# Patient Record
Sex: Female | Born: 1975 | Race: Black or African American | Hispanic: No | Marital: Married | Smoking: Current every day smoker
Health system: Southern US, Community
[De-identification: ages and names within clinical notes are randomized; demographics above are authoritative.]

## PROBLEM LIST (undated history)

## (undated) DIAGNOSIS — J45909 Unspecified asthma, uncomplicated: Secondary | ICD-10-CM

## (undated) DIAGNOSIS — Z8669 Personal history of other diseases of the nervous system and sense organs: Secondary | ICD-10-CM

## (undated) DIAGNOSIS — I110 Hypertensive heart disease with heart failure: Secondary | ICD-10-CM

## (undated) DIAGNOSIS — Z720 Tobacco use: Secondary | ICD-10-CM

## (undated) DIAGNOSIS — J449 Chronic obstructive pulmonary disease, unspecified: Secondary | ICD-10-CM

## (undated) DIAGNOSIS — F329 Major depressive disorder, single episode, unspecified: Secondary | ICD-10-CM

## (undated) DIAGNOSIS — G4733 Obstructive sleep apnea (adult) (pediatric): Secondary | ICD-10-CM

## (undated) DIAGNOSIS — L03115 Cellulitis of right lower limb: Secondary | ICD-10-CM

## (undated) DIAGNOSIS — I5042 Chronic combined systolic (congestive) and diastolic (congestive) heart failure: Secondary | ICD-10-CM

## (undated) DIAGNOSIS — N184 Chronic kidney disease, stage 4 (severe): Secondary | ICD-10-CM

## (undated) DIAGNOSIS — F32A Depression, unspecified: Secondary | ICD-10-CM

## (undated) DIAGNOSIS — D649 Anemia, unspecified: Secondary | ICD-10-CM

## (undated) DIAGNOSIS — Z9981 Dependence on supplemental oxygen: Secondary | ICD-10-CM

## (undated) HISTORY — DX: Chronic kidney disease, stage 4 (severe): N18.4

## (undated) HISTORY — DX: Hypertensive heart disease with heart failure: I11.0

## (undated) HISTORY — DX: Tobacco use: Z72.0

## (undated) HISTORY — PX: NO PAST SURGERIES: SHX2092

## (undated) HISTORY — DX: Personal history of other diseases of the nervous system and sense organs: Z86.69

## (undated) HISTORY — DX: Chronic combined systolic (congestive) and diastolic (congestive) heart failure: I50.42

---

## 2014-05-26 ENCOUNTER — Emergency Department (HOSPITAL_COMMUNITY)
Admission: EM | Admit: 2014-05-26 | Discharge: 2014-05-26 | Disposition: A | Discharge status: Home / Self Care | Payer: No Typology Code available for payment source | Attending: Emergency Medicine | Admitting: Emergency Medicine

## 2014-05-26 ENCOUNTER — Emergency Department (HOSPITAL_COMMUNITY): Payer: No Typology Code available for payment source

## 2014-05-26 ENCOUNTER — Encounter (HOSPITAL_COMMUNITY): Payer: Self-pay | Admitting: Emergency Medicine

## 2014-05-26 DIAGNOSIS — J159 Unspecified bacterial pneumonia: Secondary | ICD-10-CM | POA: Insufficient documentation

## 2014-05-26 DIAGNOSIS — N189 Chronic kidney disease, unspecified: Secondary | ICD-10-CM | POA: Diagnosis not present

## 2014-05-26 DIAGNOSIS — R059 Cough, unspecified: Secondary | ICD-10-CM

## 2014-05-26 DIAGNOSIS — I129 Hypertensive chronic kidney disease with stage 1 through stage 4 chronic kidney disease, or unspecified chronic kidney disease: Secondary | ICD-10-CM | POA: Insufficient documentation

## 2014-05-26 DIAGNOSIS — R05 Cough: Secondary | ICD-10-CM | POA: Diagnosis present

## 2014-05-26 DIAGNOSIS — Z8739 Personal history of other diseases of the musculoskeletal system and connective tissue: Secondary | ICD-10-CM | POA: Diagnosis not present

## 2014-05-26 DIAGNOSIS — H748X1 Other specified disorders of right middle ear and mastoid: Secondary | ICD-10-CM | POA: Insufficient documentation

## 2014-05-26 DIAGNOSIS — J189 Pneumonia, unspecified organism: Secondary | ICD-10-CM

## 2014-05-26 DIAGNOSIS — Z720 Tobacco use: Secondary | ICD-10-CM | POA: Insufficient documentation

## 2014-05-26 MED ORDER — AZITHROMYCIN 250 MG PO TABS: 250.0000 mg | ORAL_TABLET | Freq: Every day | ORAL | Status: DC

## 2014-05-26 MED ORDER — ALBUTEROL SULFATE HFA 108 (90 BASE) MCG/ACT IN AERS
1.0000 | INHALATION_SPRAY | RESPIRATORY_TRACT | Status: DC | PRN
  Administered 2014-05-26: 2 via RESPIRATORY_TRACT
  Filled 2014-05-26: qty 6.7

## 2014-05-26 MED ORDER — ACETAMINOPHEN 325 MG PO TABS
650.0000 mg | ORAL_TABLET | Freq: Once | ORAL | Status: AC
  Administered 2014-05-26: 650 mg via ORAL
  Filled 2014-05-26: qty 2

## 2014-05-26 NOTE — ED Notes (Signed)
Patient coming from home with c/o of fever, cough, diaphoretic, and SOB x 2 days.  Patient states she has been taking care of husband at bedside who was diagnosed with pnuemonia.

## 2014-05-26 NOTE — ED Provider Notes (Signed)
CSN: [id number]     Arrival date & time 05/26/14  0549 History   First MD Initiated Contact with Patient 05/26/14 0559     Chief Complaint  Patient presents with  . Influenza     (Consider location/radiation/quality/duration/timing/severity/associated sxs/prior Treatment) Patient is a 39 y.o. female presenting with flu symptoms. The history is provided by the patient and medical records.  Influenza Presenting symptoms: cough and shortness of breath     This is a 39 y.o. F with PMH significant for HTN, CKD, arthritis, presenting to the ED for complaint of productive cough, subjective fever, and mild, intermittent SOB for the past 2 days.  Patient's husband was recently diagnosed with CAP 2 days ago and she feels she picked it up from him.  She denies any chest pain.  She denies abdominal pain, nausea, vomiting, or diarrhea.  Patient also admits that she has not taken her BP meds in 2 days because she was not feeling well.  States now she has a headache.  Denies dizziness, lightheadedness, numbness, paresthesias, or focal weakness.    Past Medical History  Diagnosis Date  . Hypertension   . Renal disorder   . Arthritis    History reviewed. No pertinent past surgical history. No family history on file. History  Substance Use Topics  . Smoking status: Current Every Day Smoker  . Smokeless tobacco: Not on file  . Alcohol Use: No   OB History    No data available     Review of Systems  Respiratory: Positive for cough and shortness of breath.   All other systems reviewed and are negative.     Allergies  Review of patient's allergies indicates no known allergies.  Home Medications   Prior to Admission medications   Not on File   BP 171/109 mmHg  Pulse 105  Temp(Src) 99.1 F (37.3 C) (Oral)  Resp 15  Ht  (1.626 m)  Wt 210 lb (95.255 kg)  BMI 36.03 kg/m2  SpO2 98%  LMP 05/25/2014   Physical Exam  Constitutional: She is oriented to person, place, and time. She  appears well-developed and well-nourished. No distress.  HENT:  Head: Normocephalic and atraumatic.  Right Ear: Ear canal normal. A middle ear effusion is present.  Left Ear: Tympanic membrane and ear canal normal.  Nose: Mucosal edema and rhinorrhea (clear) present.  Mouth/Throat: Uvula is midline, oropharynx is clear and moist and mucous membranes are normal. No oropharyngeal exudate, posterior oropharyngeal edema, posterior oropharyngeal erythema or tonsillar abscesses.  Eyes: Conjunctivae and EOM are normal. Pupils are equal, round, and reactive to light.  Neck: Normal range of motion and full passive range of motion without pain. Neck supple. No rigidity.  No nuchal rigidity, no meningismus  Cardiovascular: Normal rate, regular rhythm and normal heart sounds.   Pulmonary/Chest: Effort normal and breath sounds normal. No respiratory distress. She has no wheezes.  Abdominal: Soft. Bowel sounds are normal. There is no tenderness. There is no guarding.  Musculoskeletal: Normal range of motion. She exhibits no edema.  Neurological: She is alert and oriented to person, place, and time.  AAOx3, answering questions appropriately; equal strength UE and LE bilaterally; CN grossly intact; moves all extremities appropriately without ataxia; no focal neuro deficits or facial asymmetry appreciated  Skin: Skin is warm and dry. She is not diaphoretic.  Psychiatric: She has a normal mood and affect.  Nursing note and vitals reviewed.   ED Course  Procedures (including critical care time) Labs Review  Labs Reviewed - No data to display  Imaging Review Dg Chest 2 View  05/26/2014   CLINICAL DATA:  Fever, nonproductive cough, chest pain for 2 days. History of hypertension renal disorder.  EXAM: CHEST  2 VIEW  COMPARISON:  None.  FINDINGS: The cardiac silhouette is upper limits of normal in size, mediastinal silhouette is nonsuspicious. Patchy RIGHT middle lobe airspace opacity without pleural effusion.  No pneumothorax. Soft tissue planes and included osseous structures are nonsuspicious.  IMPRESSION: RIGHT middle lobe pneumonia.  Mild cardiomegaly.   Electronically Signed   By: Awilda Metroourtnay  Bloomer   On: 05/26/2014 06:53     EKG Interpretation None      MDM   Final diagnoses:  Cough  CAP (community acquired pneumonia)   39 year old female with complaint of cough and subjective fever at home.  No chest pain.  Her husband was recently diagnosed with pneumonia and she is concerned for the same. Also has headache, has not taken BP meds in 2 days due to not feeling well.  She is afebrile and nontoxic on exam. Headache without focal deficits, doubt acute intracranial pathology.  Her lung sounds are overall clear and she is in no acute respiratory distress. She denies any chest pain. Chest x-ray does reveal right middle lobe pneumonia.  She'll be started on azithromycin and given an albuterol inhaler for rescue. Will use home nebulizers every 4 hours as needed for SOB/wheezing.  Strongly recommended to take her BP meds as directed. Discussed plan with patient, he/she acknowledged understanding and agreed with plan of care.  Return precautions given for new or worsening symptoms.  Garlon HatchetLisa M Sanders, PA-C 05/26/14 [id number]  Marisa Severinlga Otter, MD 05/26/14 1910

## 2014-05-26 NOTE — ED Notes (Signed)
Patient alert and oriented at discharge.  Patient advised to follow up with primary care physician regarding blood pressure and to take her medication as prescribed.

## 2014-05-26 NOTE — Discharge Instructions (Signed)
Take the prescribed medication as directed.  Use nebulizer as needed every 4 hours for shortness of breath/wheezing.  Use inhaler for rescue. Return to the ED for new or worsening symptoms.

## 2014-08-20 ENCOUNTER — Encounter (HOSPITAL_COMMUNITY): Payer: Self-pay | Admitting: Family Medicine

## 2014-08-20 ENCOUNTER — Emergency Department (HOSPITAL_COMMUNITY)
Admission: EM | Admit: 2014-08-20 | Discharge: 2014-08-20 | Disposition: A | Discharge status: Home / Self Care | Payer: No Typology Code available for payment source | Attending: Emergency Medicine | Admitting: Emergency Medicine

## 2014-08-20 DIAGNOSIS — J45909 Unspecified asthma, uncomplicated: Secondary | ICD-10-CM | POA: Insufficient documentation

## 2014-08-20 DIAGNOSIS — J45901 Unspecified asthma with (acute) exacerbation: Secondary | ICD-10-CM | POA: Insufficient documentation

## 2014-08-20 DIAGNOSIS — I10 Essential (primary) hypertension: Secondary | ICD-10-CM

## 2014-08-20 DIAGNOSIS — Z79899 Other long term (current) drug therapy: Secondary | ICD-10-CM | POA: Insufficient documentation

## 2014-08-20 DIAGNOSIS — Z720 Tobacco use: Secondary | ICD-10-CM | POA: Insufficient documentation

## 2014-08-20 DIAGNOSIS — Z8739 Personal history of other diseases of the musculoskeletal system and connective tissue: Secondary | ICD-10-CM | POA: Diagnosis not present

## 2014-08-20 DIAGNOSIS — Z87448 Personal history of other diseases of urinary system: Secondary | ICD-10-CM | POA: Insufficient documentation

## 2014-08-20 DIAGNOSIS — R079 Chest pain, unspecified: Secondary | ICD-10-CM

## 2014-08-20 DIAGNOSIS — Z792 Long term (current) use of antibiotics: Secondary | ICD-10-CM | POA: Diagnosis not present

## 2014-08-20 DIAGNOSIS — R0602 Shortness of breath: Secondary | ICD-10-CM | POA: Diagnosis present

## 2014-08-20 HISTORY — DX: Unspecified asthma, uncomplicated: J45.909

## 2014-08-20 MED ORDER — PREDNISONE 20 MG PO TABS
60.0000 mg | ORAL_TABLET | Freq: Once | ORAL | Status: AC
  Administered 2014-08-20: 60 mg via ORAL
  Filled 2014-08-20: qty 3

## 2014-08-20 MED ORDER — IPRATROPIUM-ALBUTEROL 0.5-2.5 (3) MG/3ML IN SOLN
RESPIRATORY_TRACT | Status: AC
  Filled 2014-08-20: qty 3

## 2014-08-20 MED ORDER — ALBUTEROL SULFATE (2.5 MG/3ML) 0.083% IN NEBU
5.0000 mg | INHALATION_SOLUTION | Freq: Once | RESPIRATORY_TRACT | Status: AC
  Administered 2014-08-20: 5 mg via RESPIRATORY_TRACT

## 2014-08-20 MED ORDER — LISINOPRIL 20 MG PO TABS: 20.0000 mg | ORAL_TABLET | Freq: Every day | ORAL | Status: DC

## 2014-08-20 MED ORDER — PREDNISONE 50 MG PO TABS: ORAL_TABLET | ORAL | Status: DC

## 2014-08-20 NOTE — ED Provider Notes (Signed)
CSN: [id number]     Arrival date & time 08/20/14  1431 History  This chart was scribed for Zadie Rhine, MD by Leone Payor, ED Scribe. This patient was seen in room TR02C/TR02C and the patient's care was started 3:25 PM.     Chief Complaint  Patient presents with  . Asthma   The history is provided by the patient. No language interpreter was used.     HPI Comments: April [name] is a 39 y.o. female with past medical history of asthma and HTN who presents to the Emergency Department complaining of constant, unchanged SOB with associated cough that began yesterday. She reports similar asthma-related episodes in the past. She has used her inhaler without significant relief. Patient received a breathing treatment in the ED which provided relief. She reports having a history of HTN but has run out of her prescribed Lisinopril. She denies chest pain, fever, HA.   Past Medical History  Diagnosis Date  . Hypertension   . Renal disorder   . Arthritis   . Asthma    History reviewed. No pertinent past surgical history. History reviewed. No pertinent family history. Social History  Substance Use Topics  . Smoking status: Current Every Day Smoker  . Smokeless tobacco: None  . Alcohol Use: No   OB History    No data available     Review of Systems  Constitutional: Negative for fever.  Respiratory: Positive for cough and shortness of breath.   Cardiovascular: Negative for chest pain and leg swelling.  Neurological: Negative for headaches.  All other systems reviewed and are negative.     Allergies  Review of patient's allergies indicates no known allergies.  Home Medications   Prior to Admission medications   Medication Sig Start Date End Date Taking? Authorizing Provider  albuterol (PROVENTIL HFA;VENTOLIN HFA) 108 (90 BASE) MCG/ACT inhaler Inhale 1 puff into the lungs every 6 (six) hours as needed for wheezing or shortness of breath.    Historical Provider, MD  AMLODIPINE  BESYLATE PO Take 1 tablet by mouth daily.    Historical Provider, MD  azithromycin (ZITHROMAX) 250 MG tablet Take 1 tablet (250 mg total) by mouth daily. Take first 2 tablets together, then 1 every day until finished. 05/26/14   Garlon Hatchet, PA-C  BENAZEPRIL HCL PO Take 1 tablet by mouth daily.    Historical Provider, MD  LISINOPRIL PO Take 1 tablet by mouth daily.    Historical Provider, MD   BP 196/131 mmHg  Pulse 95  Temp(Src) 97.8 F (36.6 C)  Resp 20  SpO2 98%  LMP 08/18/2014 Physical Exam  Nursing note and vitals reviewed.  CONSTITUTIONAL: Well developed/well nourished HEAD: Normocephalic/atraumatic EYES: EOMI/PERRL ENMT: Mucous membranes moist NECK: supple no meningeal signs CV: S1/S2 noted, no murmurs/rubs/gallops noted LUNGS: Lungs are clear to auscultation bilaterally, no apparent distress ABDOMEN: soft, nontender, no rebound or guarding, bowel sounds noted throughout abdomen NEURO: Pt is awake/alert/appropriate, moves all extremitiesx4.  No facial droop.   EXTREMITIES: pulses normal/equal, full ROM, no LE edema noted.  SKIN: warm, color normal PSYCH: no abnormalities of mood noted, alert and oriented to situation   ED Course  Procedures   DIAGNOSTIC STUDIES: Oxygen Saturation is 98% on RA, normal by my interpretation.    COORDINATION OF CARE: 3:30 PM Will discharge home with prednisone and lisinopril. Discussed treatment plan with pt at bedside and pt agreed to plan.   Pt reports symptoms similar to prior episodes of asthma She is improved and  wheeze resolved No edema or wt gain present to suggest CHF  Advised need take her BP meds and she was given Rx for lisinopril (she is supposed to take this) Advised need for outpatient f/u   MDM   Final diagnoses:  Asthma attack  Essential hypertension    Nursing notes including past medical history and social history reviewed and considered in documentation   I personally performed the services described in  this documentation, which was scribed in my presence. The recorded information has been reviewed and is accurate.        Zadie Rhine, MD 08/20/14 [phone]

## 2014-08-20 NOTE — ED Notes (Signed)
Pt here for SOB related to asthma. sts cough. Pt hypertensive at triage. sts she hasn't had her medication in a few months.

## 2014-09-03 ENCOUNTER — Ambulatory Visit: Payer: No Typology Code available for payment source | Attending: Family Medicine | Admitting: Family Medicine

## 2014-09-03 ENCOUNTER — Encounter: Payer: Self-pay | Admitting: Family Medicine

## 2014-09-03 VITALS — BP 181/137 | HR 90 | Temp 98.0°F | Ht 63.0 in | Wt 196.0 lb

## 2014-09-03 DIAGNOSIS — I129 Hypertensive chronic kidney disease with stage 1 through stage 4 chronic kidney disease, or unspecified chronic kidney disease: Secondary | ICD-10-CM | POA: Diagnosis not present

## 2014-09-03 DIAGNOSIS — F17210 Nicotine dependence, cigarettes, uncomplicated: Secondary | ICD-10-CM | POA: Insufficient documentation

## 2014-09-03 DIAGNOSIS — N189 Chronic kidney disease, unspecified: Secondary | ICD-10-CM | POA: Diagnosis not present

## 2014-09-03 DIAGNOSIS — I5042 Chronic combined systolic (congestive) and diastolic (congestive) heart failure: Secondary | ICD-10-CM | POA: Diagnosis not present

## 2014-09-03 DIAGNOSIS — I10 Essential (primary) hypertension: Secondary | ICD-10-CM | POA: Diagnosis not present

## 2014-09-03 DIAGNOSIS — N183 Chronic kidney disease, stage 3 unspecified: Secondary | ICD-10-CM

## 2014-09-03 DIAGNOSIS — J45909 Unspecified asthma, uncomplicated: Secondary | ICD-10-CM | POA: Diagnosis not present

## 2014-09-03 DIAGNOSIS — I509 Heart failure, unspecified: Secondary | ICD-10-CM | POA: Insufficient documentation

## 2014-09-03 DIAGNOSIS — J4540 Moderate persistent asthma, uncomplicated: Secondary | ICD-10-CM

## 2014-09-03 DIAGNOSIS — Z79899 Other long term (current) drug therapy: Secondary | ICD-10-CM | POA: Diagnosis not present

## 2014-09-03 LAB — COMPREHENSIVE METABOLIC PANEL
ALBUMIN: 4.2 g/dL (ref 3.6–5.1)
ALK PHOS: 68 U/L (ref 33–115)
ALT: 14 U/L (ref 6–29)
AST: 22 U/L (ref 10–30)
BILIRUBIN TOTAL: 0.5 mg/dL (ref 0.2–1.2)
BUN: 12 mg/dL (ref 7–25)
CALCIUM: 9.1 mg/dL (ref 8.6–10.2)
CO2: 24 mmol/L (ref 20–31)
Chloride: 104 mmol/L (ref 98–110)
Creat: 1.72 mg/dL — ABNORMAL HIGH (ref 0.50–1.10)
Glucose, Bld: 79 mg/dL (ref 65–99)
Potassium: 5 mmol/L (ref 3.5–5.3)
Sodium: 140 mmol/L (ref 135–146)
Total Protein: 7 g/dL (ref 6.1–8.1)

## 2014-09-03 MED ORDER — CLONIDINE HCL 0.1 MG PO TABS
0.1000 mg | ORAL_TABLET | Freq: Once | ORAL | Status: AC
  Administered 2014-09-03: 0.1 mg via ORAL

## 2014-09-03 MED ORDER — CARVEDILOL 12.5 MG PO TABS: 12.5000 mg | ORAL_TABLET | Freq: Two times a day (BID) | ORAL | Status: DC

## 2014-09-03 MED ORDER — FLUTICASONE-SALMETEROL 250-50 MCG/DOSE IN AEPB: 1.0000 | INHALATION_SPRAY | Freq: Two times a day (BID) | RESPIRATORY_TRACT | Status: DC

## 2014-09-03 NOTE — Patient Instructions (Signed)

## 2014-09-03 NOTE — Progress Notes (Signed)
Patient here to establish care She recently seen in ED for asthma attach She states her nebulizer machine is no longer helping her She needs refills on medications today

## 2014-09-03 NOTE — Progress Notes (Signed)
April [name], is a 39 y.o. female  ZOX:[id number]  [id number]  DOB - [birth year]  CC:  Chief Complaint  Patient presents with  . Follow-up  . Asthma       HPI: April [name] is a 39 y.o. female here today to establish medical care. Past medical history is notable for hypertension, chronic kidney disease, asthma, CHF and she recently relocated from [place], [place]. She had presented to the ED with shortness of breath and a cough for which she received a breathing treatment with improvement in symptoms. Her blood pressure was also noted to be significantly elevated at 196/131 as she had run out of her antihypertensives and had received a refill on medications.  Today she reports taking her antihypertensives but her blood pressure is still elevated; she also complains of shortness of breath which is not improved despite her compliance with Qvar and her rescue inhaler. She endorses occasional pedal edema which is absent at this time. She informs me that back in [place] she was seeing a nephrologist and a cardiologist.  Patient has No headache, No chest pain, No abdominal pain - No Nausea, No new weakness tingling or numbness.  No Known Allergies Past Medical History  Diagnosis Date  . Hypertension   . Renal disorder   . Arthritis   . Asthma    Current Outpatient Prescriptions on File Prior to Visit  Medication Sig Dispense Refill  . albuterol (PROVENTIL HFA;VENTOLIN HFA) 108 (90 BASE) MCG/ACT inhaler Inhale 1 puff into the lungs every 6 (six) hours as needed for wheezing or shortness of breath.    . AMLODIPINE BESYLATE PO Take 1 tablet by mouth daily.    Marland Kitchen BENAZEPRIL HCL PO Take 1 tablet by mouth daily.    Marland Kitchen lisinopril (PRINIVIL,ZESTRIL) 20 MG tablet Take 1 tablet (20 mg total) by mouth daily. 30 tablet 0  . predniSONE (DELTASONE) 50 MG tablet One tablet PO daily for 4 days 4 tablet 0   No current facility-administered medications on file prior to visit.    Family History  Problem Relation Age of Onset  . Diabetes Mother   . Hypertension Mother   . Cancer Father    Social History   Social History  . Marital Status: Married    Spouse Name: N/A  . Number of Children: N/A  . Years of Education: N/A   Occupational History  . Not on file.   Social History Main Topics  . Smoking status: Current Every Day Smoker -- 0.50 packs/day  . Smokeless tobacco: Not on file  . Alcohol Use: No  . Drug Use: No  . Sexual Activity: Not on file   Other Topics Concern  . Not on file   Social History Narrative    Review of Systems: Constitutional: Negative for fever, chills, diaphoresis, activity change, appetite change and fatigue. HENT: Negative for ear pain, nosebleeds, congestion, facial swelling, rhinorrhea, neck pain, neck stiffness and ear discharge.  Eyes: Negative for pain, discharge, redness, itching and visual disturbance. Respiratory: Positive for cough and shortness of breath, negative for choking, chest tightness, wheezing and stridor.  Cardiovascular: Negative for chest pain, palpitations and leg swelling. Gastrointestinal: Negative for abdominal distention. Genitourinary: Negative for dysuria, urgency, frequency, hematuria, flank pain, decreased urine volume, difficulty urinating and dyspareunia.  Musculoskeletal: Negative for back pain, joint swelling, arthralgia and gait problem. Neurological: Negative for dizziness, tremors, seizures, syncope, facial asymmetry, speech difficulty, weakness, light-headedness, numbness and headaches.  Hematological: Negative for adenopathy. Does not bruise/bleed easily. Skin:  Negative for rash, ulcer. Psychiatric/Behavioral: Negative for hallucinations, behavioral problems, confusion, dysphoric mood, decreased concentration and agitation.    Objective:   Filed Vitals:   09/03/14 1556  BP: 175/125  Pulse: 90  Temp: 98 F (36.7 C)    Physical Exam: Constitutional: Patient appears  well-developed and well-nourished. No distress. HENT: Normocephalic, atraumatic, External right and left ear normal. Oropharynx is clear and moist.  Eyes: Conjunctivae and EOM are normal. PERRLA, no scleral icterus. Neck: Normal ROM, No JVD. No tracheal deviation. No thyromegaly. CVS: RRR, S1/S2 +, no murmurs, no gallops, no carotid bruit.  Pulmonary: Effort and breath sounds normal, no stridor, rhonchi, wheezes, rales.  Abdominal: Soft. BS +, no distension, tenderness, rebound or guarding.  Musculoskeletal: Normal range of motion. No edema and no tenderness.  Lymphadenopathy: No lymphadenopathy noted, cervical, inguinal or axillary Neuro: Alert. Normal reflexes, muscle tone coordination. No cranial nerve deficit. Skin: Skin is warm and dry. No rash noted. Not diaphoretic. No erythema. No pallor. Psychiatric: Normal mood and affect. Behavior, judgment, thought content normal.     Assessment and plan:  39 year old female with history of hypertension, asthma, CKD, CHF who recently relocated to [facility] and is here to establish care.  Uncontrolled hypertension: Clonidine 0.1 mg given, patient observed for 20 minutes and repeat blood pressure performed Coreg added to regimen. Low-sodium, DASH diet as well as last modifications.  Asthma: Uncontrolled. Will upgrade treatment from Qvar to Advair  CHF: Pro BNP today and will determine need for Lasix 2d echo to be ordered at next visit Heart healthy, low-sodium diet; restrict fluids to less than 2 L a day.  Will need to obtain previous medical records from [place]  CKD: Avoid nephrotoxic agents. We will monitor creatinine.   The patient was given clear instructions to go to ER or return to medical center if symptoms don't improve, worsen or new problems develop. The patient verbalized understanding. The patient was told to call to get lab results if they haven't heard anything in the next week.     Jaclyn Shaggy, MD. [facility] [phone] 09/03/2014, 4:11 PM

## 2014-09-04 ENCOUNTER — Telehealth: Payer: Self-pay | Admitting: *Deleted

## 2014-09-04 ENCOUNTER — Other Ambulatory Visit: Payer: Self-pay | Admitting: Family Medicine

## 2014-09-04 DIAGNOSIS — J45909 Unspecified asthma, uncomplicated: Secondary | ICD-10-CM | POA: Insufficient documentation

## 2014-09-04 LAB — PRO B NATRIURETIC PEPTIDE: Pro B Natriuretic peptide (BNP): 3301 pg/mL — ABNORMAL HIGH (ref <126)

## 2014-09-04 MED ORDER — FUROSEMIDE 40 MG PO TABS: 40.0000 mg | ORAL_TABLET | Freq: Every day | ORAL | Status: DC

## 2014-09-04 NOTE — Telephone Encounter (Signed)
-----   Message from Enobong Amao, MD sent at 09/04/2014  8:17 AM EDT ----- Labs reveal elevated BNP from CHF exacerbation which explains her shortness of breath; I have added Lasix to her regimen. 

## 2014-09-04 NOTE — Telephone Encounter (Signed)
Left HIPAA compliant message for patient to return my call at [phone] 

## 2014-09-06 ENCOUNTER — Encounter: Payer: Self-pay | Admitting: *Deleted

## 2014-09-06 ENCOUNTER — Telehealth: Payer: Self-pay | Admitting: Family Medicine

## 2014-09-06 ENCOUNTER — Telehealth: Payer: Self-pay | Admitting: *Deleted

## 2014-09-06 NOTE — Telephone Encounter (Signed)
-----   Message from Jaclyn Shaggy, MD sent at 09/04/2014  8:17 AM EDT ----- Labs reveal elevated BNP from CHF exacerbation which explains her shortness of breath; I have added Lasix to her regimen.

## 2014-09-06 NOTE — Telephone Encounter (Signed)
Second attempt to contact patient with lab results.  Letter mailed to patient.

## 2014-09-06 NOTE — Telephone Encounter (Signed)
Patient returned nurses phone call. Patient is aware that letter was mailed.

## 2014-09-11 ENCOUNTER — Other Ambulatory Visit: Payer: Self-pay | Admitting: *Deleted

## 2014-09-11 ENCOUNTER — Telehealth: Payer: Self-pay | Admitting: *Deleted

## 2014-09-11 MED ORDER — FUROSEMIDE 40 MG PO TABS: 40.0000 mg | ORAL_TABLET | Freq: Every day | ORAL | Status: DC

## 2014-09-11 NOTE — Telephone Encounter (Signed)
Left message for patient to return my call-she had called asking for her results

## 2014-09-17 ENCOUNTER — Ambulatory Visit: Payer: No Typology Code available for payment source | Admitting: Family Medicine

## 2014-10-16 ENCOUNTER — Emergency Department (HOSPITAL_COMMUNITY): Payer: No Typology Code available for payment source

## 2014-10-16 ENCOUNTER — Encounter (HOSPITAL_COMMUNITY): Payer: Self-pay | Admitting: *Deleted

## 2014-10-16 ENCOUNTER — Emergency Department (HOSPITAL_COMMUNITY)
Admission: EM | Admit: 2014-10-16 | Discharge: 2014-10-16 | Disposition: A | Discharge status: Home / Self Care | Payer: No Typology Code available for payment source | Attending: Emergency Medicine | Admitting: Emergency Medicine

## 2014-10-16 DIAGNOSIS — Z8739 Personal history of other diseases of the musculoskeletal system and connective tissue: Secondary | ICD-10-CM | POA: Insufficient documentation

## 2014-10-16 DIAGNOSIS — Z7952 Long term (current) use of systemic steroids: Secondary | ICD-10-CM | POA: Diagnosis not present

## 2014-10-16 DIAGNOSIS — R0602 Shortness of breath: Secondary | ICD-10-CM | POA: Diagnosis present

## 2014-10-16 DIAGNOSIS — Z7951 Long term (current) use of inhaled steroids: Secondary | ICD-10-CM | POA: Diagnosis not present

## 2014-10-16 DIAGNOSIS — J45901 Unspecified asthma with (acute) exacerbation: Secondary | ICD-10-CM | POA: Insufficient documentation

## 2014-10-16 DIAGNOSIS — Z720 Tobacco use: Secondary | ICD-10-CM | POA: Diagnosis not present

## 2014-10-16 DIAGNOSIS — Z79899 Other long term (current) drug therapy: Secondary | ICD-10-CM | POA: Diagnosis not present

## 2014-10-16 DIAGNOSIS — G43909 Migraine, unspecified, not intractable, without status migrainosus: Secondary | ICD-10-CM | POA: Insufficient documentation

## 2014-10-16 DIAGNOSIS — G43009 Migraine without aura, not intractable, without status migrainosus: Secondary | ICD-10-CM

## 2014-10-16 DIAGNOSIS — N189 Chronic kidney disease, unspecified: Secondary | ICD-10-CM | POA: Diagnosis not present

## 2014-10-16 DIAGNOSIS — I159 Secondary hypertension, unspecified: Secondary | ICD-10-CM | POA: Insufficient documentation

## 2014-10-16 LAB — CBC WITH DIFFERENTIAL/PLATELET
BASOS PCT: 1 %
Basophils Absolute: 0.1 10*3/uL (ref 0.0–0.1)
EOS ABS: 0.3 10*3/uL (ref 0.0–0.7)
Eosinophils Relative: 4 %
HCT: 37.9 % (ref 36.0–46.0)
HEMOGLOBIN: 12.7 g/dL (ref 12.0–15.0)
Lymphocytes Relative: 28 %
Lymphs Abs: 1.9 10*3/uL (ref 0.7–4.0)
MCH: 29.8 pg (ref 26.0–34.0)
MCHC: 33.5 g/dL (ref 30.0–36.0)
MCV: 89 fL (ref 78.0–100.0)
MONOS PCT: 6 %
Monocytes Absolute: 0.4 10*3/uL (ref 0.1–1.0)
Neutro Abs: 4.1 10*3/uL (ref 1.7–7.7)
Neutrophils Relative %: 61 %
PLATELETS: 247 10*3/uL (ref 150–400)
RBC: 4.26 MIL/uL (ref 3.87–5.11)
RDW: 13 % (ref 11.5–15.5)
WBC: 6.8 10*3/uL (ref 4.0–10.5)

## 2014-10-16 LAB — BASIC METABOLIC PANEL
Anion gap: 16 — ABNORMAL HIGH (ref 5–15)
BUN: 13 mg/dL (ref 6–20)
CHLORIDE: 91 mmol/L — AB (ref 101–111)
CO2: 27 mmol/L (ref 22–32)
Calcium: 9.2 mg/dL (ref 8.9–10.3)
Creatinine, Ser: 1.89 mg/dL — ABNORMAL HIGH (ref 0.44–1.00)
GFR, EST AFRICAN AMERICAN: 38 mL/min — AB (ref >60)
GFR, EST NON AFRICAN AMERICAN: 32 mL/min — AB (ref >60)
Glucose, Bld: 88 mg/dL (ref 65–99)
Potassium: 3.3 mmol/L — ABNORMAL LOW (ref 3.5–5.1)
SODIUM: 134 mmol/L — AB (ref 135–145)

## 2014-10-16 LAB — I-STAT TROPONIN, ED: Troponin i, poc: 0 ng/mL (ref 0.00–0.08)

## 2014-10-16 LAB — I-STAT BETA HCG BLOOD, ED (MC, WL, AP ONLY)

## 2014-10-16 MED ORDER — ALBUTEROL SULFATE HFA 108 (90 BASE) MCG/ACT IN AERS: 2.0000 | INHALATION_SPRAY | RESPIRATORY_TRACT | Status: AC | PRN

## 2014-10-16 MED ORDER — PREDNISONE 20 MG PO TABS: 60.0000 mg | ORAL_TABLET | Freq: Every day | ORAL | Status: DC

## 2014-10-16 MED ORDER — METHYLPREDNISOLONE SODIUM SUCC 125 MG IJ SOLR
125.0000 mg | Freq: Once | INTRAMUSCULAR | Status: AC
  Administered 2014-10-16: 125 mg via INTRAVENOUS
  Filled 2014-10-16: qty 2

## 2014-10-16 MED ORDER — ONDANSETRON HCL 4 MG/2ML IJ SOLN
4.0000 mg | Freq: Once | INTRAMUSCULAR | Status: AC
  Administered 2014-10-16: 4 mg via INTRAVENOUS
  Filled 2014-10-16: qty 2

## 2014-10-16 MED ORDER — SODIUM CHLORIDE 0.9 % IV BOLUS (SEPSIS)
1000.0000 mL | Freq: Once | INTRAVENOUS | Status: AC
  Administered 2014-10-16: 1000 mL via INTRAVENOUS

## 2014-10-16 MED ORDER — BUTALBITAL-APAP-CAFFEINE 50-325-40 MG PO TABS: 1.0000 | ORAL_TABLET | Freq: Four times a day (QID) | ORAL | Status: AC | PRN

## 2014-10-16 MED ORDER — KETOROLAC TROMETHAMINE 30 MG/ML IJ SOLN
30.0000 mg | Freq: Once | INTRAMUSCULAR | Status: AC
  Administered 2014-10-16: 30 mg via INTRAVENOUS
  Filled 2014-10-16: qty 1

## 2014-10-16 MED ORDER — PROMETHAZINE HCL 25 MG PO TABS: 25.0000 mg | ORAL_TABLET | Freq: Four times a day (QID) | ORAL | Status: DC | PRN

## 2014-10-16 MED ORDER — METOCLOPRAMIDE HCL 5 MG/ML IJ SOLN
10.0000 mg | Freq: Once | INTRAMUSCULAR | Status: AC
  Administered 2014-10-16: 10 mg via INTRAVENOUS
  Filled 2014-10-16: qty 2

## 2014-10-16 MED ORDER — DIPHENHYDRAMINE HCL 50 MG/ML IJ SOLN
50.0000 mg | Freq: Once | INTRAMUSCULAR | Status: AC
  Administered 2014-10-16: 50 mg via INTRAVENOUS
  Filled 2014-10-16: qty 1

## 2014-10-16 NOTE — ED Notes (Signed)
Ambulated pt in hallway. Pt started out at 92% while ambulating pt's O2 dropped to 87-88% and picked back up to 93%. Dr. Elesa Massed was notified.

## 2014-10-16 NOTE — ED Provider Notes (Signed)
TIME SEEN: 4:00 AM  CHIEF COMPLAINT: Migraine, hypertension, asthma  HPI: Pt is a 39 y.o. female with history of hypertension, migraine headaches, asthma, chronic kidney disease who presents in the emergency department with complaints of 2 weeks of shortness of breath, wheezing. No chest pain or cough. No fever. Feels like her prior asthma exacerbations. Has been using her albuterol nebulizer and albuterol inhaler without relief. Has not recently been on any steroids.   States tonight at midnight she woke up with a diffuse throbbing headache that feels similar to her prior migraines. Denies that it was sudden onset, thunderclap. Denies it is the worst headache of her life. Denies numbness, tingling or focal weakness. Denies vision changes. States that often causes her blood pressure to be elevated. She is on multiple blood pressure medications and states she has been taking them all. Last exam yesterday morning. No chest pain, numbness, tingling or focal weakness. She denies any head injury. Not on anticoagulation. States normally sleeping helps her headaches go away. She does have photophobia and nausea.   ROS: See HPI Constitutional: no fever  Eyes: no drainage  ENT: no runny nose   Cardiovascular:  no chest pain  Resp: no SOB  GI: no vomiting GU: no dysuria Integumentary: no rash  Allergy: no hives  Musculoskeletal: no leg swelling  Neurological: no slurred speech ROS otherwise negative  PAST MEDICAL HISTORY/PAST SURGICAL HISTORY:  Past Medical History  Diagnosis Date  . Hypertension   . Renal disorder   . Arthritis   . Asthma     MEDICATIONS:  Prior to Admission medications   Medication Sig Start Date End Date Taking? Authorizing Provider  albuterol (PROVENTIL HFA;VENTOLIN HFA) 108 (90 BASE) MCG/ACT inhaler Inhale 1 puff into the lungs every 6 (six) hours as needed for wheezing or shortness of breath.    Historical Provider, MD  AMLODIPINE BESYLATE PO Take 1 tablet by mouth  daily.    Historical Provider, MD  BENAZEPRIL HCL PO Take 1 tablet by mouth daily.    Historical Provider, MD  carvedilol (COREG) 12.5 MG tablet Take 1 tablet (12.5 mg total) by mouth 2 (two) times daily with a meal. 09/03/14   Jaclyn Shaggy, MD  Fluticasone-Salmeterol (ADVAIR DISKUS) 250-50 MCG/DOSE AEPB Inhale 1 puff into the lungs 2 (two) times daily. 09/03/14   Jaclyn Shaggy, MD  furosemide (LASIX) 40 MG tablet Take 1 tablet (40 mg total) by mouth daily. 09/11/14   Jaclyn Shaggy, MD  lisinopril (PRINIVIL,ZESTRIL) 20 MG tablet Take 1 tablet (20 mg total) by mouth daily. 08/20/14   Zadie Rhine, MD  predniSONE (DELTASONE) 50 MG tablet One tablet PO daily for 4 days 08/20/14   Zadie Rhine, MD    ALLERGIES:  No Known Allergies  SOCIAL HISTORY:  Social History  Substance Use Topics  . Smoking status: Current Every Day Smoker -- 0.50 packs/day  . Smokeless tobacco: Not on file  . Alcohol Use: Yes     Comment: occasionally    FAMILY HISTORY: Family History  Problem Relation Age of Onset  . Diabetes Mother   . Hypertension Mother   . Cancer Father     EXAM: BP 213/127 mmHg  Pulse 99  Temp(Src) 97.9 F (36.6 C) (Oral)  Resp 20  Ht  (1.626 m)  Wt 183 lb (83.008 kg)  BMI 31.40 kg/m2  SpO2 98%  LMP 09/10/2014 (Approximate) CONSTITUTIONAL: Alert and oriented and responds appropriately to questions. Appears uncomfortable but is afebrile and nontoxic HEAD: Normocephalic EYES:  Conjunctivae clear, PERRL, has photophobia ENT: normal nose; no rhinorrhea; moist mucous membranes; pharynx without lesions noted NECK: Supple, no meningismus, no LAD  CARD: RRR; S1 and S2 appreciated; no murmurs, no clicks, no rubs, no gallops RESP: Normal chest excursion without splinting or tachypnea; breath sounds clear and equal bilaterally; no wheezes, no rhonchi, no rales, no hypoxia or respiratory distress, speaking full sentences ABD/GI: Normal bowel sounds; non-distended; soft, non-tender, no  rebound, no guarding, no peritoneal signs BACK:  The back appears normal and is non-tender to palpation, there is no CVA tenderness EXT: Normal ROM in all joints; non-tender to palpation; no edema; normal capillary refill; no cyanosis, no calf tenderness or swelling    SKIN: Normal color for age and race; warm NEURO: Moves all extremities equally, sensation to light touch intact diffusely, cranial nerves II through XII intact, strength 5/5 in all 4 extremity, normal gait PSYCH: The patient's mood and manner are appropriate. Grooming and personal hygiene are appropriate.  MEDICAL DECISION MAKING: Patient here with complaints of an asthma exacerbation. Her lungs are now clear to auscultation with good aeration and she is not hypoxic. We'll obtain chest x-ray and give steroids. This time I do not feel she needs a breathing treatment.  Also patient is hypertensive. She is on multiple blood pressure medication which she reports she has been compliant with. Blood pressure may be related to patient's pain. We'll treat her pain first and follow-up on her blood pressure. Will obtain screening labs and EKG.  Patient reports she is also having a headache that she describes as similar to her prior migraines. No neurologic deficits. Given she has hypertensive however does appear uncomfortable will obtain a CT of her head. Symptoms started at 12 AM.  ED PROGRESS: Patient's labs unremarkable other than chronic kidney disease which is stable. Chest x-ray is clear. CT of her head shows no bleed or other acute abnormalities.  Headache started less than 6 hours ago therefore subarachnoid hemorrhage has been ruled out. Patient given Reglan, Benadryl, IV fluids and reports headache is improving but has vomited. We'll give Toradol, Zofran and reassess.   Pt reports her HA has completely resolved as has her SOB.  Pt able to ambulate without hypoxia (had one brief episode of sats at 87% but quickly back into 90s).  Several  sats in upper 80s while asleep that quickly improve with change of position and waking up.  No CP or SOB with ambulation.  Will dc with albuterol inhaler and steroids.  She has nebulizer at home.  Advised her to quit smoking.  Lungs still cleat to auscultation with good aeration.  She denies history of PE, DVT, exogenous estrogen use, recent fracture, surgery, trauma, hospitalization. No lower extremity swelling or pain. Dc'ed with outpt neuro follow up for migraines and Rx for phenergan and fioricet.  HTN also resolved completely once pain resolved. No BP meds given in ED.  Has BP meds at home that she will take this AM.   EKG Interpretation  Date/Time:  Tuesday October 16 2014 05:21:23 EDT Ventricular Rate:  85 PR Interval:  140 QRS Duration: 91 QT Interval:  492 QTC Calculation: 585 R Axis:   66 Text Interpretation:  Sinus rhythm Right atrial enlargement Consider left ventricular hypertrophy Prolonged QT interval No old tracing to compare Confirmed by Burley Kopka,  DO, Sigifredo Pignato [phone]) on 10/16/2014 5:59:32 AM        Layla Maw Renso Swett, DO 10/17/14 [id number]

## 2014-10-16 NOTE — ED Notes (Signed)
Pt resting for few more minutes; drowsy from meds.

## 2014-10-16 NOTE — ED Notes (Signed)
Patient transported to CT 

## 2014-10-16 NOTE — ED Notes (Signed)
Pt reports intermittent wheezing x1-2 weeks, used MDI and nebulizer at home w/o relief - pt also c/o headache that began approx midnight, pt reports difficulty sleeping.

## 2014-10-16 NOTE — Discharge Instructions (Signed)
Asthma, Adult Asthma is a recurring condition in which the airways tighten and narrow. Asthma can make it difficult to breathe. It can cause coughing, wheezing, and shortness of breath. Asthma episodes, also called asthma attacks, range from minor to life-threatening. Asthma cannot be cured, but medicines and lifestyle changes can help control it. CAUSES Asthma is believed to be caused by inherited (genetic) and environmental factors, but its exact cause is unknown. Asthma may be triggered by allergens, lung infections, or irritants in the air. Asthma triggers are different for each person. Common triggers include:   Animal dander.  Dust mites.  Cockroaches.  Pollen from trees or grass.  Mold.  Smoke.  Air pollutants such as dust, household cleaners, hair sprays, aerosol sprays, paint fumes, strong chemicals, or strong odors.  Cold air, weather changes, and winds (which increase molds and pollens in the air).  Strong emotional expressions such as crying or laughing hard.  Stress.  Certain medicines (such as aspirin) or types of drugs (such as beta-blockers).  Sulfites in foods and drinks. Foods and drinks that may contain sulfites include dried fruit, potato chips, and sparkling grape juice.  Infections or inflammatory conditions such as the flu, a cold, or an inflammation of the nasal membranes (rhinitis).  Gastroesophageal reflux disease (GERD).  Exercise or strenuous activity. SYMPTOMS Symptoms may occur immediately after asthma is triggered or many hours later. Symptoms include:  Wheezing.  Excessive nighttime or early morning coughing.  Frequent or severe coughing with a common cold.  Chest tightness.  Shortness of breath. DIAGNOSIS  The diagnosis of asthma is made by a review of your medical history and a physical exam. Tests may also be performed. These may include:  Lung function studies. These tests show how much air you breathe in and out.  Allergy  tests.  Imaging tests such as X-rays. TREATMENT  Asthma cannot be cured, but it can usually be controlled. Treatment involves identifying and avoiding your asthma triggers. It also involves medicines. There are 2 classes of medicine used for asthma treatment:   Controller medicines. These prevent asthma symptoms from occurring. They are usually taken every day.  Reliever or rescue medicines. These quickly relieve asthma symptoms. They are used as needed and provide short-term relief. Your health care provider will help you create an asthma action plan. An asthma action plan is a written plan for managing and treating your asthma attacks. It includes a list of your asthma triggers and how they may be avoided. It also includes information on when medicines should be taken and when their dosage should be changed. An action plan may also involve the use of a device called a peak flow meter. A peak flow meter measures how well the lungs are working. It helps you monitor your condition. HOME CARE INSTRUCTIONS   Take medicines only as directed by your health care provider. Speak with your health care provider if you have questions about how or when to take the medicines.  Use a peak flow meter as directed by your health care provider. Record and keep track of readings.  Understand and use the action plan to help minimize or stop an asthma attack without needing to seek medical care.  Control your home environment in the following ways to help prevent asthma attacks:  Do not smoke. Avoid being exposed to secondhand smoke.  Change your heating and air conditioning filter regularly.  Limit your use of fireplaces and wood stoves.  Get rid of pests (such as roaches  and mice) and their droppings.  Throw away plants if you see mold on them.  Clean your floors and dust regularly. Use unscented cleaning products.  Try to have someone else vacuum for you regularly. Stay out of rooms while they are  being vacuumed and for a short while afterward. If you vacuum, use a dust mask from a hardware store, a double-layered or microfilter vacuum cleaner bag, or a vacuum cleaner with a HEPA filter.  Replace carpet with wood, tile, or vinyl flooring. Carpet can trap dander and dust.  Use allergy-proof pillows, mattress covers, and box spring covers.  Wash bed sheets and blankets every week in hot water and dry them in a dryer.  Use blankets that are made of polyester or cotton.  Clean bathrooms and kitchens with bleach. If possible, have someone repaint the walls in these rooms with mold-resistant paint. Keep out of the rooms that are being cleaned and painted.  Wash hands frequently. SEEK MEDICAL CARE IF:   You have wheezing, shortness of breath, or a cough even if taking medicine to prevent attacks.  The colored mucus you cough up (sputum) is thicker than usual.  Your sputum changes from clear or white to yellow, green, gray, or bloody.  You have any problems that may be related to the medicines you are taking (such as a rash, itching, swelling, or trouble breathing).  You are using a reliever medicine more than 2-3 times per week.  Your peak flow is still at 50-79% of your personal best after following your action plan for 1 hour.  You have a fever. SEEK IMMEDIATE MEDICAL CARE IF:   You seem to be getting worse and are unresponsive to treatment during an asthma attack.  You are short of breath even at rest.  You get short of breath when doing very little physical activity.  You have difficulty eating, drinking, or talking due to asthma symptoms.  You develop chest pain.  You develop a fast heartbeat.  You have a bluish color to your lips or fingernails.  You are light-headed, dizzy, or faint.  Your peak flow is less than 50% of your personal best.   This information is not intended to replace advice given to you by your health care provider. Make sure you discuss any  questions you have with your health care provider.   Document Released: 12/22/2004 Document Revised: 09/12/2014 Document Reviewed: 07/21/2012 Elsevier Interactive Patient Education 2016 [employer].  Migraine Headache A migraine headache is an intense, throbbing pain on one or both sides of your head. A migraine can last for 30 minutes to several hours. CAUSES  The exact cause of a migraine headache is not always known. However, a migraine may be caused when nerves in the brain become irritated and release chemicals that cause inflammation. This causes pain. Certain things may also trigger migraines, such as:  Alcohol.  Smoking.  Stress.  Menstruation.  Aged cheeses.  Foods or drinks that contain nitrates, glutamate, aspartame, or tyramine.  Lack of sleep.  Chocolate.  Caffeine.  Hunger.  Physical exertion.  Fatigue.  Medicines used to treat chest pain (nitroglycerine), birth control pills, estrogen, and some blood pressure medicines. SIGNS AND SYMPTOMS  Pain on one or both sides of your head.  Pulsating or throbbing pain.  Severe pain that prevents daily activities.  Pain that is aggravated by any physical activity.  Nausea, vomiting, or both.  Dizziness.  Pain with exposure to bright lights, loud noises, or activity.  General sensitivity to bright lights, loud noises, or smells. Before you get a migraine, you may get warning signs that a migraine is coming (aura). An aura may include:  Seeing flashing lights.  Seeing bright spots, halos, or zigzag lines.  Having tunnel vision or blurred vision.  Having feelings of numbness or tingling.  Having trouble talking.  Having muscle weakness. DIAGNOSIS  A migraine headache is often diagnosed based on:  Symptoms.  Physical exam.  A CT scan or MRI of your head. These imaging tests cannot diagnose migraines, but they can help rule out other causes of headaches. TREATMENT Medicines may be given for  pain and nausea. Medicines can also be given to help prevent recurrent migraines.  HOME CARE INSTRUCTIONS  Only take over-the-counter or prescription medicines for pain or discomfort as directed by your health care provider. The use of long-term narcotics is not recommended.  Lie down in a dark, quiet room when you have a migraine.  Keep a journal to find out what may trigger your migraine headaches. For example, write down:  What you eat and drink.  How much sleep you get.  Any change to your diet or medicines.  Limit alcohol consumption.  Quit smoking if you smoke.  Get 7-9 hours of sleep, or as recommended by your health care provider.  Limit stress.  Keep lights dim if bright lights bother you and make your migraines worse. SEEK IMMEDIATE MEDICAL CARE IF:   Your migraine becomes severe.  You have a fever.  You have a stiff neck.  You have vision loss.  You have muscular weakness or loss of muscle control.  You start losing your balance or have trouble walking.  You feel faint or pass out.  You have severe symptoms that are different from your first symptoms. MAKE SURE YOU:   Understand these instructions.  Will watch your condition.  Will get help right away if you are not doing well or get worse.   This information is not intended to replace advice given to you by your health care provider. Make sure you discuss any questions you have with your health care provider.     Emergency Department Resource Guide 1) Find a Doctor and Pay Out of Pocket Although you won't have to find out who is covered by your insurance plan, it is a good idea to ask around and get recommendations. You will then need to call the office and see if the doctor you have chosen will accept you as a new patient and what types of options they offer for patients who are self-pay. Some doctors offer discounts or will set up payment plans for their patients who do not have insurance, but you  will need to ask so you aren't surprised when you get to your appointment.  2) Contact Your Local Health Department Not all health departments have doctors that can see patients for sick visits, but many do, so it is worth a call to see if yours does. If you don't know where your local health department is, you can check in your phone book. The CDC also has a tool to help you locate your state's health department, and many state websites also have listings of all of their local health departments.  3) Find a Walk-in Clinic If your illness is not likely to be very severe or complicated, you may want to try a walk in clinic. These are popping up all over the country in pharmacies, drugstores, and shopping  centers. They're usually staffed by nurse practitioners or physician assistants that have been trained to treat common illnesses and complaints. They're usually fairly quick and inexpensive. However, if you have serious medical issues or chronic medical problems, these are probably not your best option.  No Primary Care Doctor: - Call Health Connect at  [phone] - they can help you locate a primary care doctor that  accepts your insurance, provides certain services, etc. - Physician Referral Service- [phone]  Chronic Pain Problems: Organization         Address  Phone   Notes  [facility]  [phone] Patients need to be referred by their primary care doctor.   Medication Assistance: Organization         Address  Phone   Notes  [facility] Medication [facility] [address] [place] [place], [place] [address] [phone] --Must be a resident of [facility] -- Must have NO insurance coverage whatsoever (no Medicaid/ Medicare, etc.) -- The pt. MUST have a primary care doctor that directs their care regularly and follows them in the community   [facility]  [phone]   [employer]  [phone]    Agencies that provide inexpensive medical  care: Organization         Address  Phone   Notes  [facility]  [phone]   [facility]    [phone]   [facility] [address] [place], [place] [address] [phone]   [facility] [address] [place] [place] [phone]   Planned Parenthood    [phone]   [facility]    [phone]   [facility]  [address], [place] Phone:  [phone], Fax:  [phone] Hours of Operation:  9 am - 6 pm, M-F.  Also accepts Medicaid/Medicare and self-pay.  [facility]  [address], [place] Phone: [phone], Fax: [phone]. Hours of Operation:  8:30 am - 5:30 pm, M-F.  Also accepts Medicaid and self-pay.  [facility] [place] [address], [place] Phone: [phone]   [facility] [address] [place]asha [place], [place] [phone], Ext. [phone] Mondays & Thursdays: 7-9 AM.  First 15 patients are seen on a first come, first serve basis.    Medicaid-accepting [facility] Providers:  Organization         Address  Phone   Notes  [facility] [address],  [phone] Also accepts self-pay patients.  [facility] [address] Laurell Josephs [place], [place]  [phone]   [facility] [address], [place] [phone]   [facility] [address], [place] [phone]   Renaye Rakers [address], [place]   [phone] Only accepts [place] Access [place] patients after they have their name applied to their card.   Self-Pay (no insurance) in [facility]:  Organization         Address  Phone   Notes  Sickle Cell Patients, [facility] [address] [place], [place] [phone]   [facility] [address] [place],  [place] [phone]   [facility] [place]  [address],  [phone]  [facility]/Dr. Osei-Bonsu  [address], [place] or [address], [place] [phone] Phone number for both [place] and [place] locations is the same.  [facility] [address], [place] [phone]   [facility] [address] [place] or [address] [phone]   [facility] [address], [place] [phone], phone; [phone], fax Sees patients 1st and 3rd Saturday of every month.  Must not qualify for public or private insurance (i.e. Medicaid, Medicare, [place] Health Choice, Veterans' Benefits)  Household income should be no more than 200% of the poverty level The clinic cannot treat you if you are pregnant or think you are pregnant  Sexually transmitted diseases are not treated at the clinic.    Dental Care: Organization         Address  Phone  Notes  [facility] of [facility] [address] [place], [place] [phone] Accepts children up to age 41 who are enrolled in [place] or [place] Health Choice; pregnant women with a Medicaid card; and children who have applied for Medicaid or [place] Health Choice, but were declined, whose parents can pay a reduced fee at time of service.  [facility] Department of [facility]  [address], [place] [phone] Accepts children up to age 70 who are enrolled in [place] or [place] Health Choice; pregnant women with a Medicaid card; and children who have applied for Medicaid or [place] Health Choice, but were declined, whose parents can pay a reduced fee at time of service.  [facility]  [address] [place], [place] [phone] Patients are seen by appointment only. Walk-ins are not accepted. [facility] will see patients 22 years of age and older. Monday - Tuesday (8am-5pm) Most Wednesdays (8:30-5pm) $30 per visit, cash only  [facility] Adult Dental Access PROGRAM  [address], [facility] [phone] Patients are seen by appointment only. Walk-ins are not accepted. [facility] will see patients 80 years of age and older. One Wednesday Evening (Monthly: Volunteer Based).  $30 per visit, cash only  [employer]  [phone] for adults; Children under age 4, call Graduate Pediatric Dentistry at [phone]. Children aged 73-14, please call [phone] to request a pediatric application.  Dental services are provided in all areas of dental care including fillings, crowns and bridges, complete and partial dentures, implants, gum treatment, root canals, and extractions. Preventive care is also provided. Treatment is provided to both adults and children. Patients are selected via a lottery and there is often a waiting list.   [facility] [address] [place]  [phone] [email or web address]   [facility] [address] [place], [place] [phone], Ext. [phone] Second and Fourth Thursday of each month, opens at 6:30 AM; Clinic ends at 9 AM.  Patients are seen on a first-come first-served basis, and a limited number are seen during each clinic.   [facility]  [address] Ether [place], [place] [phone]   Eligibility Requirements You must have lived in [place], [place], or [place] counties for at least the last three months.   You cannot be eligible for state or federal sponsored [place], including [employer], [place], or [employer].   You generally cannot be eligible for healthcare insurance through  your employer.    How to apply: Eligibility screenings are held every Tuesday and Wednesday afternoon from 1:00 pm until 4:00 pm. You do not need an appointment for the interview!   [facility] [address] [place], [place] [phone]   [facility] Health Department  [phone]   [facility] Health Department  [phone]   [facility] Department  [phone]    Behavioral Health Resources in the Community: Intensive Outpatient Programs Organization         Address  Phone  Notes  [facility] Services [address], [place], [place] [phone]   [facility] Outpatient [address] [place], [place] [phone]   [facility] [address] [place], [place]  [phone]   [facility] Mental Health [address]  [place], [place] [phone] or [phone]   Substance Abuse Resources Organization         Address  Phone  Notes  [facility] Drug Services  [phone]   [facility]  5612214212   [facility]  [phone]   Floydene Flock  [phone]   Residential & Outpatient Substance Abuse Program  [phone]   Psychological Services Organization         Address  Phone  Notes  [facility] Health  [phone]   [facility]  [phone]   [facility] Mental Health [address], [place] [phone] or [phone]    Mobile Crisis Teams Organization         Address  Phone  Notes  [facility], Mobile Crisis Care Unit  [phone]   [facility]  [address] [place], [place] [phone]   Doristine Locks [address] [place] [phone]    Self-Help/Support Groups Organization         Address  Phone             Notes  [facility] [place] - variety of support groups  336- I7437963 Call for more information  Narcotics Anonymous (NA), [facility] [address], [employer] [place]  2 meetings at this location   Statistician         Address  Phone  Notes  [facility] [address],    [place]  [phone]   [facility]  [address], [place] [address], [place], [place] [phone]   [facility] [address] [place], [place] [phone] Admissions: 8am-3pm M-F  [facility] [address]    [place], [place] [phone]   [facility] [address] [place], [place], [place] [phone]   [facility] [address],  [place], [place] [phone]   [facility] - Intensive Outpatient [address] Laurell Josephs [address], [place], [place] [phone]   [facility] ([facility] [address] [place]  [place], [place] [phone] or [phone]   [facility] ([facility]) [address] [place], [place] [phone] Accepts Medicaid  [facility] [address],  [place] [phone] Substance Abuse/Addiction Treatment   [facility] Organization         Address  Phone  Notes  [facility]  [phone]   Angie Fava, PhD [address] [place] [address] [place], [place]   [phone] or [phone]   [facility] Behavioral   [address] [place], [place] [phone]  [facility] [address] [place], [place] [phone] Insurance/Medicaid/sponsorship through [employer] and Families [address] [place], [place] [phone] [place], [place] [phone]    Dr. Adele Schilder  [phone]   [facility] Dept. 1) [address], [place] 2) [place] 3)  [place] [address], [place] [phone]  [phone]   [place] [phone] or [phone] (After Hours)

## 2014-10-16 NOTE — ED Notes (Signed)
PT ambulated with baseline gait; VSS; A&Ox3; no signs of distress; respirations even and unlabored; skin warm and dry; no questions upon discharge.  

## 2014-10-17 ENCOUNTER — Encounter: Payer: Self-pay | Admitting: Family Medicine

## 2014-10-17 ENCOUNTER — Ambulatory Visit: Payer: PRIVATE HEALTH INSURANCE | Attending: Family Medicine | Admitting: Family Medicine

## 2014-10-17 VITALS — BP 176/124 | HR 83 | Temp 98.3°F | Resp 18 | Ht 64.0 in | Wt 193.0 lb

## 2014-10-17 DIAGNOSIS — N183 Chronic kidney disease, stage 3 unspecified: Secondary | ICD-10-CM

## 2014-10-17 DIAGNOSIS — J4540 Moderate persistent asthma, uncomplicated: Secondary | ICD-10-CM | POA: Diagnosis not present

## 2014-10-17 DIAGNOSIS — I5042 Chronic combined systolic (congestive) and diastolic (congestive) heart failure: Secondary | ICD-10-CM | POA: Diagnosis not present

## 2014-10-17 DIAGNOSIS — G43009 Migraine without aura, not intractable, without status migrainosus: Secondary | ICD-10-CM | POA: Diagnosis not present

## 2014-10-17 DIAGNOSIS — I10 Essential (primary) hypertension: Secondary | ICD-10-CM

## 2014-10-17 DIAGNOSIS — G43909 Migraine, unspecified, not intractable, without status migrainosus: Secondary | ICD-10-CM | POA: Insufficient documentation

## 2014-10-17 DIAGNOSIS — I130 Hypertensive heart and chronic kidney disease with heart failure and stage 1 through stage 4 chronic kidney disease, or unspecified chronic kidney disease: Secondary | ICD-10-CM | POA: Insufficient documentation

## 2014-10-17 DIAGNOSIS — N189 Chronic kidney disease, unspecified: Secondary | ICD-10-CM | POA: Insufficient documentation

## 2014-10-17 MED ORDER — CARVEDILOL 25 MG PO TABS: 25.0000 mg | ORAL_TABLET | Freq: Two times a day (BID) | ORAL | Status: DC

## 2014-10-17 MED ORDER — BECLOMETHASONE DIPROPIONATE 80 MCG/ACT IN AERS: 1.0000 | INHALATION_SPRAY | Freq: Two times a day (BID) | RESPIRATORY_TRACT | Status: DC

## 2014-10-17 MED ORDER — TOPIRAMATE 50 MG PO TABS: 50.0000 mg | ORAL_TABLET | Freq: Two times a day (BID) | ORAL | Status: DC

## 2014-10-17 MED ORDER — POTASSIUM CHLORIDE ER 10 MEQ PO TBCR: 10.0000 meq | EXTENDED_RELEASE_TABLET | Freq: Every day | ORAL | Status: DC

## 2014-10-17 MED ORDER — FUROSEMIDE 40 MG PO TABS: 40.0000 mg | ORAL_TABLET | Freq: Two times a day (BID) | ORAL | Status: DC

## 2014-10-17 NOTE — Progress Notes (Signed)
CC:  HPI: April [name] is a 39 y.o. female with a history of hypertension, CHF, asthma, migraine, CK D who relocated from [place] a few months ago and was seen at her last office visit to establish care. She had a significantly elevated blood pressure with systolics in the 190s and so Coreg was added to regimen with a slight improvement in blood pressure but it is still elevated.  She had complained of shortness of breath and QVAR was upgraded to Advair but she reports that she has had no improvement in symptoms and also admits that she did better while on Qvar. Of note her BNP came back elevated at 3301 and so I had commenced on Lasix. She still complains of dyspnea on mild exertion especially when climbing stairs, orthopnea, paroxysmal nocturnal dyspnea but has no pedal edema or chest pains.  She continues to have headaches for which I placed on Fioricet at her last office visit with no much improvement in symptoms She had an ED visit yesterday when she had presented with shortness of breath and headache, chest x-ray and CT head revealed unremarkable and she was placed on Reglan, Benadryl and IV fluids as well as Toradol resulting improvement in her headaches and she was subsequently discharged.     No Known Allergies Past Medical History  Diagnosis Date  . Hypertension   . Renal disorder   . Arthritis   . Asthma    Current Outpatient Prescriptions on File Prior to Visit  Medication Sig Dispense Refill  . albuterol (PROVENTIL HFA;VENTOLIN HFA) 108 (90 BASE) MCG/ACT inhaler Inhale 1 puff into the lungs every 6 (six) hours as needed for wheezing or shortness of breath.    Marland Kitchen albuterol (PROVENTIL HFA;VENTOLIN HFA) 108 (90 BASE) MCG/ACT inhaler Inhale 2 puffs into the lungs every 4 (four) hours as needed for wheezing or shortness of breath. 1 Inhaler 0  . butalbital-acetaminophen-caffeine (FIORICET) 50-325-40 MG tablet Take 1-2 tablets by mouth every 6 (six) hours as needed for  headache. 20 tablet 0  . carvedilol (COREG) 12.5 MG tablet Take 1 tablet (12.5 mg total) by mouth 2 (two) times daily with a meal. 60 tablet 2  . Fluticasone-Salmeterol (ADVAIR DISKUS) 250-50 MCG/DOSE AEPB Inhale 1 puff into the lungs 2 (two) times daily. 1 each 3  . furosemide (LASIX) 40 MG tablet Take 1 tablet (40 mg total) by mouth daily. 30 tablet 3  . lisinopril (PRINIVIL,ZESTRIL) 20 MG tablet Take 1 tablet (20 mg total) by mouth daily. 30 tablet 0  . predniSONE (DELTASONE) 20 MG tablet Take 3 tablets (60 mg total) by mouth daily. 15 tablet 0  . promethazine (PHENERGAN) 25 MG tablet Take 1 tablet (25 mg total) by mouth every 6 (six) hours as needed for nausea or vomiting. 15 tablet 0   No current facility-administered medications on file prior to visit.   Family History  Problem Relation Age of Onset  . Diabetes Mother   . Hypertension Mother   . Cancer Father    Social History   Social History  . Marital Status: Married    Spouse Name: N/A  . Number of Children: N/A  . Years of Education: N/A   Occupational History  . Not on file.   Social History Main Topics  . Smoking status: Current Every Day Smoker -- 0.50 packs/day  . Smokeless tobacco: Not on file  . Alcohol Use: Yes     Comment: occasionally  . Drug Use: Yes  . Sexual Activity: Yes  Other Topics Concern  . Not on file   Social History Narrative    Review of Systems: Constitutional: Negative for fever, chills, diaphoresis, activity change, appetite change and fatigue. HENT: Negative for ear pain, nosebleeds, congestion, facial swelling, rhinorrhea, neck pain, neck stiffness and ear discharge.  Eyes: Negative for pain, discharge, redness, itching and visual disturbance. Respiratory: positive for cough, shortness of breath, wheezing .  Cardiovascular: Negative for chest pain, palpitations and leg swelling. Gastrointestinal: Negative for abdominal distention. Genitourinary: Negative for dysuria, urgency,  frequency, hematuria, flank pain, decreased urine volume, difficulty urinating and dyspareunia.  Musculoskeletal: Negative for back pain, joint swelling, arthralgias and gait problem. Neurological: Negative for dizziness, tremors, seizures, syncope, facial asymmetry, speech difficulty, weakness, light-headedness, numbness and positive for headaches.  Hematological: Negative for adenopathy. Does not bruise/bleed easily. Psychiatric/Behavioral: Negative for hallucinations, behavioral problems, confusion, dysphoric mood, decreased concentration and agitation.    Objective: Filed Vitals:   10/17/14 1142 10/17/14 1153  BP: 170/126 176/124  Pulse: 83   Temp: 98.3 F (36.8 C)   TempSrc: Oral   Resp: 178   Height:  (1.626 m)   Weight: 193 lb (87.544 kg)   SpO2: 98%       Physical Exam: Constitutional: Patient appears well-developed and well-nourished. No distress. HENT: Normocephalic, atraumatic, External right and left ear normal. Oropharynx is clear and moist.  Eyes: Conjunctivae and EOM are normal. PERRLA, no scleral icterus. Neck: Normal ROM. Neck supple. No JVD. No tracheal deviation. No thyromegaly. CVS: RRR, S1/S2 +, no murmurs, no gallops, no carotid bruit.  Pulmonary: Effort and breath sounds normal, no stridor, rhonchi, wheezes, rales.  Abdominal: Soft. BS +,  no distension, tenderness, rebound or guarding.  Musculoskeletal: Normal range of motion. No edema and no tenderness.  Lymphadenopathy: No lymphadenopathy noted, cervical, inguinal or axillary Neuro: Alert. Normal reflexes, muscle tone coordination. No cranial nerve deficit. Skin: Skin is warm and dry. No rash noted. Not diaphoretic. No erythema. No pallor. Psychiatric: Normal mood and affect. Behavior, judgment, thought content normal.  Lab Results  Component Value Date   WBC 6.8 10/16/2014   HGB 12.7 10/16/2014   HCT 37.9 10/16/2014   MCV 89.0 10/16/2014   PLT 247 10/16/2014   Lab Results  Component Value  Date   CREATININE 1.89* 10/16/2014   BUN 13 10/16/2014   NA 134* 10/16/2014   K 3.3* 10/16/2014   CL 91* 10/16/2014   CO2 27 10/16/2014   BNP No results found for: BNP  ProBNP    Component Value Date/Time   PROBNP 3301.00* 09/03/2014 1622        Assessment and plan:  39 year old female with history of hypertension, asthma, CKD, CHF who recently relocated to [facility] and is here to establish care.  Uncontrolled hypertension: Increased dose of Coreg Low-sodium, DASH diet as well as last modifications.  Asthma: Uncontrolled. There could be an overlap in the dyspnea from asthma and CHF Switched back from Advair to Qvar hospitalization request as she states she did better on Qvar  CHF: Increase Lasix to 40 mg twice daily due to persisting dyspnea BMET at next visit Due to CK D we will need to be cautious with titrating her Lasix. 2d echo to be ordered at next visit Heart healthy, low-sodium diet; restrict fluids to less than 2 L a day.  Will need moderate intensity statin at next visit Will need to obtain previous medical records from [place]  Hypokalemia: Likely secondary to Lasix. Placed on potassium pills.  CKD:  Avoid nephrotoxic agents. Referred to nephrology  Migraine: Placed on Topamax for prophylaxis meanwhile continue Fioricet for breakthrough.      Jaclyn ShaggyEnobong, Amao, MD. [facility] [phone] 10/17/2014, 11:52 AM

## 2014-10-17 NOTE — Patient Instructions (Signed)

## 2014-10-17 NOTE — Progress Notes (Signed)
Pt's here for 2 wk f/up for HTN, Asthma, SOB, and Migraines. Pt reports taking meds today.   Pt states her medication rx'd for the SOB is not helping.  Pt's here to est. care with a PCP.

## 2014-10-18 ENCOUNTER — Telehealth: Payer: Self-pay

## 2014-10-18 ENCOUNTER — Other Ambulatory Visit (HOSPITAL_COMMUNITY): Payer: PRIVATE HEALTH INSURANCE

## 2014-10-18 NOTE — Telephone Encounter (Signed)
Please give Pt the following appt information:  Pt's appt is set for 10/18 @ 1p for her 2d echo, but arrive at 12:45p. Appt is at [facility]. [facility] and stop by at the Admission desk.

## 2014-10-18 NOTE — Telephone Encounter (Signed)
Nurse called patient, reached voicemail. Message states "Sorry, that mailbox is full".

## 2014-10-18 NOTE — Telephone Encounter (Signed)
CMA called Pt, pt didn't answer, so I left a message asking Pt's to return my call here at the clinic ASAP.

## 2014-10-22 NOTE — Telephone Encounter (Signed)
-----   Message from Jaclyn ShaggyEnobong Amao, MD sent at 10/17/2014  4:46 PM EDT ----- Regarding: potassium tabs Please inform her that i sent a rx for potassium pills to the pharmacy due to hypokalemia noted on labs drawn during ED visit.  Thanks.

## 2014-10-22 NOTE — Telephone Encounter (Signed)
CMA called pt concerning her potassium pills ready at the pharmacy on file, but VM said, "Mailbox is full."

## 2014-10-23 ENCOUNTER — Ambulatory Visit (HOSPITAL_COMMUNITY): Admission: RE | Admit: 2014-10-23 | Payer: PRIVATE HEALTH INSURANCE | Source: Ambulatory Visit

## 2014-10-31 ENCOUNTER — Telehealth: Payer: Self-pay

## 2014-10-31 NOTE — Telephone Encounter (Signed)
Patient picked up potassium prescription at pharmacy.

## 2014-12-25 ENCOUNTER — Ambulatory Visit: Payer: PRIVATE HEALTH INSURANCE | Attending: Family Medicine | Admitting: Family Medicine

## 2014-12-25 ENCOUNTER — Encounter: Payer: Self-pay | Admitting: Family Medicine

## 2014-12-25 VITALS — BP 200/145 | HR 82 | Temp 98.2°F | Resp 13 | Ht 64.0 in | Wt 191.2 lb

## 2014-12-25 DIAGNOSIS — I129 Hypertensive chronic kidney disease with stage 1 through stage 4 chronic kidney disease, or unspecified chronic kidney disease: Secondary | ICD-10-CM | POA: Insufficient documentation

## 2014-12-25 DIAGNOSIS — F17210 Nicotine dependence, cigarettes, uncomplicated: Secondary | ICD-10-CM | POA: Diagnosis not present

## 2014-12-25 DIAGNOSIS — I10 Essential (primary) hypertension: Secondary | ICD-10-CM | POA: Diagnosis present

## 2014-12-25 DIAGNOSIS — N183 Chronic kidney disease, stage 3 unspecified: Secondary | ICD-10-CM

## 2014-12-25 DIAGNOSIS — J45909 Unspecified asthma, uncomplicated: Secondary | ICD-10-CM | POA: Diagnosis not present

## 2014-12-25 DIAGNOSIS — Z79899 Other long term (current) drug therapy: Secondary | ICD-10-CM | POA: Diagnosis not present

## 2014-12-25 DIAGNOSIS — I5042 Chronic combined systolic (congestive) and diastolic (congestive) heart failure: Secondary | ICD-10-CM | POA: Insufficient documentation

## 2014-12-25 DIAGNOSIS — G43009 Migraine without aura, not intractable, without status migrainosus: Secondary | ICD-10-CM | POA: Insufficient documentation

## 2014-12-25 DIAGNOSIS — J4540 Moderate persistent asthma, uncomplicated: Secondary | ICD-10-CM

## 2014-12-25 LAB — BASIC METABOLIC PANEL
BUN: 28 mg/dL — ABNORMAL HIGH (ref 7–25)
CO2: 20 mmol/L (ref 20–31)
Calcium: 9.1 mg/dL (ref 8.6–10.2)
Chloride: 106 mmol/L (ref 98–110)
Creat: 2.11 mg/dL — ABNORMAL HIGH (ref 0.50–1.10)
GLUCOSE: 90 mg/dL (ref 65–99)
POTASSIUM: 4.5 mmol/L (ref 3.5–5.3)
SODIUM: 139 mmol/L (ref 135–146)

## 2014-12-25 MED ORDER — FUROSEMIDE 40 MG PO TABS: 40.0000 mg | ORAL_TABLET | Freq: Two times a day (BID) | ORAL | Status: DC

## 2014-12-25 MED ORDER — HYDRALAZINE HCL 50 MG PO TABS: 50.0000 mg | ORAL_TABLET | Freq: Two times a day (BID) | ORAL | Status: DC

## 2014-12-25 MED ORDER — CARVEDILOL 25 MG PO TABS: 25.0000 mg | ORAL_TABLET | Freq: Two times a day (BID) | ORAL | Status: DC

## 2014-12-25 MED ORDER — LISINOPRIL 20 MG PO TABS: 20.0000 mg | ORAL_TABLET | Freq: Every day | ORAL | Status: DC

## 2014-12-25 MED ORDER — CLONIDINE HCL 0.1 MG PO TABS
0.1000 mg | ORAL_TABLET | Freq: Once | ORAL | Status: AC
  Administered 2014-12-25: 0.1 mg via ORAL

## 2014-12-25 NOTE — Patient Instructions (Signed)
Hypertension Hypertension, commonly called high blood pressure, is when the force of blood pumping through your arteries is too strong. Your arteries are the blood vessels that carry blood from your heart throughout your body. A blood pressure reading consists of a higher number over a lower number, such as 110/72. The higher number (systolic) is the pressure inside your arteries when your heart pumps. The lower number (diastolic) is the pressure inside your arteries when your heart relaxes. Ideally you want your blood pressure below 120/80. Hypertension forces your heart to work harder to pump blood. Your arteries may become narrow or stiff. Having untreated or uncontrolled hypertension can cause heart attack, stroke, kidney disease, and other problems. RISK FACTORS Some risk factors for high blood pressure are controllable. Others are not.  Risk factors you cannot control include:   Race. You may be at higher risk if you are African American.  Age. Risk increases with age.  Gender. Men are at higher risk than women before age 45 years. After age 65, women are at higher risk than men. Risk factors you can control include:  Not getting enough exercise or physical activity.  Being overweight.  Getting too much fat, sugar, calories, or salt in your diet.  Drinking too much alcohol. SIGNS AND SYMPTOMS Hypertension does not usually cause signs or symptoms. Extremely high blood pressure (hypertensive crisis) may cause headache, anxiety, shortness of breath, and nosebleed. DIAGNOSIS To check if you have hypertension, your health care provider will measure your blood pressure while you are seated, with your arm held at the level of your heart. It should be measured at least twice using the same arm. Certain conditions can cause a difference in blood pressure between your right and left arms. A blood pressure reading that is higher than normal on one occasion does not mean that you need treatment. If  it is not clear whether you have high blood pressure, you may be asked to return on a different day to have your blood pressure checked again. Or, you may be asked to monitor your blood pressure at home for 1 or more weeks. TREATMENT Treating high blood pressure includes making lifestyle changes and possibly taking medicine. Living a healthy lifestyle can help lower high blood pressure. You may need to change some of your habits. Lifestyle changes may include:  Following the DASH diet. This diet is high in fruits, vegetables, and whole grains. It is low in salt, red meat, and added sugars.  Keep your sodium intake below 2,300 mg per day.  Getting at least 30-45 minutes of aerobic exercise at least 4 times per week.  Losing weight if necessary.  Not smoking.  Limiting alcoholic beverages.  Learning ways to reduce stress. Your health care provider may prescribe medicine if lifestyle changes are not enough to get your blood pressure under control, and if one of the following is true:  You are 18-59 years of age and your systolic blood pressure is above 140.  You are 60 years of age or older, and your systolic blood pressure is above 150.  Your diastolic blood pressure is above 90.  You have diabetes, and your systolic blood pressure is over 140 or your diastolic blood pressure is over 90.  You have kidney disease and your blood pressure is above 140/90.  You have heart disease and your blood pressure is above 140/90. Your personal target blood pressure may vary depending on your medical conditions, your age, and other factors. HOME CARE INSTRUCTIONS    Have your blood pressure rechecked as directed by your health care provider.   Take medicines only as directed by your health care provider. Follow the directions carefully. Blood pressure medicines must be taken as prescribed. The medicine does not work as well when you skip doses. Skipping doses also puts you at risk for  problems.  Do not smoke.   Monitor your blood pressure at home as directed by your health care provider. SEEK MEDICAL CARE IF:   You think you are having a reaction to medicines taken.  You have recurrent headaches or feel dizzy.  You have swelling in your ankles.  You have trouble with your vision. SEEK IMMEDIATE MEDICAL CARE IF:  You develop a severe headache or confusion.  You have unusual weakness, numbness, or feel faint.  You have severe chest or abdominal pain.  You vomit repeatedly.  You have trouble breathing. MAKE SURE YOU:   Understand these instructions.  Will watch your condition.  Will get help right away if you are not doing well or get worse.   This information is not intended to replace advice given to you by your health care provider. Make sure you discuss any questions you have with your health care provider.   Document Released: 12/22/2004 Document Revised: 05/08/2014 Document Reviewed: 10/14/2012 Elsevier Interactive Patient Education 2016 Elsevier Inc.  

## 2014-12-25 NOTE — Progress Notes (Signed)
Subjective:    Patient ID: April [name], female    DOB: [birth year], 39 y.o.   MRN: [id number]  HPI 39 year old female with a history of hypertension, CHF, asthma, migraine, CK D, noncompliance who relocated from [place] a few months ago and was seen at her last office visit to establish care. Her blood pressure has been in the accelerated range and she never followed up in 3 weeks after her last office visit as recommended; she was also referred for 2-D echo due to her worsening shortness of breath and also to nephrology where she no showed for 2-D echo.  Today her blood pressure is severely elevated and she endorses being out of lisinopril; she also complains of shortness of breath on mild exertion, reduced exercise tolerance, orthopnea but denies pedal edema. She was also referred to nephrology for chronic kidney disease and her chart reveals a letter was mailed to the patient and available options and cost given she had no medical coverage but the patient informs me today that she does have medical coverage under her husband. She also states she did not show up in the clinic for appointment because she has been having transportation issues and they have only one car and due to her shortness of breath she is unable to walk to the bus stop.  She states she is unable to work and is requesting completion of disability paperwork.  Past Medical History  Diagnosis Date  . Hypertension   . Renal disorder   . Arthritis   . Asthma     History reviewed. No pertinent past surgical history.   No Known Allergies  Social History   Social History  . Marital Status: Married    Spouse Name: N/A  . Number of Children: N/A  . Years of Education: N/A   Occupational History  . Not on file.   Social History Main Topics  . Smoking status: Current Every Day Smoker -- 0.25 packs/day  . Smokeless tobacco: Not on file  . Alcohol Use: Yes     Comment: occasionally  . Drug Use: No  .  Sexual Activity: Yes   Other Topics Concern  . Not on file   Social History Narrative    Current Outpatient Prescriptions on File Prior to Visit  Medication Sig Dispense Refill  . albuterol (PROVENTIL HFA;VENTOLIN HFA) 108 (90 BASE) MCG/ACT inhaler Inhale 1 puff into the lungs every 6 (six) hours as needed for wheezing or shortness of breath.    Marland Kitchen. albuterol (PROVENTIL HFA;VENTOLIN HFA) 108 (90 BASE) MCG/ACT inhaler Inhale 2 puffs into the lungs every 4 (four) hours as needed for wheezing or shortness of breath. 1 Inhaler 0  . beclomethasone (QVAR) 80 MCG/ACT inhaler Inhale 1 puff into the lungs 2 (two) times daily. 1 Inhaler 3  . butalbital-acetaminophen-caffeine (FIORICET) 50-325-40 MG tablet Take 1-2 tablets by mouth every 6 (six) hours as needed for headache. 20 tablet 0  . promethazine (PHENERGAN) 25 MG tablet Take 1 tablet (25 mg total) by mouth every 6 (six) hours as needed for nausea or vomiting. 15 tablet 0  . topiramate (TOPAMAX) 50 MG tablet Take 1 tablet (50 mg total) by mouth 2 (two) times daily. 60 tablet 3   No current facility-administered medications on file prior to visit.       Review of Systems Constitutional: Negative for fever, chills, diaphoresis, activity change, appetite change and fatigue. HENT: Negative for ear pain, nosebleeds, congestion, facial swelling, rhinorrhea, neck pain, neck stiffness and ear  discharge.  Eyes: Negative for pain, discharge, redness, itching and visual disturbance. Respiratory: positive for cough, shortness of breath, wheezing .  Cardiovascular: Negative for chest pain, palpitations and leg swelling. Gastrointestinal: Negative for abdominal distention. Genitourinary: Negative for dysuria, urgency, frequency, hematuria, flank pain, decreased urine volume, difficulty urinating and dyspareunia.  Musculoskeletal: Negative for back pain, joint swelling, arthralgias and gait problem. Neurological: Negative for dizziness, tremors, seizures,  syncope, facial asymmetry, speech difficulty, weakness, light-headedness, numbness and positive for headaches.  Hematological: Negative for adenopathy. Does not bruise/bleed easily. Psychiatric/Behavioral: Negative for hallucinations, behavioral problems, confusion, dysphoric mood, decreased concentration and agitation.      Objective: Filed Vitals:   12/25/14 1112 12/25/14 1115 12/25/14 1204  BP: 187/142 200/140 200/145  Pulse: 82 82   Temp: 98.2 F (36.8 C)    Resp: 13    Height:  (1.626 m)    Weight: 191 lb 3.2 oz (86.728 kg)    SpO2: 98%        Physical Exam  Constitutional: Patient appears well-developed and well-nourished. No distress. HENT: Normocephalic, atraumatic, External right and left ear normal. Oropharynx is clear and moist.  Eyes: Conjunctivae and EOM are normal. PERRLA, no scleral icterus. Neck: Normal ROM. Neck supple. No JVD. No tracheal deviation. No thyromegaly. CVS: RRR, S1/S2 +, no murmurs, no gallops, no carotid bruit.  Pulmonary: Dyspneic on mild exertion ,breath sounds normal, no stridor, rhonchi, wheezes, rales.  Abdominal: Soft. BS +,  no distension, tenderness, rebound or guarding.  Musculoskeletal: Normal range of motion. No edema and no tenderness.  Lymphadenopathy: No lymphadenopathy noted, cervical, inguinal or axillary Neuro: Alert. Normal reflexes, muscle tone coordination. No cranial nerve deficit. Skin: Skin is warm and dry. No rash noted. Not diaphoretic. No erythema. No pallor. Psychiatric: Normal mood and affect. Behavior, judgment, thought content normal.       Assessment & Plan:  Accelerated hypertension: Clonidine 0.1 mg given in the clinic, blood pressure repeated after 30 minutes. Hydralazine added to regimen, will reassess blood pressure next visit Low-sodium, DASH diet as well as lifestyle modifications.  Asthma: There could be an overlap in the dyspnea from asthma and CHF Continue Qvar   CHF: NYHA 3 Accelerated  hypertension likely etiology for acute exacerbation of CHF Increase Lasix to 80 mg twice daily for 1 week then resume 40 mg twice daily due to persisting dyspnea BMET today and again at next visit Due to CK D we will need to be cautious with titrating her Lasix. 2d echo ordered again today Heart healthy, low-sodium diet; restrict fluids to less than 2 L a day.  Will need moderate intensity statin at next visit   CKD: Likely secondary to hypertensive nephropathy. Avoid nephrotoxic agents. Referred to nephrology again  Migraine: Continue with Topamax for prophylaxis meanwhile continue Fioricet for breakthrough.  Consequences of noncompliance discussed with the patient including the importance of keeping follow-up appointments. I will send a message to the referral coordinator to verify her medical coverage and if she doesn't have discussed she will need to apply for the [facility] discount to facilitate her referrals.  This note has been created with Education officer, environmental. Any transcriptional errors are unintentional.

## 2014-12-25 NOTE — Progress Notes (Signed)
Patient reports feeling fatigued and weak with shortness of breath She needs refill on her Lisinopril and has been out for 1 week She was supposed to be referred to nephrology but reports not hearing anything

## 2014-12-26 LAB — BRAIN NATRIURETIC PEPTIDE: BRAIN NATRIURETIC PEPTIDE: 437.3 pg/mL — AB (ref 0.0–100.0)

## 2014-12-27 ENCOUNTER — Telehealth: Payer: Self-pay | Admitting: *Deleted

## 2014-12-27 NOTE — Telephone Encounter (Signed)
-----   Message from Jaclyn ShaggyEnobong Amao, MD sent at 12/26/2014  4:58 PM EST ----- Blood work revealed normal potassium but kidney function has slightly worsened. Someone should be in touch with her with a referral to [facility] [facility].

## 2014-12-27 NOTE — Telephone Encounter (Signed)
Left HIPAA compliant voicemail message to return RN call at [phone]

## 2014-12-28 ENCOUNTER — Ambulatory Visit: Payer: PRIVATE HEALTH INSURANCE | Admitting: Family Medicine

## 2015-01-01 NOTE — Telephone Encounter (Signed)
Left HIPAA compliant voicemail for patient to return RN call at [phone].  HIPAA compliant letter mailed to patient.

## 2015-01-01 NOTE — Telephone Encounter (Signed)
-----   Message from Enobong Amao, MD sent at 12/26/2014  4:58 PM EST ----- Blood work revealed normal potassium but kidney function has slightly worsened. Someone should be in touch with her with a referral to  kidney. 

## 2015-01-04 ENCOUNTER — Ambulatory Visit: Payer: PRIVATE HEALTH INSURANCE | Admitting: Family Medicine

## 2015-01-04 ENCOUNTER — Ambulatory Visit (HOSPITAL_COMMUNITY)
Admission: RE | Admit: 2015-01-04 | Discharge: 2015-01-04 | Disposition: A | Discharge status: Home / Self Care | Payer: No Typology Code available for payment source | Source: Ambulatory Visit | Attending: Family Medicine | Admitting: Family Medicine

## 2015-01-04 DIAGNOSIS — I340 Nonrheumatic mitral (valve) insufficiency: Secondary | ICD-10-CM | POA: Diagnosis not present

## 2015-01-04 DIAGNOSIS — I071 Rheumatic tricuspid insufficiency: Secondary | ICD-10-CM | POA: Insufficient documentation

## 2015-01-04 DIAGNOSIS — R0600 Dyspnea, unspecified: Secondary | ICD-10-CM | POA: Diagnosis present

## 2015-01-04 DIAGNOSIS — I5042 Chronic combined systolic (congestive) and diastolic (congestive) heart failure: Secondary | ICD-10-CM | POA: Insufficient documentation

## 2015-01-04 DIAGNOSIS — I517 Cardiomegaly: Secondary | ICD-10-CM | POA: Insufficient documentation

## 2015-01-04 DIAGNOSIS — E119 Type 2 diabetes mellitus without complications: Secondary | ICD-10-CM | POA: Diagnosis not present

## 2015-01-04 NOTE — Progress Notes (Signed)
Echocardiogram 2D Echocardiogram has been performed.  Dorothey BasemanReel, Vauda Salvucci M 01/04/2015, 12:01 PM

## 2015-01-08 ENCOUNTER — Telehealth: Payer: Self-pay | Admitting: *Deleted

## 2015-01-08 NOTE — Telephone Encounter (Signed)
Left HIPAA compliant message for patient to return RN call at [phone] 

## 2015-01-08 NOTE — Telephone Encounter (Signed)
-----   Message from Jaclyn ShaggyEnobong Amao, MD sent at 01/08/2015  8:36 AM EST ----- 2d echo result is in keeping with heart failure

## 2015-01-10 ENCOUNTER — Ambulatory Visit: Payer: PRIVATE HEALTH INSURANCE | Admitting: Cardiology

## 2015-01-10 ENCOUNTER — Ambulatory Visit: Payer: PRIVATE HEALTH INSURANCE | Attending: Family Medicine | Admitting: Family Medicine

## 2015-01-10 ENCOUNTER — Encounter: Payer: Self-pay | Admitting: Family Medicine

## 2015-01-10 VITALS — BP 151/90 | HR 77 | Temp 98.2°F | Resp 13 | Ht 64.0 in | Wt 192.0 lb

## 2015-01-10 DIAGNOSIS — Z79899 Other long term (current) drug therapy: Secondary | ICD-10-CM | POA: Diagnosis not present

## 2015-01-10 DIAGNOSIS — N183 Chronic kidney disease, stage 3 unspecified: Secondary | ICD-10-CM

## 2015-01-10 DIAGNOSIS — R05 Cough: Secondary | ICD-10-CM | POA: Insufficient documentation

## 2015-01-10 DIAGNOSIS — I129 Hypertensive chronic kidney disease with stage 1 through stage 4 chronic kidney disease, or unspecified chronic kidney disease: Secondary | ICD-10-CM | POA: Insufficient documentation

## 2015-01-10 DIAGNOSIS — R0600 Dyspnea, unspecified: Secondary | ICD-10-CM

## 2015-01-10 DIAGNOSIS — E119 Type 2 diabetes mellitus without complications: Secondary | ICD-10-CM | POA: Diagnosis not present

## 2015-01-10 DIAGNOSIS — R5383 Other fatigue: Secondary | ICD-10-CM | POA: Diagnosis not present

## 2015-01-10 DIAGNOSIS — J4540 Moderate persistent asthma, uncomplicated: Secondary | ICD-10-CM | POA: Diagnosis not present

## 2015-01-10 DIAGNOSIS — I5042 Chronic combined systolic (congestive) and diastolic (congestive) heart failure: Secondary | ICD-10-CM

## 2015-01-10 DIAGNOSIS — I10 Essential (primary) hypertension: Secondary | ICD-10-CM

## 2015-01-10 DIAGNOSIS — G43009 Migraine without aura, not intractable, without status migrainosus: Secondary | ICD-10-CM

## 2015-01-10 LAB — CBC WITH DIFFERENTIAL/PLATELET
BASOS PCT: 1 % (ref 0–1)
Basophils Absolute: 0.1 10*3/uL (ref 0.0–0.1)
Eosinophils Absolute: 0.2 10*3/uL (ref 0.0–0.7)
Eosinophils Relative: 2 % (ref 0–5)
HCT: 37.2 % (ref 36.0–46.0)
HEMOGLOBIN: 12.1 g/dL (ref 12.0–15.0)
Lymphocytes Relative: 21 % (ref 12–46)
Lymphs Abs: 1.7 10*3/uL (ref 0.7–4.0)
MCH: 28.5 pg (ref 26.0–34.0)
MCHC: 32.5 g/dL (ref 30.0–36.0)
MCV: 87.7 fL (ref 78.0–100.0)
MPV: 11.3 fL (ref 8.6–12.4)
Monocytes Absolute: 0.6 10*3/uL (ref 0.1–1.0)
Monocytes Relative: 7 % (ref 3–12)
NEUTROS ABS: 5.7 10*3/uL (ref 1.7–7.7)
NEUTROS PCT: 69 % (ref 43–77)
PLATELETS: 333 10*3/uL (ref 150–400)
RBC: 4.24 MIL/uL (ref 3.87–5.11)
RDW: 14 % (ref 11.5–15.5)
WBC: 8.3 10*3/uL (ref 4.0–10.5)

## 2015-01-10 LAB — TSH: TSH: 2.044 u[IU]/mL (ref 0.350–4.500)

## 2015-01-10 NOTE — Progress Notes (Signed)
Subjective:  Patient ID: April [name], female    DOB: 09-Dec-1975  Age: 40 y.o. MRN: [id number]  CC: Follow-up   HPI April [name] is a 40 year old female with a history of hypertension, CHF, asthma, migraine, CK D, noncompliance who relocated from [place] a few months ago and had her blood pressure in the accelerated range at her last office visit for which hydralazine was added to her regimen. She had a 2-D echo in 12/2014 which revealed EF of 45-50%, diffuse hypokinesis, grade 2 diastolic dysfunction. She continues to complain of shortness of breath on mild exertion and orthopnea but denies pedal edema. She also has asthma and has been using her Qvar and albuterol inhalers as prescribed (she sometimes has a coughing fit but denies any wheezing or chest pains. Complains of being fatigued a lot and weak has to take several rest periods while performing her ADLs.  She was referred to [facility] for stage III CKD and was informed she has a 5 month weight.  Outpatient Prescriptions Prior to Visit  Medication Sig Dispense Refill  . albuterol (PROVENTIL HFA;VENTOLIN HFA) 108 (90 BASE) MCG/ACT inhaler Inhale 1 puff into the lungs every 6 (six) hours as needed for wheezing or shortness of breath.    Marland Kitchen. albuterol (PROVENTIL HFA;VENTOLIN HFA) 108 (90 BASE) MCG/ACT inhaler Inhale 2 puffs into the lungs every 4 (four) hours as needed for wheezing or shortness of breath. 1 Inhaler 0  . beclomethasone (QVAR) 80 MCG/ACT inhaler Inhale 1 puff into the lungs 2 (two) times daily. 1 Inhaler 3  . butalbital-acetaminophen-caffeine (FIORICET) 50-325-40 MG tablet Take 1-2 tablets by mouth every 6 (six) hours as needed for headache. 20 tablet 0  . carvedilol (COREG) 25 MG tablet Take 1 tablet (25 mg total) by mouth 2 (two) times daily with a meal. 60 tablet 2  . furosemide (LASIX) 40 MG tablet Take 1 tablet (40 mg total) by mouth 2 (two) times daily. 60 tablet 3  . hydrALAZINE (APRESOLINE) 50  MG tablet Take 1 tablet (50 mg total) by mouth 2 (two) times daily. 60 tablet 2  . lisinopril (PRINIVIL,ZESTRIL) 20 MG tablet Take 1 tablet (20 mg total) by mouth daily. 30 tablet 2  . promethazine (PHENERGAN) 25 MG tablet Take 1 tablet (25 mg total) by mouth every 6 (six) hours as needed for nausea or vomiting. 15 tablet 0  . topiramate (TOPAMAX) 50 MG tablet Take 1 tablet (50 mg total) by mouth 2 (two) times daily. 60 tablet 3   No facility-administered medications prior to visit.    ROS Review of Systems General: negative for fever, weight loss, appetite change, positive for fatigue Eyes: no visual symptoms. ENT: no ear symptoms, no sinus tenderness, no nasal congestion or sore throat. Neck: no pain  Respiratory: see hpi Cardiovascular: see hpi Gastrointestinal: no abdominal pain, no diarrhea, no constipation Genito-Urinary: no urinary frequency, no dysuria, no polyuria. Hematologic: no bruising Endocrine: no cold or heat intolerance Neurological: no headaches, no seizures, no tremors Musculoskeletal: no joint pains, no joint swelling Skin: no pruritus, no rash. Psychological: no depression, no anxiety,   Objective:  BP 151/90 mmHg  Pulse 77  Temp(Src) 98.2 F (36.8 C)  Resp 13  Ht 5\' 4"  (1.626 m)  Wt 192 lb (87.091 kg)  BMI 32.94 kg/m2  SpO2 97%  LMP 12/19/2014  BP/Weight 01/10/2015 12/25/2014 10/17/2014  Systolic BP 151 200 176  Diastolic BP 90 145 124  Wt. (Lbs) 192 191.2 193  BMI 32.94 32.8 33.11  Physical Exam Constitutional: normal appearing,  Eyes: PERRLA HEENT: Head is atraumatic, normal sinuses, normal oropharynx, normal appearing tonsils and palate, tympanic membrane is normal bilaterally. Neck: normal range of motion, no thyromegaly, no JVD Cardiovascular: normal rate and rhythm, normal heart sounds, no murmurs, rub or gallop, no pedal edema Respiratory: clear to auscultation bilaterally, no wheezes, no rales, no rhonchi Abdomen: soft, not tender  to palpation, normal bowel sounds, no enlarged organs Extremities: Full ROM, no tenderness in joints Skin: warm and dry, no lesions. Neurological: alert, oriented x3, cranial nerves I-XII grossly intact , normal motor strength, normal sensation. Psychological: normal mood.     ------------------------------------------------------------------- Transthoracic Echocardiography  Patient:  April [name], April [name] MR #:    [id number] Study Date: 01/04/2015 Gender:   F Age:    39 Height:   162.6 cm Weight:   86.6 kg BSA:    2.01 m^2 Pt. Status: Room:  ATTENDING  Jaclyn Shaggy [address] [place] [phone] [place], [place] [id number] SONOGRAPHER Jimmy Reel, RDCS PERFORMING  [facility], Outpatient  cc:  ------------------------------------------------------------------- LV EF: 45% -  50%  ------------------------------------------------------------------- Indications:   CHF - 428.0.  ------------------------------------------------------------------- History:  PMH:  Dyspnea. Risk factors: Diabetes mellitus.  ------------------------------------------------------------------- Study Conclusions  - Left ventricle: The cavity size was normal. There was moderate concentric hypertrophy. Systolic function was mildly reduced. The estimated ejection fraction was in the range of 45% to 50%. Diffuse hypokinesis. Features are consistent with a pseudonormal left ventricular filling pattern, with concomitant abnormal relaxation and increased filling pressure (grade 2 diastolic dysfunction). Doppler parameters are consistent with elevated ventricular end-diastolic filling pressure. - Aortic valve: Trileaflet; normal thickness leaflets. There was no regurgitation. - Aortic root: The aortic root was normal in size. - Mitral valve: Mildly thickened leaflets . There was mild regurgitation. - Left atrium: The atrium was  moderately dilated. - Right ventricle: The cavity size was normal. Wall thickness was normal. Systolic function was normal. - Tricuspid valve: There was moderate regurgitation. - Pulmonary arteries: Systolic pressure was moderately increased. PA peak pressure: 47 mm Hg (S). - Inferior vena cava: The vessel was normal in size. - Pericardium, extracardiac: There was no pericardial effusion.  Transthoracic echocardiography. M-mode, complete 2D, spectral Doppler, and color Doppler. Birthdate: Patient birthdate: May 03, 1975. Age: Patient is 40 yr old. Sex: Gender: female. BMI: 32.8 kg/m^2. Blood pressure:   200/145 Patient status: Outpatient. Study date: Study date: 01/04/2015. Study time: 11:23 AM. Location: Echo laboratory.  -------------------------------------------------------------------  ------------------------------------------------------------------- Left ventricle: The cavity size was normal. There was moderate concentric hypertrophy. Systolic function was mildly reduced. The estimated ejection fraction was in the range of 45% to 50%. Diffuse hypokinesis. Features are consistent with a pseudonormal left ventricular filling pattern, with concomitant abnormal relaxation and increased filling pressure (grade 2 diastolic dysfunction). Doppler parameters are consistent with elevated ventricular end-diastolic filling pressure.  ------------------------------------------------------------------- Aortic valve:  Trileaflet; normal thickness leaflets. Mobility was not restricted. Doppler: Transvalvular velocity was within the normal range. There was no stenosis. There was no regurgitation.  ------------------------------------------------------------------- Aorta: Aortic root: The aortic root was normal in size.  ------------------------------------------------------------------- Mitral valve:  Mildly thickened leaflets . Mobility was not restricted.  Doppler: Transvalvular velocity was within the normal range. There was no evidence for stenosis. There was mild regurgitation.  Peak gradient (D): 8 mm Hg.  ------------------------------------------------------------------- Left atrium: The atrium was moderately dilated.  ------------------------------------------------------------------- Right ventricle: The cavity size was normal. Wall thickness was normal. Systolic function was normal.  ------------------------------------------------------------------- Pulmonic valve:  Structurally normal valve.  Cusp separation was normal. Doppler: Transvalvular velocity was within the normal range. There was no evidence for stenosis. There was no regurgitation.  ------------------------------------------------------------------- Tricuspid valve:  Structurally normal valve.  Doppler: Transvalvular velocity was within the normal range. There was moderate regurgitation.  ------------------------------------------------------------------- Pulmonary artery:  The main pulmonary artery was normal-sized. Systolic pressure was moderately increased.  ------------------------------------------------------------------- Right atrium: The atrium was normal in size.  ------------------------------------------------------------------- Pericardium: There was no pericardial effusion.  ------------------------------------------------------------------- Systemic veins: Inferior vena cava: The vessel was normal in size.  Assessment & Plan:   1. Essential hypertension Uncontrolled but much improved compared to last visit when blood pressure was in the accelerated range. We will hold off on making any regimen changes at this time in the event that the medication changes that will be made at her cardiology visit. Advised on low sodium, DASH diet.  2. Asthma, moderate persistent, uncomplicated Symptoms of dyspnea currently overlap with CHF  symptoms. Previously placed on Advair which she did not do well on and so was switched back to Qvar If symptoms are not improved after cardiology assessment; she will need PFTs  3. Chronic kidney disease, stage 3 (moderate) Striking a balance between worsening kidney function and diuresis secondary to CHF will be challenging. We'll evaluate kidney function again today and if worsening we might need to call nephrology to expedite her referral. - COMPLETE METABOLIC PANEL WITH GFR  4. Chronic combined systolic and diastolic congestive heart failure (HCC) NYHA 3 EF of 45-50% from 2-D echo of 12/2014. She continues to be dyspneic even though she is still on Lasix. She missed her appointment with cardiology for today because she was not aware; we will call cardiology office to reschedule this for her. Limit fluid intake to less than 2 L per day, daily weight checks, low-sodium diet.  5. Migraine without aura and without status migrainosus, not intractable Controlled  6. Other fatigue Unknown etiology; will evaluate for metabolic causes - CBC with Differential - Vitamin D, 25-hydroxy - TSH  7. Dyspnea Be secondary to CHF exacerbation versus uncontrolled asthma - Brain natriuretic peptide     Follow-up: Return in about 1 month (around 02/10/2015) for Follow-up on CHF.   Jaclyn Shaggy MD

## 2015-01-10 NOTE — Progress Notes (Signed)
Patient here to follow up She states the nephrology office called and told her it was a five month wait She is taking all medications

## 2015-01-10 NOTE — Patient Instructions (Signed)

## 2015-01-11 ENCOUNTER — Telehealth: Payer: Self-pay | Admitting: *Deleted

## 2015-01-11 ENCOUNTER — Other Ambulatory Visit: Payer: Self-pay | Admitting: Family Medicine

## 2015-01-11 DIAGNOSIS — E559 Vitamin D deficiency, unspecified: Secondary | ICD-10-CM | POA: Insufficient documentation

## 2015-01-11 LAB — COMPLETE METABOLIC PANEL WITH GFR
ALBUMIN: 4.1 g/dL (ref 3.6–5.1)
ALK PHOS: 69 U/L (ref 33–115)
ALT: 8 U/L (ref 6–29)
AST: 15 U/L (ref 10–30)
BUN: 26 mg/dL — ABNORMAL HIGH (ref 7–25)
CALCIUM: 9.3 mg/dL (ref 8.6–10.2)
CO2: 23 mmol/L (ref 20–31)
Chloride: 109 mmol/L (ref 98–110)
Creat: 2.16 mg/dL — ABNORMAL HIGH (ref 0.50–1.10)
GFR, EST AFRICAN AMERICAN: 32 mL/min — AB (ref >=60)
GFR, EST NON AFRICAN AMERICAN: 28 mL/min — AB (ref >=60)
Glucose, Bld: 72 mg/dL (ref 65–99)
POTASSIUM: 4.7 mmol/L (ref 3.5–5.3)
SODIUM: 140 mmol/L (ref 135–146)
Total Bilirubin: 0.2 mg/dL (ref 0.2–1.2)
Total Protein: 7.1 g/dL (ref 6.1–8.1)

## 2015-01-11 LAB — BRAIN NATRIURETIC PEPTIDE: Brain Natriuretic Peptide: 126.7 pg/mL — ABNORMAL HIGH (ref 0.0–100.0)

## 2015-01-11 LAB — VITAMIN D 25 HYDROXY (VIT D DEFICIENCY, FRACTURES): VIT D 25 HYDROXY: 9 ng/mL — AB (ref 30–100)

## 2015-01-11 MED ORDER — ERGOCALCIFEROL 1.25 MG (50000 UT) PO CAPS: 50000.0000 [IU] | ORAL_CAPSULE | ORAL | Status: DC

## 2015-01-11 NOTE — Telephone Encounter (Signed)
-----   Message from Jaclyn ShaggyEnobong Amao, MD sent at 01/11/2015  1:40 PM EST ----- Labs revealed she is not anemic, thyroid is normal, kidney function is decreased but stable however she has a low vitamin D level which could explain her fatigue for which I have sent a prescription to the pharmacy for replacement.

## 2015-01-11 NOTE — Telephone Encounter (Signed)
Left HIPAA compliant message for patient to return call at [phone].

## 2015-01-14 ENCOUNTER — Ambulatory Visit: Payer: PRIVATE HEALTH INSURANCE | Admitting: Internal Medicine

## 2015-01-15 NOTE — Telephone Encounter (Signed)
Spoke to patient and gave results.  Patient states she has a cardiology appointment on 02/11/15.

## 2015-01-22 ENCOUNTER — Ambulatory Visit: Payer: PRIVATE HEALTH INSURANCE | Admitting: Cardiology

## 2015-01-24 ENCOUNTER — Emergency Department (HOSPITAL_COMMUNITY)
Admission: EM | Admit: 2015-01-24 | Discharge: 2015-01-24 | Disposition: A | Discharge status: Home / Self Care | Payer: PRIVATE HEALTH INSURANCE | Attending: Emergency Medicine | Admitting: Emergency Medicine

## 2015-01-24 ENCOUNTER — Encounter (HOSPITAL_COMMUNITY): Payer: Self-pay | Admitting: Emergency Medicine

## 2015-01-24 DIAGNOSIS — Z8739 Personal history of other diseases of the musculoskeletal system and connective tissue: Secondary | ICD-10-CM | POA: Insufficient documentation

## 2015-01-24 DIAGNOSIS — I10 Essential (primary) hypertension: Secondary | ICD-10-CM | POA: Insufficient documentation

## 2015-01-24 DIAGNOSIS — J45909 Unspecified asthma, uncomplicated: Secondary | ICD-10-CM | POA: Insufficient documentation

## 2015-01-24 DIAGNOSIS — F17200 Nicotine dependence, unspecified, uncomplicated: Secondary | ICD-10-CM | POA: Insufficient documentation

## 2015-01-24 DIAGNOSIS — Z7951 Long term (current) use of inhaled steroids: Secondary | ICD-10-CM | POA: Insufficient documentation

## 2015-01-24 DIAGNOSIS — Z79899 Other long term (current) drug therapy: Secondary | ICD-10-CM | POA: Insufficient documentation

## 2015-01-24 DIAGNOSIS — Z87448 Personal history of other diseases of urinary system: Secondary | ICD-10-CM | POA: Insufficient documentation

## 2015-01-24 DIAGNOSIS — G43809 Other migraine, not intractable, without status migrainosus: Secondary | ICD-10-CM | POA: Insufficient documentation

## 2015-01-24 MED ORDER — HYDRALAZINE HCL 20 MG/ML IJ SOLN
20.0000 mg | Freq: Once | INTRAMUSCULAR | Status: AC
  Administered 2015-01-24: 20 mg via INTRAVENOUS
  Filled 2015-01-24: qty 1

## 2015-01-24 MED ORDER — KETOROLAC TROMETHAMINE 30 MG/ML IJ SOLN
30.0000 mg | Freq: Once | INTRAMUSCULAR | Status: AC
  Administered 2015-01-24: 30 mg via INTRAVENOUS
  Filled 2015-01-24: qty 1

## 2015-01-24 MED ORDER — SODIUM CHLORIDE 0.9 % IV SOLN
Freq: Once | INTRAVENOUS | Status: AC
  Administered 2015-01-24: 04:00:00 via INTRAVENOUS

## 2015-01-24 MED ORDER — DIPHENHYDRAMINE HCL 50 MG/ML IJ SOLN
25.0000 mg | Freq: Once | INTRAMUSCULAR | Status: AC
  Administered 2015-01-24: 25 mg via INTRAVENOUS
  Filled 2015-01-24: qty 1

## 2015-01-24 MED ORDER — DEXAMETHASONE SODIUM PHOSPHATE 10 MG/ML IJ SOLN
10.0000 mg | Freq: Once | INTRAMUSCULAR | Status: AC
  Administered 2015-01-24: 10 mg via INTRAVENOUS
  Filled 2015-01-24: qty 1

## 2015-01-24 MED ORDER — PROCHLORPERAZINE EDISYLATE 5 MG/ML IJ SOLN
10.0000 mg | Freq: Once | INTRAMUSCULAR | Status: AC
  Administered 2015-01-24: 10 mg via INTRAVENOUS
  Filled 2015-01-24: qty 2

## 2015-01-24 NOTE — Discharge Instructions (Signed)
Hypertension °Hypertension, commonly called high blood pressure, is when the force of blood pumping through your arteries is too strong. Your arteries are the blood vessels that carry blood from your heart throughout your body. A blood pressure reading consists of a higher number over a lower number, such as 110/72. The higher number (systolic) is the pressure inside your arteries when your heart pumps. The lower number (diastolic) is the pressure inside your arteries when your heart relaxes. Ideally you want your blood pressure below 120/80. °Hypertension forces your heart to work harder to pump blood. Your arteries may become narrow or stiff. Having untreated or uncontrolled hypertension can cause heart attack, stroke, kidney disease, and other problems. °RISK FACTORS °Some risk factors for high blood pressure are controllable. Others are not.  °Risk factors you cannot control include:  °· Race. You may be at higher risk if you are African American. °· Age. Risk increases with age. °· Gender. Men are at higher risk than women before age 45 years. After age 65, women are at higher risk than men. °Risk factors you can control include: °· Not getting enough exercise or physical activity. °· Being overweight. °· Getting too much fat, sugar, calories, or salt in your diet. °· Drinking too much alcohol. °SIGNS AND SYMPTOMS °Hypertension does not usually cause signs or symptoms. Extremely high blood pressure (hypertensive crisis) may cause headache, anxiety, shortness of breath, and nosebleed. °DIAGNOSIS °To check if you have hypertension, your health care provider will measure your blood pressure while you are seated, with your arm held at the level of your heart. It should be measured at least twice using the same arm. Certain conditions can cause a difference in blood pressure between your right and left arms. A blood pressure reading that is higher than normal on one occasion does not mean that you need treatment. If  it is not clear whether you have high blood pressure, you may be asked to return on a different day to have your blood pressure checked again. Or, you may be asked to monitor your blood pressure at home for 1 or more weeks. °TREATMENT °Treating high blood pressure includes making lifestyle changes and possibly taking medicine. Living a healthy lifestyle can help lower high blood pressure. You may need to change some of your habits. °Lifestyle changes may include: °· Following the DASH diet. This diet is high in fruits, vegetables, and whole grains. It is low in salt, red meat, and added sugars. °· Keep your sodium intake below 2,300 mg per day. °· Getting at least 30-45 minutes of aerobic exercise at least 4 times per week. °· Losing weight if necessary. °· Not smoking. °· Limiting alcoholic beverages. °· Learning ways to reduce stress. °Your health care provider may prescribe medicine if lifestyle changes are not enough to get your blood pressure under control, and if one of the following is true: °· You are 18-59 years of age and your systolic blood pressure is above 140. °· You are 60 years of age or older, and your systolic blood pressure is above 150. °· Your diastolic blood pressure is above 90. °· You have diabetes, and your systolic blood pressure is over 140 or your diastolic blood pressure is over 90. °· You have kidney disease and your blood pressure is above 140/90. °· You have heart disease and your blood pressure is above 140/90. °Your personal target blood pressure may vary depending on your medical conditions, your age, and other factors. °HOME CARE INSTRUCTIONS °·   Have your blood pressure rechecked as directed by your health care provider.   °· Take medicines only as directed by your health care provider. Follow the directions carefully. Blood pressure medicines must be taken as prescribed. The medicine does not work as well when you skip doses. Skipping doses also puts you at risk for  problems. °· Do not smoke.   °· Monitor your blood pressure at home as directed by your health care provider.  °SEEK MEDICAL CARE IF:  °· You think you are having a reaction to medicines taken. °· You have recurrent headaches or feel dizzy. °· You have swelling in your ankles. °· You have trouble with your vision. °SEEK IMMEDIATE MEDICAL CARE IF: °· You develop a severe headache or confusion. °· You have unusual weakness, numbness, or feel faint. °· You have severe chest or abdominal pain. °· You vomit repeatedly. °· You have trouble breathing. °MAKE SURE YOU:  °· Understand these instructions. °· Will watch your condition. °· Will get help right away if you are not doing well or get worse. °  °This information is not intended to replace advice given to you by your health care provider. Make sure you discuss any questions you have with your health care provider. °  °Document Released: 12/22/2004 Document Revised: 05/08/2014 Document Reviewed: 10/14/2012 °Elsevier Interactive Patient Education ©2016 [employer] ° °Migraine Headache °A migraine headache is an intense, throbbing pain on one or both sides of your head. A migraine can last for 30 minutes to several hours. °CAUSES  °The exact cause of a migraine headache is not always known. However, a migraine may be caused when nerves in the brain become irritated and release chemicals that cause inflammation. This causes pain. °Certain things may also trigger migraines, such as: °· Alcohol. °· Smoking. °· Stress. °· Menstruation. °· Aged cheeses. °· Foods or drinks that contain nitrates, glutamate, aspartame, or tyramine. °· Lack of sleep. °· Chocolate. °· Caffeine. °· Hunger. °· Physical exertion. °· Fatigue. °· Medicines used to treat chest pain (nitroglycerine), birth control pills, estrogen, and some blood pressure medicines. °SIGNS AND SYMPTOMS °· Pain on one or both sides of your head. °· Pulsating or throbbing pain. °· Severe pain that prevents daily  activities. °· Pain that is aggravated by any physical activity. °· Nausea, vomiting, or both. °· Dizziness. °· Pain with exposure to bright lights, loud noises, or activity. °· General sensitivity to bright lights, loud noises, or smells. °Before you get a migraine, you may get warning signs that a migraine is coming (aura). An aura may include: °· Seeing flashing lights. °· Seeing bright spots, halos, or zigzag lines. °· Having tunnel vision or blurred vision. °· Having feelings of numbness or tingling. °· Having trouble talking. °· Having muscle weakness. °DIAGNOSIS  °A migraine headache is often diagnosed based on: °· Symptoms. °· Physical exam. °· A CT scan or MRI of your head. These imaging tests cannot diagnose migraines, but they can help rule out other causes of headaches. °TREATMENT °Medicines may be given for pain and nausea. Medicines can also be given to help prevent recurrent migraines.  °HOME CARE INSTRUCTIONS °· Only take over-the-counter or prescription medicines for pain or discomfort as directed by your health care provider. The use of long-term narcotics is not recommended. °· Lie down in a dark, quiet room when you have a migraine. °· Keep a journal to find out what may trigger your migraine headaches. For example, write down: °¨ What you eat and drink. °¨ How   much sleep you get. °¨ Any change to your diet or medicines. °· Limit alcohol consumption. °· Quit smoking if you smoke. °· Get 7-9 hours of sleep, or as recommended by your health care provider. °· Limit stress. °· Keep lights dim if bright lights bother you and make your migraines worse. °SEEK IMMEDIATE MEDICAL CARE IF:  °· Your migraine becomes severe. °· You have a fever. °· You have a stiff neck. °· You have vision loss. °· You have muscular weakness or loss of muscle control. °· You start losing your balance or have trouble walking. °· You feel faint or pass out. °· You have severe symptoms that are different from your first  symptoms. °MAKE SURE YOU:  °· Understand these instructions. °· Will watch your condition. °· Will get help right away if you are not doing well or get worse. °  °This information is not intended to replace advice given to you by your health care provider. Make sure you discuss any questions you have with your health care provider. °  °Document Released: 12/22/2004 Document Revised: 01/12/2014 Document Reviewed: 08/29/2012 °Elsevier Interactive Patient Education ©2016 [employer] ° °

## 2015-01-24 NOTE — ED Notes (Signed)
Pt left with all her belongings and ambulated out of the treatment area.  

## 2015-01-24 NOTE — ED Provider Notes (Signed)
CSN: [id number]     Arrival date & time 01/24/15  0316 History  By signing my name below, I, Marisue Humble, attest that this documentation has been prepared under the direction and in the presence of Gilda Crease, MD . Electronically Signed: Marisue Humble, Scribe. 01/24/2015. 3:39 AM.     Chief Complaint  Patient presents with  . Migraine  . Hypertension   The history is provided by the patient. No language interpreter was used.   HPI Comments:  April [name] is a 40 y.o. female with a Hx of HTN and migraine HA, who presents to the Emergency Department complaining of gradual onset, moderate HA onset 4-5 hours ago. She notes pain to temples, behind  her eyes and to posterior neck. Her current pain feels similar to past migraines. Pt also reports associated nausea, vomiting, and photophobia. Pt notes she has not had a migraine in a while and no longer has a prescription for migraine medicine. She denies numbness, tingling, and weakness in her extremities. No alleviating factors noted. Pt is compliant with HTN meds.  Past Medical History  Diagnosis Date  . Hypertension   . Renal disorder   . Arthritis   . Asthma    History reviewed. No pertinent past surgical history. Family History  Problem Relation Age of Onset  . Diabetes Mother   . Hypertension Mother   . Cancer Father    Social History  Substance Use Topics  . Smoking status: Current Every Day Smoker -- 0.25 packs/day  . Smokeless tobacco: None  . Alcohol Use: Yes     Comment: occasionally   OB History    No data available     Review of Systems  Eyes: Positive for photophobia.  Gastrointestinal: Positive for nausea and vomiting.  Neurological: Positive for headaches. Negative for weakness and numbness.       - Tingling  All other systems reviewed and are negative.  Allergies  Review of patient's allergies indicates no known allergies.  Home Medications   Prior to Admission medications    Medication Sig Start Date End Date Taking? Authorizing Provider  albuterol (PROVENTIL HFA;VENTOLIN HFA) 108 (90 BASE) MCG/ACT inhaler Inhale 1 puff into the lungs every 6 (six) hours as needed for wheezing or shortness of breath.    Historical Provider, MD  albuterol (PROVENTIL HFA;VENTOLIN HFA) 108 (90 BASE) MCG/ACT inhaler Inhale 2 puffs into the lungs every 4 (four) hours as needed for wheezing or shortness of breath. 10/16/14   Kristen N Ward, DO  beclomethasone (QVAR) 80 MCG/ACT inhaler Inhale 1 puff into the lungs 2 (two) times daily. 10/17/14   Jaclyn Shaggy, MD  butalbital-acetaminophen-caffeine (FIORICET) 50-325-40 MG tablet Take 1-2 tablets by mouth every 6 (six) hours as needed for headache. 10/16/14 10/16/15  Kristen N Ward, DO  carvedilol (COREG) 25 MG tablet Take 1 tablet (25 mg total) by mouth 2 (two) times daily with a meal. 12/25/14   Jaclyn Shaggy, MD  ergocalciferol (VITAMIN D2) 50000 units capsule Take 1 capsule (50,000 Units total) by mouth once a week. 01/11/15   Jaclyn Shaggy, MD  furosemide (LASIX) 40 MG tablet Take 1 tablet (40 mg total) by mouth 2 (two) times daily. 12/25/14   Jaclyn Shaggy, MD  hydrALAZINE (APRESOLINE) 50 MG tablet Take 1 tablet (50 mg total) by mouth 2 (two) times daily. 12/25/14   Jaclyn Shaggy, MD  lisinopril (PRINIVIL,ZESTRIL) 20 MG tablet Take 1 tablet (20 mg total) by mouth daily. 12/25/14   Jaclyn Shaggy, MD  promethazine (PHENERGAN) 25 MG tablet Take 1 tablet (25 mg total) by mouth every 6 (six) hours as needed for nausea or vomiting. 10/16/14   Kristen N Ward, DO  topiramate (TOPAMAX) 50 MG tablet Take 1 tablet (50 mg total) by mouth 2 (two) times daily. 10/17/14   Jaclyn Shaggy, MD   BP 138/91 mmHg  Pulse 95  Temp(Src) 97.5 F (36.4 C) (Oral)  SpO2 100%  LMP 12/19/2014 Physical Exam  Constitutional: She is oriented to person, place, and time. She appears well-developed and well-nourished. No distress.  HENT:  Head: Normocephalic and atraumatic.   Right Ear: Hearing normal.  Left Ear: Hearing normal.  Nose: Nose normal.  Mouth/Throat: Oropharynx is clear and moist and mucous membranes are normal.  Eyes: Conjunctivae and EOM are normal. Pupils are equal, round, and reactive to light.  Neck: Normal range of motion. Neck supple.  Cardiovascular: Regular rhythm, S1 normal and S2 normal.  Exam reveals no gallop and no friction rub.   No murmur heard. Pulmonary/Chest: Effort normal and breath sounds normal. No respiratory distress. She exhibits no tenderness.  Abdominal: Soft. Normal appearance and bowel sounds are normal. There is no hepatosplenomegaly. There is no tenderness. There is no rebound, no guarding, no tenderness at McBurney's point and negative Murphy's sign. No hernia.  Musculoskeletal: Normal range of motion.  Neurological: She is alert and oriented to person, place, and time. She has normal strength. No cranial nerve deficit or sensory deficit. Coordination normal. GCS eye subscore is 4. GCS verbal subscore is 5. GCS motor subscore is 6.  Skin: Skin is warm, dry and intact. No rash noted. No cyanosis.  Psychiatric: She has a normal mood and affect. Her speech is normal. Thought content normal.  Nursing note and vitals reviewed.   ED Course  Procedures  DIAGNOSTIC STUDIES:  Oxygen Saturation is 97% on RA, normal by my interpretation.    COORDINATION OF CARE:  3:28 AM Will administer medicine for migraine. Discussed treatment plan with pt at bedside and pt agreed to plan.   EKG Interpretation None      MDM   Final diagnoses:  Other type of migraine  Essential hypertension    Patient presents to the emergency room for further evaluation of migraine headache. Patient reports headache that started last night and has been aggressively worsened. She does have a history of migraines. Symptoms today including throbbing pounding headache, photophobia, nausea, vomiting or consistent with previous migraines. There is  nothing unusual about this headache compared to previous headaches. She has normal neurologic exam.  Patient is hypertensive. She has a history of hypertension, controlled by multiple agents.  Patient administered migraine cocktail and headache is rapidly improving. Blood pressure, however, remains elevated. Patient was given hydralazine and hypertension has resolved. Patient is now back to her baseline.  I personally performed the services described in this documentation, which was scribed in my presence. The recorded information has been reviewed and is accurate.     Gilda Crease, MD 01/24/15 [phone]

## 2015-01-24 NOTE — ED Notes (Signed)
Pt states chief complaint of headache that  started last night when she was going to bed. Pt with a history of migraine headaches and hypertension.

## 2015-01-30 ENCOUNTER — Encounter: Payer: Self-pay | Admitting: *Deleted

## 2015-02-11 ENCOUNTER — Ambulatory Visit: Payer: PRIVATE HEALTH INSURANCE | Admitting: Internal Medicine

## 2015-02-14 ENCOUNTER — Encounter (HOSPITAL_COMMUNITY): Payer: Self-pay | Admitting: Cardiology

## 2015-02-14 ENCOUNTER — Other Ambulatory Visit: Payer: Self-pay

## 2015-02-14 ENCOUNTER — Emergency Department (HOSPITAL_COMMUNITY)
Admission: EM | Admit: 2015-02-14 | Discharge: 2015-02-14 | Disposition: A | Discharge status: Home / Self Care | Payer: No Typology Code available for payment source | Attending: Emergency Medicine | Admitting: Emergency Medicine

## 2015-02-14 ENCOUNTER — Emergency Department (HOSPITAL_COMMUNITY): Payer: No Typology Code available for payment source

## 2015-02-14 DIAGNOSIS — Z87448 Personal history of other diseases of urinary system: Secondary | ICD-10-CM | POA: Insufficient documentation

## 2015-02-14 DIAGNOSIS — Z8739 Personal history of other diseases of the musculoskeletal system and connective tissue: Secondary | ICD-10-CM | POA: Insufficient documentation

## 2015-02-14 DIAGNOSIS — R799 Abnormal finding of blood chemistry, unspecified: Secondary | ICD-10-CM

## 2015-02-14 DIAGNOSIS — I129 Hypertensive chronic kidney disease with stage 1 through stage 4 chronic kidney disease, or unspecified chronic kidney disease: Secondary | ICD-10-CM | POA: Diagnosis not present

## 2015-02-14 DIAGNOSIS — Z7951 Long term (current) use of inhaled steroids: Secondary | ICD-10-CM | POA: Insufficient documentation

## 2015-02-14 DIAGNOSIS — R05 Cough: Secondary | ICD-10-CM

## 2015-02-14 DIAGNOSIS — N189 Chronic kidney disease, unspecified: Secondary | ICD-10-CM | POA: Diagnosis not present

## 2015-02-14 DIAGNOSIS — F17210 Nicotine dependence, cigarettes, uncomplicated: Secondary | ICD-10-CM | POA: Diagnosis not present

## 2015-02-14 DIAGNOSIS — R079 Chest pain, unspecified: Secondary | ICD-10-CM | POA: Insufficient documentation

## 2015-02-14 DIAGNOSIS — J45901 Unspecified asthma with (acute) exacerbation: Secondary | ICD-10-CM | POA: Diagnosis not present

## 2015-02-14 DIAGNOSIS — Z79899 Other long term (current) drug therapy: Secondary | ICD-10-CM | POA: Diagnosis not present

## 2015-02-14 DIAGNOSIS — R197 Diarrhea, unspecified: Secondary | ICD-10-CM | POA: Diagnosis not present

## 2015-02-14 DIAGNOSIS — R112 Nausea with vomiting, unspecified: Secondary | ICD-10-CM | POA: Diagnosis not present

## 2015-02-14 DIAGNOSIS — R059 Cough, unspecified: Secondary | ICD-10-CM

## 2015-02-14 LAB — LIPASE, BLOOD: Lipase: 52 U/L — ABNORMAL HIGH (ref 11–51)

## 2015-02-14 LAB — URINE MICROSCOPIC-ADD ON

## 2015-02-14 LAB — CBC WITH DIFFERENTIAL/PLATELET
Basophils Absolute: 0 10*3/uL (ref 0.0–0.1)
Basophils Relative: 1 %
Eosinophils Absolute: 0 10*3/uL (ref 0.0–0.7)
Eosinophils Relative: 1 %
HCT: 35.9 % — ABNORMAL LOW (ref 36.0–46.0)
Hemoglobin: 11.9 g/dL — ABNORMAL LOW (ref 12.0–15.0)
Lymphocytes Relative: 16 %
Lymphs Abs: 0.6 10*3/uL — ABNORMAL LOW (ref 0.7–4.0)
MCH: 29 pg (ref 26.0–34.0)
MCHC: 33.1 g/dL (ref 30.0–36.0)
MCV: 87.3 fL (ref 78.0–100.0)
Monocytes Absolute: 0.5 10*3/uL (ref 0.1–1.0)
Monocytes Relative: 12 %
Neutro Abs: 2.6 10*3/uL (ref 1.7–7.7)
Neutrophils Relative %: 70 %
Platelets: 203 10*3/uL (ref 150–400)
RBC: 4.11 MIL/uL (ref 3.87–5.11)
RDW: 14.8 % (ref 11.5–15.5)
WBC: 3.7 10*3/uL — ABNORMAL LOW (ref 4.0–10.5)

## 2015-02-14 LAB — COMPREHENSIVE METABOLIC PANEL
ALBUMIN: 3.5 g/dL (ref 3.5–5.0)
ALK PHOS: 54 U/L (ref 38–126)
ALT: 13 U/L — ABNORMAL LOW (ref 14–54)
ANION GAP: 13 (ref 5–15)
AST: 28 U/L (ref 15–41)
BILIRUBIN TOTAL: 0.3 mg/dL (ref 0.3–1.2)
BUN: 19 mg/dL (ref 6–20)
CALCIUM: 8.6 mg/dL — AB (ref 8.9–10.3)
CO2: 17 mmol/L — ABNORMAL LOW (ref 22–32)
Chloride: 106 mmol/L (ref 101–111)
Creatinine, Ser: 2.59 mg/dL — ABNORMAL HIGH (ref 0.44–1.00)
GFR, EST AFRICAN AMERICAN: 25 mL/min — AB (ref >60)
GFR, EST NON AFRICAN AMERICAN: 22 mL/min — AB (ref >60)
GLUCOSE: 109 mg/dL — AB (ref 65–99)
POTASSIUM: 3.7 mmol/L (ref 3.5–5.1)
Sodium: 136 mmol/L (ref 135–145)
TOTAL PROTEIN: 7.2 g/dL (ref 6.5–8.1)

## 2015-02-14 LAB — URINALYSIS, ROUTINE W REFLEX MICROSCOPIC
Bilirubin Urine: NEGATIVE
GLUCOSE, UA: NEGATIVE mg/dL
Ketones, ur: NEGATIVE mg/dL
Leukocytes, UA: NEGATIVE
Nitrite: NEGATIVE
PROTEIN: 100 mg/dL — AB
SPECIFIC GRAVITY, URINE: 1.016 (ref 1.005–1.030)
pH: 5 (ref 5.0–8.0)

## 2015-02-14 MED ORDER — GUAIFENESIN 100 MG/5ML PO SOLN: 5.0000 mL | Freq: Four times a day (QID) | ORAL | Status: DC | PRN

## 2015-02-14 MED ORDER — ACETAMINOPHEN 325 MG PO TABS: 650.0000 mg | ORAL_TABLET | Freq: Once | ORAL | Status: DC

## 2015-02-14 MED ORDER — ALBUTEROL SULFATE HFA 108 (90 BASE) MCG/ACT IN AERS
1.0000 | INHALATION_SPRAY | Freq: Once | RESPIRATORY_TRACT | Status: AC
  Administered 2015-02-14: 2 via RESPIRATORY_TRACT
  Filled 2015-02-14: qty 6.7

## 2015-02-14 MED ORDER — BENZONATATE 100 MG PO CAPS
100.0000 mg | ORAL_CAPSULE | Freq: Once | ORAL | Status: AC
  Administered 2015-02-14: 100 mg via ORAL
  Filled 2015-02-14: qty 1

## 2015-02-14 MED ORDER — GUAIFENESIN 100 MG/5ML PO SOLN
5.0000 mL | Freq: Once | ORAL | Status: AC
  Administered 2015-02-14: 100 mg via ORAL
  Filled 2015-02-14: qty 5

## 2015-02-14 MED ORDER — ONDANSETRON HCL 4 MG/2ML IJ SOLN
4.0000 mg | Freq: Once | INTRAMUSCULAR | Status: AC
  Administered 2015-02-14: 4 mg via INTRAVENOUS
  Filled 2015-02-14: qty 2

## 2015-02-14 MED ORDER — ACETAMINOPHEN 325 MG PO TABS
325.0000 mg | ORAL_TABLET | Freq: Once | ORAL | Status: AC
  Administered 2015-02-14: 325 mg via ORAL
  Filled 2015-02-14: qty 1

## 2015-02-14 MED ORDER — SODIUM CHLORIDE 0.9 % IV BOLUS (SEPSIS)
1000.0000 mL | Freq: Once | INTRAVENOUS | Status: AC
Start: 2015-02-14 — End: 2015-02-14
  Administered 2015-02-14: 1000 mL via INTRAVENOUS

## 2015-02-14 MED ORDER — HYDROCODONE-ACETAMINOPHEN 5-325 MG PO TABS
1.0000 | ORAL_TABLET | Freq: Once | ORAL | Status: AC
  Administered 2015-02-14: 1 via ORAL
  Filled 2015-02-14: qty 1

## 2015-02-14 NOTE — ED Notes (Signed)
Pt reports a cough, fever and diarrhea over the past 2 days.Recently around sick child.

## 2015-02-14 NOTE — ED Notes (Signed)
Pt returned from X-ray.  

## 2015-02-14 NOTE — ED Notes (Signed)
MD at the bedside  

## 2015-02-14 NOTE — Discharge Instructions (Signed)
Please go to your doctors office tomorrow to obtain blood work which is already ordered.  You will need to show up at 9:15. On Tuesday, please go to an appointment with your doctor at 11:30am.   Cough, Adult A cough helps to clear your throat and lungs. A cough may last only 2-3 weeks (acute), or it may last longer than 8 weeks (chronic). Many different things can cause a cough. A cough may be a sign of an illness or another medical condition. HOME CARE  Pay attention to any changes in your cough.  Take medicines only as told by your doctor.  If you were prescribed an antibiotic medicine, take it as told by your doctor. Do not stop taking it even if you start to feel better.  Talk with your doctor before you try using a cough medicine.  Drink enough fluid to keep your pee (urine) clear or pale yellow.  If the air is dry, use a cold steam vaporizer or humidifier in your home.  Stay away from things that make you cough at work or at home.  If your cough is worse at night, try using extra pillows to raise your head up higher while you sleep.  Do not smoke, and try not to be around smoke. If you need help quitting, ask your doctor.  Do not have caffeine.  Do not drink alcohol.  Rest as needed. GET HELP IF:  You have new problems (symptoms).  You cough up yellow fluid (pus).  Your cough does not get better after 2-3 weeks, or your cough gets worse.  Medicine does not help your cough and you are not sleeping well.  You have pain that gets worse or pain that is not helped with medicine.  You have a fever.  You are losing weight and you do not know why.  You have night sweats. GET HELP RIGHT AWAY IF:  You cough up blood.  You have trouble breathing.  Your heartbeat is very fast.   This information is not intended to replace advice given to you by your health care provider. Make sure you discuss any questions you have with your health care provider.   Document  Released: 09/04/2010 Document Revised: 09/12/2014 Document Reviewed: 02/28/2014 Elsevier Interactive Patient Education 2016 [employer] Acute Kidney Injury Acute kidney injury is any condition in which there is sudden (acute) damage to the kidneys. Acute kidney injury was previously known as acute kidney failure or acute renal failure. The kidneys are two organs that lie on either side of the spine between the middle of the back and the front of the abdomen. The kidneys:  Remove wastes and extra water from the blood.   Produce important hormones. These help keep bones strong, regulate blood pressure, and help create red blood cells.   Balance the fluids and chemicals in the blood and tissues. A small amount of kidney damage may not cause problems, but a large amount of damage may make it difficult or impossible for the kidneys to work the way they should. Acute kidney injury may develop into long-lasting (chronic) kidney disease. It may also develop into a life-threatening disease called end-stage kidney disease. Acute kidney injury can get worse very quickly, so it should be treated right away. Early treatment may prevent other kidney diseases from developing. CAUSES   A problem with blood flow to the kidneys. This may be caused by:   Blood loss.   Heart disease.   Severe burns.  Liver disease.  Direct damage to the kidneys. This may be caused by:  Some medicines.   A kidney infection.   Poisoning or consuming toxic substances.   A surgical wound.   A blow to the kidney area.   A problem with urine flow. This may be caused by:   Cancer.   Kidney stones.   An enlarged prostate. SIGNS AND SYMPTOMS   Swelling (edema) of the legs, ankles, or feet.   Tiredness (lethargy).   Nausea or vomiting.   Confusion.   Problems with urination, such as:   Painful or burning feeling during urination.   Decreased urine production.   Frequent accidents  in children who are potty trained.   Bloody urine.   Muscle twitches and cramps.   Shortness of breath.   Seizures.   Chest pain or pressure. Sometimes, no symptoms are present. DIAGNOSIS Acute kidney injury may be detected and diagnosed by tests, including blood, urine, imaging, or kidney biopsy tests.  TREATMENT Treatment of acute kidney injury varies depending on the cause and severity of the kidney damage. In mild cases, no treatment may be needed. The kidneys may heal on their own. If acute kidney injury is more severe, your health care provider will treat the cause of the kidney damage, help the kidneys heal, and prevent complications from occurring. Severe cases may require a procedure to remove toxic wastes from the body (dialysis) or surgery to repair kidney damage. Surgery may involve:   Repair of a torn kidney.   Removal of an obstruction. HOME CARE INSTRUCTIONS  Follow your prescribed diet.  Take medicines only as directed by your health care provider.  Do not take any new medicines (prescription, over-the-counter, or nutritional supplements) unless approved by your health care provider. Many medicines can worsen your kidney damage or may need to have the dose adjusted.   Keep all follow-up visits as directed by your health care provider. This is important.  Observe your condition to make sure you are healing as expected. SEEK IMMEDIATE MEDICAL CARE IF:  You are feeling ill or have severe pain in the back or side.   Your symptoms return or you have new symptoms.  You have any symptoms of end-stage kidney disease. These include:   Persistent itchiness.   Loss of appetite.   Headaches.   Abnormally dark or light skin.  Numbness in the hands or feet.   Easy bruising.   Frequent hiccups.   Menstruation stops.   You have a fever.  You have increased urine production.  You have pain or bleeding when urinating. MAKE SURE YOU:    Understand these instructions.  Will watch your condition.  Will get help right away if you are not doing well or get worse.   This information is not intended to replace advice given to you by your health care provider. Make sure you discuss any questions you have with your health care provider.   Document Released: 07/07/2010 Document Revised: 01/12/2014 Document Reviewed: 08/21/2011 Elsevier Interactive Patient Education [employer]

## 2015-02-14 NOTE — ED Provider Notes (Signed)
CSN: [id number]     Arrival date & time 02/14/15  4098 History   First MD Initiated Contact with Patient 02/14/15 0801     Chief Complaint  Patient presents with  . Fever  . Cough  . Diarrhea     (Consider location/radiation/quality/duration/timing/severity/associated sxs/prior Treatment) HPI   20 y f w pmh htn, ckd, asthma who comes in with two days of cough which has been minimally productive.  The cough has been associated with multiple episodes of non-bloody diarrhea.  She has also had some NBNB emesis as well as sharp central chest pain, only while coughing.  If she isn't coughing she has no chest pain.  No leg swelling, no previous dvt/pe.  No other sx.  Past Medical History  Diagnosis Date  . Hypertension   . Renal disorder   . Arthritis   . Asthma    History reviewed. No pertinent past surgical history. Family History  Problem Relation Age of Onset  . Diabetes Mother   . Hypertension Mother   . Cancer Father   . Cancer Maternal Grandfather   . Cancer Paternal Grandfather   . Diabetes Maternal Grandmother    Social History  Substance Use Topics  . Smoking status: Current Every Day Smoker -- 0.25 packs/day    Types: Cigarettes  . Smokeless tobacco: None  . Alcohol Use: Yes     Comment: occasionally   OB History    No data available     Review of Systems  Constitutional: Negative for fever and chills.  HENT: Negative for nosebleeds.   Eyes: Negative for visual disturbance.  Respiratory: Positive for cough and shortness of breath.   Cardiovascular: Positive for chest pain.  Gastrointestinal: Positive for nausea, vomiting and diarrhea. Negative for abdominal pain and constipation.  Genitourinary: Negative for dysuria.  Skin: Negative for rash.  Neurological: Negative for weakness.  All other systems reviewed and are negative.     Allergies  Review of patient's allergies indicates no known allergies.  Home Medications   Prior to Admission medications    Medication Sig Start Date End Date Taking? Authorizing Provider  albuterol (PROVENTIL HFA;VENTOLIN HFA) 108 (90 BASE) MCG/ACT inhaler Inhale 1 puff into the lungs every 6 (six) hours as needed for wheezing or shortness of breath.   Yes Historical Provider, MD  albuterol (PROVENTIL HFA;VENTOLIN HFA) 108 (90 BASE) MCG/ACT inhaler Inhale 2 puffs into the lungs every 4 (four) hours as needed for wheezing or shortness of breath. 10/16/14  Yes Kristen N Ward, DO  beclomethasone (QVAR) 80 MCG/ACT inhaler Inhale 1 puff into the lungs 2 (two) times daily. 10/17/14  Yes Jaclyn Shaggy, MD  butalbital-acetaminophen-caffeine (FIORICET) 50-325-40 MG tablet Take 1-2 tablets by mouth every 6 (six) hours as needed for headache. 10/16/14 10/16/15 Yes Kristen N Ward, DO  carvedilol (COREG) 25 MG tablet Take 1 tablet (25 mg total) by mouth 2 (two) times daily with a meal. 12/25/14  Yes Jaclyn Shaggy, MD  ergocalciferol (VITAMIN D2) 50000 units capsule Take 1 capsule (50,000 Units total) by mouth once a week. 01/11/15  Yes Jaclyn Shaggy, MD  furosemide (LASIX) 40 MG tablet Take 1 tablet (40 mg total) by mouth 2 (two) times daily. 12/25/14  Yes Jaclyn Shaggy, MD  hydrALAZINE (APRESOLINE) 50 MG tablet Take 1 tablet (50 mg total) by mouth 2 (two) times daily. 12/25/14  Yes Jaclyn Shaggy, MD  lisinopril (PRINIVIL,ZESTRIL) 20 MG tablet Take 1 tablet (20 mg total) by mouth daily. 12/25/14  Yes Jaclyn Shaggy,  MD  topiramate (TOPAMAX) 50 MG tablet Take 1 tablet (50 mg total) by mouth 2 (two) times daily. 10/17/14  Yes Jaclyn Shaggy, MD  guaiFENesin (ROBITUSSIN) 100 MG/5ML SOLN Take 5 mLs (100 mg total) by mouth every 6 (six) hours as needed for cough or to loosen phlegm. 02/14/15   Silas Flood, MD  promethazine (PHENERGAN) 25 MG tablet Take 1 tablet (25 mg total) by mouth every 6 (six) hours as needed for nausea or vomiting. 10/16/14   Kristen N Ward, DO   BP 157/87 mmHg  Pulse 79  Temp(Src) 98.5 F (36.9 C) (Oral)  Resp 23  Wt 87.091  kg  SpO2 99%  LMP 02/04/2015 Physical Exam  Constitutional: She is oriented to person, place, and time. No distress.  HENT:  Head: Normocephalic and atraumatic.  Eyes: EOM are normal. Pupils are equal, round, and reactive to light.  Neck: Normal range of motion. Neck supple.  Cardiovascular: Normal rate and intact distal pulses.   Pulmonary/Chest: No respiratory distress. She has no wheezes. She has no rales.  Abdominal: Soft. There is no tenderness. There is no rebound and no guarding.  Musculoskeletal: Normal range of motion.  Neurological: She is alert and oriented to person, place, and time.  Skin: No rash noted. She is not diaphoretic.  Psychiatric: She has a normal mood and affect.    ED Course  Procedures (including critical care time) Labs Review Labs Reviewed  CBC WITH DIFFERENTIAL/PLATELET - Abnormal; Notable for the following:    WBC 3.7 (*)    Hemoglobin 11.9 (*)    HCT 35.9 (*)    Lymphs Abs 0.6 (*)    All other components within normal limits  COMPREHENSIVE METABOLIC PANEL - Abnormal; Notable for the following:    CO2 17 (*)    Glucose, Bld 109 (*)    Creatinine, Ser 2.59 (*)    Calcium 8.6 (*)    ALT 13 (*)    GFR calc non Af Amer 22 (*)    GFR calc Af Amer 25 (*)    All other components within normal limits  LIPASE, BLOOD - Abnormal; Notable for the following:    Lipase 52 (*)    All other components within normal limits  URINALYSIS, ROUTINE W REFLEX MICROSCOPIC (NOT AT [facility]) - Abnormal; Notable for the following:    Hgb urine dipstick SMALL (*)    Protein, ur 100 (*)    All other components within normal limits  URINE MICROSCOPIC-ADD ON - Abnormal; Notable for the following:    Squamous Epithelial / LPF 0-5 (*)    Bacteria, UA MANY (*)    Casts GRANULAR CAST (*)    All other components within normal limits  URINE CULTURE    Imaging Review Dg Chest 2 View  02/14/2015  CLINICAL DATA:  Cough, shortness of breath and chest pain for 2 days, history  hypertension, smoking EXAM: CHEST  2 VIEW COMPARISON:  10/16/2014 FINDINGS: Upper normal heart size. Mediastinal contours and pulmonary vascularity normal. Minimal chronic peribronchial thickening. No pulmonary infiltrate, pleural effusion or pneumothorax. Bones unremarkable. IMPRESSION: Minimal chronic bronchitic changes without infiltrate. Electronically Signed   By: Ulyses Southward M.D.   On: 02/14/2015 08:44   I have personally reviewed and evaluated these images and lab results as part of my medical decision-making.   EKG Interpretation   Date/Time:  Thursday February 14 2015 08:50:48 EST Ventricular Rate:  88 PR Interval:  128 QRS Duration: 78 QT Interval:  389 QTC Calculation:  471 R Axis:   76 Text Interpretation:  Sinus rhythm Biatrial enlargement Probable left  ventricular hypertrophy Nonspecific T abnormalities, lateral leads ED  PHYSICIAN INTERPRETATION AVAILABLE IN [facility] Confirmed by TEST,  Record ([id number]) on 02/15/2015 7:16:27 AM      MDM   Final diagnoses:  Cough    39 y f w pmh htn, ckd, asthma who comes in with two days of cough, n/v/d.  No other sx  Exam as above.  Febrile to 100.4.  No sig other PE findings.  Will obtain cbc/cmp/lipase.  Will obtain cxr to eval for pna and give fluid bolus.  Will obtain ekg to screen for acs but feel very unlikely given sharp  chest pain only while coughing.  Pt without any abdominal ttp.  Doubt dangerous intra-abdominal cause of pt sx.  This is likely viral illness.   Patient coughing in the ED and have tried multiple meds including tessalon pearls, robitussin, albut.  The only med that seemed to help is robitussin.  cxr w/o infiltrate.  Cbc/bmp remarkable for creatinine of 2.59 which is above patients baseline of 2.1.  Cbc unremarkable.  ua unrmearkable.  Lipase w/ very mild elevation.  ekg w/o sig ischemic changes, some non-specific t wave changes.  Doubt acs, doubt pe.  Doubt pna given no infiltrate on cxr.  Have given fluid  bolus for possible dehydration as cause of small aki.  I have offered admission for aki but patient would rather go home.  I have contacted the pt's pcp office and they will have her come to clinic tomorrow for repeat labs.  I have scheduled a f/u appointment with her pcp on Tuesday of this week. She is able to tolerate PO in the ED but will return to the ED if she has any difficulty tolerating PO or worsening sx.   Silas Flood, MD 02/15/15 1510  Tilden Fossa, MD 02/16/15 [phone]

## 2015-02-15 ENCOUNTER — Other Ambulatory Visit: Payer: PRIVATE HEALTH INSURANCE

## 2015-02-15 LAB — URINE CULTURE

## 2015-02-19 ENCOUNTER — Inpatient Hospital Stay: Payer: PRIVATE HEALTH INSURANCE | Admitting: Family Medicine

## 2015-04-05 ENCOUNTER — Emergency Department (HOSPITAL_COMMUNITY)
Admission: EM | Admit: 2015-04-05 | Discharge: 2015-04-05 | Disposition: A | Discharge status: Home / Self Care | Payer: No Typology Code available for payment source | Attending: Emergency Medicine | Admitting: Emergency Medicine

## 2015-04-05 ENCOUNTER — Encounter (HOSPITAL_COMMUNITY): Payer: Self-pay | Admitting: *Deleted

## 2015-04-05 DIAGNOSIS — I509 Heart failure, unspecified: Secondary | ICD-10-CM | POA: Insufficient documentation

## 2015-04-05 DIAGNOSIS — F17210 Nicotine dependence, cigarettes, uncomplicated: Secondary | ICD-10-CM | POA: Insufficient documentation

## 2015-04-05 DIAGNOSIS — I10 Essential (primary) hypertension: Secondary | ICD-10-CM | POA: Insufficient documentation

## 2015-04-05 DIAGNOSIS — Z3202 Encounter for pregnancy test, result negative: Secondary | ICD-10-CM | POA: Insufficient documentation

## 2015-04-05 DIAGNOSIS — R040 Epistaxis: Secondary | ICD-10-CM | POA: Insufficient documentation

## 2015-04-05 DIAGNOSIS — M1990 Unspecified osteoarthritis, unspecified site: Secondary | ICD-10-CM | POA: Insufficient documentation

## 2015-04-05 DIAGNOSIS — Z87448 Personal history of other diseases of urinary system: Secondary | ICD-10-CM | POA: Insufficient documentation

## 2015-04-05 DIAGNOSIS — Z79899 Other long term (current) drug therapy: Secondary | ICD-10-CM | POA: Insufficient documentation

## 2015-04-05 DIAGNOSIS — Z7951 Long term (current) use of inhaled steroids: Secondary | ICD-10-CM | POA: Insufficient documentation

## 2015-04-05 DIAGNOSIS — J449 Chronic obstructive pulmonary disease, unspecified: Secondary | ICD-10-CM | POA: Insufficient documentation

## 2015-04-05 HISTORY — DX: Chronic obstructive pulmonary disease, unspecified: J44.9

## 2015-04-05 LAB — CBC
HCT: 31.8 % — ABNORMAL LOW (ref 36.0–46.0)
Hemoglobin: 10.3 g/dL — ABNORMAL LOW (ref 12.0–15.0)
MCH: 29.2 pg (ref 26.0–34.0)
MCHC: 32.4 g/dL (ref 30.0–36.0)
MCV: 90.1 fL (ref 78.0–100.0)
PLATELETS: 253 10*3/uL (ref 150–400)
RBC: 3.53 MIL/uL — AB (ref 3.87–5.11)
RDW: 14.8 % (ref 11.5–15.5)
WBC: 7.8 10*3/uL (ref 4.0–10.5)

## 2015-04-05 LAB — I-STAT BETA HCG BLOOD, ED (MC, WL, AP ONLY)

## 2015-04-05 MED ORDER — LISINOPRIL 20 MG PO TABS: 20.0000 mg | ORAL_TABLET | Freq: Every day | ORAL | Status: DC

## 2015-04-05 MED ORDER — OXYMETAZOLINE HCL 0.05 % NA SOLN: 1.0000 | Freq: Two times a day (BID) | NASAL | Status: AC | PRN

## 2015-04-05 NOTE — Discharge Instructions (Signed)
Please read and follow all provided instructions.  Your diagnoses today include:  1. Epistaxis    Tests performed today include:  Vital signs. See below for your results today.   Medications prescribed:   Take any as prescribed   Home care instructions:  Read the educational materials provided and follow any instructions contained in this packet.  If your nosebleed happens again: Pinch your nose and hold for 15 minutes without letting go.  If this does not stop the bleeding, pinch and hold for another 15 minutes.  If it continues to bleed after this, please come to the Emergency Department or see your family doctor.   Follow-up instructions: Please follow-up with your primary care provider in the next 3 days for further evaluation of your symptoms.   Return instructions:   Please return to the Emergency Department if you experience worsening symptoms.   Please return if you have any other emergent concerns.  Additional Information:  Your vital signs today were: BP 172/153 mmHg   Pulse 99   Temp(Src) 98.3 F (36.8 C) (Oral)   Resp 21   SpO2 100%   LMP 03/10/2015 If your blood pressure (BP) was elevated above 135/85 this visit, please have this repeated by your doctor within one month. --------------

## 2015-04-05 NOTE — ED Notes (Signed)
Pt states spontaneous nose bleed, off and on throughout the day and night.  States now clots are quite large.  Airway patent.  O2 sats 98%.

## 2015-04-05 NOTE — ED Provider Notes (Signed)
CSN: [id number]     Arrival date & time 04/05/15  1620 History   First MD Initiated Contact with Patient 04/05/15 1818     No chief complaint on file.  (Consider location/radiation/quality/duration/timing/severity/associated sxs/prior Treatment) HPI 40 y.o. female with a hx of HTN, presents to the Emergency Department today complaining of nosebleed that started last night from her right nare. States that this is the first time it has happened. Pt not on blood thinners. Has history of HTN that is being followed closely by PCP. No headache. No changes in vision. No CP/SOB/ABD pain. No N/V/D. No pain. No fevers. No other symptoms noted.   Past Medical History  Diagnosis Date  . Hypertension   . Renal disorder   . Arthritis   . Asthma   . COPD (chronic obstructive pulmonary disease) (HCC)   . CHF (congestive heart failure) (HCC)    History reviewed. No pertinent past surgical history. Family History  Problem Relation Age of Onset  . Diabetes Mother   . Hypertension Mother   . Cancer Father   . Cancer Maternal Grandfather   . Cancer Paternal Grandfather   . Diabetes Maternal Grandmother    Social History  Substance Use Topics  . Smoking status: Current Every Day Smoker -- 0.25 packs/day    Types: Cigarettes  . Smokeless tobacco: None  . Alcohol Use: Yes     Comment: occasionally   OB History    No data available     Review of Systems ROS reviewed and all are negative for acute change except as noted in the HPI.  Allergies  Review of patient's allergies indicates no known allergies.  Home Medications   Prior to Admission medications   Medication Sig Start Date End Date Taking? Authorizing Provider  albuterol (PROVENTIL HFA;VENTOLIN HFA) 108 (90 BASE) MCG/ACT inhaler Inhale 1 puff into the lungs every 6 (six) hours as needed for wheezing or shortness of breath.    Historical Provider, MD  albuterol (PROVENTIL HFA;VENTOLIN HFA) 108 (90 BASE) MCG/ACT inhaler Inhale 2 puffs  into the lungs every 4 (four) hours as needed for wheezing or shortness of breath. 10/16/14   Kristen N Ward, DO  beclomethasone (QVAR) 80 MCG/ACT inhaler Inhale 1 puff into the lungs 2 (two) times daily. 10/17/14   Jaclyn ShaggyEnobong Amao, MD  butalbital-acetaminophen-caffeine (FIORICET) 50-325-40 MG tablet Take 1-2 tablets by mouth every 6 (six) hours as needed for headache. 10/16/14 10/16/15  Kristen N Ward, DO  carvedilol (COREG) 25 MG tablet Take 1 tablet (25 mg total) by mouth 2 (two) times daily with a meal. 12/25/14   Jaclyn ShaggyEnobong Amao, MD  ergocalciferol (VITAMIN D2) 50000 units capsule Take 1 capsule (50,000 Units total) by mouth once a week. 01/11/15   Jaclyn ShaggyEnobong Amao, MD  furosemide (LASIX) 40 MG tablet Take 1 tablet (40 mg total) by mouth 2 (two) times daily. 12/25/14   Jaclyn ShaggyEnobong Amao, MD  guaiFENesin (ROBITUSSIN) 100 MG/5ML SOLN Take 5 mLs (100 mg total) by mouth every 6 (six) hours as needed for cough or to loosen phlegm. 02/14/15   Silas FloodErik Proulx, MD  hydrALAZINE (APRESOLINE) 50 MG tablet Take 1 tablet (50 mg total) by mouth 2 (two) times daily. 12/25/14   Jaclyn ShaggyEnobong Amao, MD  lisinopril (PRINIVIL,ZESTRIL) 20 MG tablet Take 1 tablet (20 mg total) by mouth daily. 12/25/14   Jaclyn ShaggyEnobong Amao, MD  promethazine (PHENERGAN) 25 MG tablet Take 1 tablet (25 mg total) by mouth every 6 (six) hours as needed for nausea or vomiting. 10/16/14  Kristen N Ward, DO  topiramate (TOPAMAX) 50 MG tablet Take 1 tablet (50 mg total) by mouth 2 (two) times daily. 10/17/14   Jaclyn Shaggy, MD   BP 174/113 mmHg  Pulse 110  Temp(Src) 98.3 F (36.8 C) (Oral)  Resp 21  SpO2 98%  LMP 03/10/2015   Physical Exam  Constitutional: She is oriented to person, place, and time. She appears well-developed and well-nourished.  HENT:  Head: Normocephalic and atraumatic.  Nose: Epistaxis (right nare) is observed.  Eyes: EOM are normal. Pupils are equal, round, and reactive to light.  Neck: Normal range of motion. Neck supple. No tracheal deviation  present.  Cardiovascular: Normal rate, regular rhythm and normal heart sounds.   Pulmonary/Chest: Effort normal and breath sounds normal. No respiratory distress. She has no wheezes. She has no rales. She exhibits no tenderness.  Abdominal: Soft. Bowel sounds are normal. There is no tenderness.  Musculoskeletal: Normal range of motion.  Neurological: She is alert and oriented to person, place, and time.  Skin: Skin is warm and dry.  Psychiatric: She has a normal mood and affect. Her behavior is normal. Thought content normal.  Nursing note and vitals reviewed.  ED Course  .Epistaxis Management Date/Time: 04/06/2015 12:37 PM Performed by: Audry Pili Authorized by: Audry Pili Consent: Verbal consent obtained. Consent given by: patient Patient understanding: patient states understanding of the procedure being performed Patient identity confirmed: verbally with patient and arm band Local anesthetic: lidocaine 1% with epinephrine Anesthetic total: 3 ml Treatment site: right anterior Repair method: anterior pack Post-procedure assessment: bleeding stopped Treatment complexity: simple Patient tolerance: Patient tolerated the procedure well with no immediate complications   (including critical care time) Labs Review Labs Reviewed  CBC - Abnormal; Notable for the following:    RBC 3.53 (*)    Hemoglobin 10.3 (*)    HCT 31.8 (*)    All other components within normal limits  I-STAT BETA HCG BLOOD, ED ([facility], [facility], [facility] ONLY)   Imaging Review No results found. I have personally reviewed and evaluated these images and lab results as part of my medical decision-making.   EKG Interpretation None      MDM  I have reviewed and evaluated the relevant imaging studies. I have reviewed the relevant previous healthcare records. I obtained HPI from historian. Patient discussed with supervising physician  ED Course:  Assessment: Pt is a 39yF with hx HTN who presents with nose bleed right nare.  Occurred last night. Bleeding on and off. Not on any blood thinners On exam, pt in NAD. Nontoxic/nonseptic appearing. VSS. Afebrile. Lungs CTA. Heart RRR. Abdomen nontender soft. Blood noted Right nare and posterior orpharynx. Appears anterior. Labs Hgb 10.3. Attempted direct pressure for 10 min. Soaked Lido with epi in cotton balls and placed into nare. Bleeding controlled. Most likely due to HTN. Plan is to DC Home with follow up to PCP. At time of discharge, Patient is in no acute distress. Vital Signs are stable. Patient is able to ambulate. Patient able to tolerate PO.    Disposition/Plan:  DC Home Additional Verbal discharge instructions given and discussed with patient.  Pt Instructed to f/u with PCP in the next week for evaluation and treatment of symptoms. Return precautions given Pt acknowledges and agrees with plan  Supervising Physician Eber Hong, MD   Final diagnoses:  Epistaxis        Audry Pili, PA-C 04/05/15 2017  Audry Pili, PA-C 04/06/15 1239  Eber Hong, MD 04/06/15 1241

## 2015-04-05 NOTE — ED Provider Notes (Signed)
The patient is a 40 year old female who developed a right-sided nosebleed yesterday, it has bled intermittently since that time. The patient does not take blood thinners but does have hypertension and is not taking her lisinopril. On exam the patient does have presence of blood in her posterior pharynx. She also has blood a small amount in the anterior [place], it is difficult to tell whether this is actively bleeding. The patient is quite skittish around any kind of intervention but does agree to have nasal medications.  After initial packing by Mr. Marlise EvesMohr, the bleeding stopped, packing was removed, no further bleeding   Medical screening examination/treatment/procedure(s) were conducted as a shared visit with non-physician practitioner(s) and myself.  I personally evaluated the patient during the encounter.  Clinical Impression:   Final diagnoses:  Epistaxis         Eber HongBrian Maxson Oddo, MD 04/06/15 [id number]

## 2015-04-05 NOTE — ED Notes (Signed)
Pt reports her nose has started to bleed again. Pt instructed to hold bridge of nose. Pt placed gauze in right nare. Joselyn Glassmanyler, PA at bedside and the ENT cart is also at bedside.

## 2015-07-16 ENCOUNTER — Encounter (HOSPITAL_COMMUNITY): Payer: Self-pay | Admitting: Emergency Medicine

## 2015-07-16 ENCOUNTER — Emergency Department (HOSPITAL_COMMUNITY)
Admission: EM | Admit: 2015-07-16 | Discharge: 2015-07-16 | Disposition: A | Discharge status: Home / Self Care | Payer: Self-pay | Attending: Emergency Medicine | Admitting: Emergency Medicine

## 2015-07-16 DIAGNOSIS — F1910 Other psychoactive substance abuse, uncomplicated: Secondary | ICD-10-CM

## 2015-07-16 DIAGNOSIS — J449 Chronic obstructive pulmonary disease, unspecified: Secondary | ICD-10-CM | POA: Insufficient documentation

## 2015-07-16 DIAGNOSIS — F17210 Nicotine dependence, cigarettes, uncomplicated: Secondary | ICD-10-CM | POA: Insufficient documentation

## 2015-07-16 DIAGNOSIS — I130 Hypertensive heart and chronic kidney disease with heart failure and stage 1 through stage 4 chronic kidney disease, or unspecified chronic kidney disease: Secondary | ICD-10-CM | POA: Insufficient documentation

## 2015-07-16 DIAGNOSIS — R51 Headache: Secondary | ICD-10-CM

## 2015-07-16 DIAGNOSIS — I10 Essential (primary) hypertension: Secondary | ICD-10-CM

## 2015-07-16 DIAGNOSIS — I509 Heart failure, unspecified: Secondary | ICD-10-CM | POA: Insufficient documentation

## 2015-07-16 DIAGNOSIS — N189 Chronic kidney disease, unspecified: Secondary | ICD-10-CM | POA: Insufficient documentation

## 2015-07-16 DIAGNOSIS — R519 Headache, unspecified: Secondary | ICD-10-CM

## 2015-07-16 LAB — BASIC METABOLIC PANEL
ANION GAP: 13 (ref 5–15)
BUN: 28 mg/dL — ABNORMAL HIGH (ref 6–20)
CHLORIDE: 101 mmol/L (ref 101–111)
CO2: 21 mmol/L — AB (ref 22–32)
Calcium: 9.1 mg/dL (ref 8.9–10.3)
Creatinine, Ser: 2.85 mg/dL — ABNORMAL HIGH (ref 0.44–1.00)
GFR calc non Af Amer: 20 mL/min — ABNORMAL LOW (ref >60)
GFR, EST AFRICAN AMERICAN: 23 mL/min — AB (ref >60)
Glucose, Bld: 100 mg/dL — ABNORMAL HIGH (ref 65–99)
POTASSIUM: 4.2 mmol/L (ref 3.5–5.1)
Sodium: 135 mmol/L (ref 135–145)

## 2015-07-16 LAB — CBC
HEMATOCRIT: 35.2 % — AB (ref 36.0–46.0)
HEMOGLOBIN: 11.3 g/dL — AB (ref 12.0–15.0)
MCH: 26.7 pg (ref 26.0–34.0)
MCHC: 32.1 g/dL (ref 30.0–36.0)
MCV: 83.2 fL (ref 78.0–100.0)
Platelets: 362 10*3/uL (ref 150–400)
RBC: 4.23 MIL/uL (ref 3.87–5.11)
RDW: 14.8 % (ref 11.5–15.5)
WBC: 10.2 10*3/uL (ref 4.0–10.5)

## 2015-07-16 MED ORDER — SODIUM CHLORIDE 0.9 % IV BOLUS (SEPSIS)
500.0000 mL | Freq: Once | INTRAVENOUS | Status: AC
  Administered 2015-07-16: 500 mL via INTRAVENOUS

## 2015-07-16 MED ORDER — LISINOPRIL 20 MG PO TABS
20.0000 mg | ORAL_TABLET | Freq: Every day | ORAL | Status: DC
  Administered 2015-07-16: 20:00:00 via ORAL
  Filled 2015-07-16: qty 1

## 2015-07-16 MED ORDER — CLONIDINE HCL 0.1 MG PO TABS
0.1000 mg | ORAL_TABLET | Freq: Once | ORAL | Status: AC
  Administered 2015-07-16: 0.1 mg via ORAL
  Filled 2015-07-16: qty 1

## 2015-07-16 MED ORDER — METOCLOPRAMIDE HCL 5 MG/ML IJ SOLN
10.0000 mg | Freq: Once | INTRAMUSCULAR | Status: AC
  Administered 2015-07-16: 10 mg via INTRAVENOUS
  Filled 2015-07-16: qty 2

## 2015-07-16 MED ORDER — DIPHENHYDRAMINE HCL 50 MG/ML IJ SOLN
12.5000 mg | Freq: Once | INTRAMUSCULAR | Status: AC
  Administered 2015-07-16: 20:00:00 via INTRAVENOUS
  Filled 2015-07-16: qty 1

## 2015-07-16 MED ORDER — HYDRALAZINE HCL 25 MG PO TABS
50.0000 mg | ORAL_TABLET | Freq: Two times a day (BID) | ORAL | Status: DC
  Administered 2015-07-16: 50 mg via ORAL
  Filled 2015-07-16: qty 2

## 2015-07-16 MED ORDER — OXYCODONE-ACETAMINOPHEN 5-325 MG PO TABS
1.0000 | ORAL_TABLET | ORAL | Status: AC | PRN
Start: 2015-07-16 — End: 2015-07-16
  Administered 2015-07-16 (×2): 1 via ORAL
  Filled 2015-07-16: qty 1

## 2015-07-16 MED ORDER — OXYCODONE-ACETAMINOPHEN 5-325 MG PO TABS
ORAL_TABLET | ORAL | Status: AC
  Filled 2015-07-16: qty 1

## 2015-07-16 MED ORDER — CARVEDILOL 12.5 MG PO TABS
25.0000 mg | ORAL_TABLET | Freq: Two times a day (BID) | ORAL | Status: DC
  Administered 2015-07-16: 25 mg via ORAL
  Filled 2015-07-16: qty 2

## 2015-07-16 NOTE — Care Management (Signed)
Patient has had 4 ED visits in the past 6 month. [facility]. Patient verbalized understanding teach back done. CM provided patient with clinic contact information no additional questions or concerns verbalized. No further CM needs .

## 2015-07-16 NOTE — ED Provider Notes (Signed)
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   Patient's headache is resolved. She is eating and drinking without difficulty. She can be discharged home per plan of previous treatment team.   Elpidio AnisShari Cannie Muckle, PA-C 07/17/15 0545  Jerelyn ScottMartha Linker, MD 07/18/15 2326

## 2015-07-16 NOTE — Discharge Instructions (Signed)
General Headache Without Cause °A headache is pain or discomfort felt around the head or neck area. The specific cause of a headache may not be found. There are many causes and types of headaches. A few common ones are: °· Tension headaches. °· Migraine headaches. °· Cluster headaches. °· Chronic daily headaches. °HOME CARE INSTRUCTIONS  °Watch your condition for any changes. Take these steps to help with your condition: °Managing Pain °· Take over-the-counter and prescription medicines only as told by your health care provider. °· Lie down in a dark, quiet room when you have a headache. °· If directed, apply ice to the head and neck area: °· Put ice in a plastic bag. °· Place a towel between your skin and the bag. °· Leave the ice on for 20 minutes, 2-3 times per day. °· Use a heating pad or hot shower to apply heat to the head and neck area as told by your health care provider. °· Keep lights dim if bright lights bother you or make your headaches worse. °Eating and Drinking °· Eat meals on a regular schedule. °· Limit alcohol use. °· Decrease the amount of caffeine you drink, or stop drinking caffeine. °General Instructions °· Keep all follow-up visits as told by your health care provider. This is important. °· Keep a headache journal to help find out what may trigger your headaches. For example, write down: °· What you eat and drink. °· How much sleep you get. °· Any change to your diet or medicines. °· Try massage or other relaxation techniques. °· Limit stress. °· Sit up straight, and do not tense your muscles. °· Do not use tobacco products, including cigarettes, chewing tobacco, or e-cigarettes. If you need help quitting, ask your health care provider. °· Exercise regularly as told by your health care provider. °· Sleep on a regular schedule. Get 7-9 hours of sleep, or the amount recommended by your health care provider. °SEEK MEDICAL CARE IF:  °· Your symptoms are not helped by medicine. °· You have a  headache that is different from the usual headache. °· You have nausea or you vomit. °· You have a fever. °SEEK IMMEDIATE MEDICAL CARE IF:  °· Your headache becomes severe. °· You have repeated vomiting. °· You have a stiff neck. °· You have a loss of vision. °· You have problems with speech. °· You have pain in the eye or ear. °· You have muscular weakness or loss of muscle control. °· You lose your balance or have trouble walking. °· You feel faint or pass out. °· You have confusion. °  °This information is not intended to replace advice given to you by your health care provider. Make sure you discuss any questions you have with your health care provider. °  °Document Released: 12/22/2004 Document Revised: 09/12/2014 Document Reviewed: 04/16/2014 °Elsevier Interactive Patient Education ©2016 Elsevier Inc. ° °Hypertension °Hypertension is another name for high blood pressure. High blood pressure forces your heart to work harder to pump blood. A blood pressure reading has two numbers, which includes a higher number over a lower number (example: 110/72). °HOME CARE  °· Have your blood pressure rechecked by your doctor. °· Only take medicine as told by your doctor. Follow the directions carefully. The medicine does not work as well if you skip doses. Skipping doses also puts you at risk for problems. °· Do not smoke. °· Monitor your blood pressure at home as told by your doctor. °GET HELP IF: °· You think you   are having a reaction to the medicine you are taking.  You have repeat headaches or feel dizzy.  You have puffiness (swelling) in your ankles.  You have trouble with your vision. GET HELP RIGHT AWAY IF:   You get a very bad headache and are confused.  You feel weak, numb, or faint.  You get chest or belly (abdominal) pain.  You throw up (vomit).  You cannot breathe very well. MAKE SURE YOU:   Understand these instructions.  Will watch your condition.  Will get help right away if you are  not doing well or get worse.   This information is not intended to replace advice given to you by your health care provider. Make sure you discuss any questions you have with your health care provider.   Document Released: 06/10/2007 Document Revised: 12/27/2012 Document Reviewed: 10/14/2012 Elsevier Interactive Patient Education 2016 [employer].  Polysubstance Abuse When people abuse more than one drug or type of drug it is called polysubstance or polydrug abuse. For example, many smokers also drink alcohol. This is one form of polydrug abuse. Polydrug abuse also refers to the use of a drug to counteract an unpleasant effect produced by another drug. It may also be used to help with withdrawal from another drug. People who take stimulants may become agitated. Sometimes this agitation is countered with a tranquilizer. This helps protect against the unpleasant side effects. Polydrug abuse also refers to the use of different drugs at the same time.  Anytime drug use is interfering with normal living activities, it has become abuse. This includes problems with family and friends. Psychological dependence has developed when your mind tells you that the drug is needed. This is usually followed by physical dependence which has developed when continuing increases of drug are required to get the same feeling or "high". This is known as addiction or chemical dependency. A person's risk is much higher if there is a history of chemical dependency in the family. SIGNS OF CHEMICAL DEPENDENCY  You have been told by friends or family that drugs have become a problem.  You fight when using drugs.  You are having blackouts (not remembering what you do while using).  You feel sick from using drugs but continue using.  You lie about use or amounts of drugs (chemicals) used.  You need chemicals to get you going.  You are suffering in work performance or in school because of drug use.  You get sick from use  of drugs but continue to use anyway.  You need drugs to relate to people or feel comfortable in social situations.  You use drugs to forget problems. "Yes" answered to any of the above signs of chemical dependency indicates there are problems. The longer the use of drugs continues, the greater the problems will become. If there is a family history of drug or alcohol use, it is best not to experiment with these drugs. Continual use leads to tolerance. After tolerance develops more of the drug is needed to get the same feeling. This is followed by addiction. With addiction, drugs become the most important part of life. It becomes more important to take drugs than participate in the other usual activities of life. This includes relating to friends and family. Addiction is followed by dependency. Dependency is a condition where drugs are now needed not just to get high, but to feel normal. Addiction cannot be cured but it can be stopped. This often requires outside help and the care  of professionals. Treatment centers are listed in the yellow pages under: Cocaine, Narcotics, and Alcoholics Anonymous. Most hospitals and clinics can refer you to a specialized care center. Talk to your caregiver if you need help.   This information is not intended to replace advice given to you by your health care provider. Make sure you discuss any questions you have with your health care provider.  [employer] Guide Inpatient Behavioral Health/Residential  Substance Abuse Treatment Adults The United Ways 211 is a great source of information about community services available.  Access by dialing 2-1-1 from anywhere in [place], or by website -  PooledIncome.pl.   (Updated 01/2015)  Crisis Assistance 24 hours a day   Services Offered    Area [employer]  24-hour crisis assistance: [phone] [place], [place]   [facility]  24-hour crisis  assistance:[phone] [place], [place]  [place]   24-hour crisis assistance: [phone] [place], [place]   [facility] Access to Care Line  24-hour crisis assistance; 6694346588 All   [facility]  24-hour crisis response line: 5816773458 All   Other Local Resources (Updated 01/2015)  Inpatient Behavioral Health/Residential Substance Abuse Treatment Programs   Services      Address and Phone Number  [facility] ([facility])   14-day residential rehabilitation  [phone] [address] [place], [place]  [facility]CA ([facility])    Detox - private pay only  14-day residential rehabilitation -  Medicaid, insurance, private pay only [phone], or [phone] [address] [place], [place] [address]   [facility] [employer] only  Multiple facilities [phone] admissions   [facility] ([facility])   90-day program  Must be homeless to participate  ([phone], or [phone] Marcy Panning, [facility]  [facility] only [phone], or  [phone] [address] [place], [place] [address]  [facility]     Must make an appointment  Transportation is offered from [place] on [employer].  Accepts private pay, Sheryn Bison [facility] [phone]  [address] [place], [place] [address]   [employer]  Females only  Associated with the [facility] [phone] [address] [place], [place] [address]  [facility] only [phone], or [phone] [address] [place], [id number]  [facility]    Detox  Residential rehabilitation  Private insurance only  Multiple locations [phone] admissions  [facility]    Private pay  Private insurance [phone] [address] [place], [place] [address]    [facility]    Males only  Fee required at time of admission [phone] [address] [place], [place] [address]  [facility]    Private pay only  [phone] [address] [place], [place]  RTS (Residential Treatment Services)    Detox - private pay, Medicaid  Residential rehabilitation for males  - Medicare, Medicaid, insurance, private pay [phone] [address] [place], [place]   OX[facility]Z    Walk-in interviews Monday - Saturday from 8 am - 4 pm  Individuals with legal charges are not eligible [phone] [address] [place], [place] [address]  [facility]   Must be willing to work  Must attend Alcoholics Anonymous meetings [phone] [address] [place], [place]   [facility] [employer]    Faith-based program  Private pay only [phone] [address] [place], [place]  Community Resource Guide Outpatient Counseling/Substance Abuse Adult The United Ways 211 is a great source of information about community services available.  Access by dialing 2-1-1 from anywhere in [place], or by website -  PooledIncome.plwww.nc211.org.   Other Local Resources (Updated 01/2015)  Crisis Hotlines   Services     Area Served  [employer] [facility]  Crisis Hotline, available 24 hours a day, 7 days a week: [phone] [facility] [place], [place]   [facility]  Crisis Hotline, available 24 hours a day, 7 days a week: [phone] [facility], [place]  [facility]  Suicide Prevention Hotline, available 24 hours a day, 7 days a week: 951-528-1639 [facility], [place]  [employer]   Crisis Hotline, available 24 hours a day, 7 days a week: [phone] [facility] [place], [place]   [facility] Access to [employer]Care Line  Crisis Hotline, available 24 hours a day, 7 days a week: [phone] All   [facility]  Crisis Hotline, available 24 hours a day, 7 days a week: [phone] All   Other Local  Resources (Updated 01/2015)  Outpatient Counseling/ Substance Abuse Programs  Services     Address and Phone Number  ADS (Alcohol and Drug Services)   Options include Individual counseling, group counseling, intensive outpatient program (several hours a day, several days a week)  Offers depression assessments  Provides methadone maintenance program [phone] [address], [address] [place], [place] [address]   [facility]   Offers partial hospitalization/day treatment and DUI/DWI programs  [employer]s Medicare, private insurance [phone] [address], [address] [place], [place] [address]  [facility]    Services include intensive outpatient program (several hours a day, several days a week), outpatient treatment, DUI/DWI services, family education  Also has some services specifically for [employer]  Offers transitional housing  [phone] [address] [place], [place] [address]     [facility]  [employer]s Medicare, private pay, and private insurance [phone] [address], [address] [place], [place] [address]  [employer] [facility] [employer]  Services include individual counseling, substance abuse intensive outpatient program (several hours a day, several days a week), day treatment  Delene Lollccepts Medicare, Medicaid, private insurance [phone] [address], [address] [place], [place] [address]  [facility]   Offers substance abuse intensive outpatient program (several hours a day, several days a week), partial hospitalization program [phone] [address] [place], [place] [address]  [phone] [phone] [place], [place] [address]  [phone] [address] [place], [place] [address]  [phone] [place] [address] [place], [place] [address]  [facility]  Individual counseling only  Accepts private insurance only [phone] [address], [address] [place], [place] [address]  [facility]  Methadone maintenance program [phone] [address] [place], [place] [address]  [facility]  Walk-In Clinic providing substance abuse and mental health counseling  Accepts Medicaid, Medicare, private insurance  Offers sliding scale for uninsured [phone] [address] [place], [place]   Faith in [facility]  Offers individual counseling, and intensive in-home services [phone] [address] [place], [place] [address]  [facility]  Offers individual counseling, family counseling, group therapy, domestic violence counseling, consumer credit counseling  Accepts Medicare, Medicaid, private insurance  Offers sliding scale for uninsured [phone] [address] [place], [place] [address]  [phone] [facility], [address] [place], [place] [address]  [facility]  Offers individual, family  and group counseling  3 locations - [place], [place], and [place]  [phone]  [address] [place], [place] [address]  [address] [place], [place] [address]  [address] [place], [place] [address]  [facility] Margo Aye    Offers psychiatric assessment, 8-week Intensive Outpatient Program (several hours a day, several times a week, daytime or evenings), early recovery group, family Program, medication management  Private pay or private insurance only [phone], or  [phone] [address] [place], [place] [address]  [place] [employer] individual, couples and family counseling  Accepts Medicaid, private insurance, and sliding scale for uninsured [phone] [address] [place], [place] [address]  Len Blalock, MD  Individual counseling  Private insurance [phone] [address] [place], [place] [address]  [facility]   Offers assessment, substance abuse treatment, and  behavioral health treatment [phone] [address] [place], [place] [address]  [facility]  Individual counseling  Accepts private insurance [phone] [address] [place], [place] [address]  [facility]  Individual counseling  Delene Loll, private insurance [phone] [address] [place], [place] [address]  [facility]    Offers intensive outpatient program (several hours a day, several times a week)  Private pay, private insurance [phone] [facility] [place], [place]  [facility]  Individual counseling  Medicare, private insurance [phone] [address] [place], [place] [address]  [facility] Behavioral Health Services    Offers intensive outpatient program (several hours a day, several times a week) and partial hospitalization program [phone] [address] [place], [place] [address]  Emerson Monte, MD  Individual counseling [phone] [address] [place], [place] [address]  [facility]  Offers Christian counseling to individuals, couples, and families  Accepts Medicare and private insurance; offers sliding scale for uninsured [phone] [address] [place], [place] [address]  [facility]  [place] counseling [phone] [address] [place], [place] [address]  [facility] crisis counseling, individual counseling, group therapy, in-home therapy, domestic violence services, day treatment, DWI services, Administrator, arts (CST), Assertive Community Treatment Team (ACTT), substance abuse Intensive Outpatient Program (several hours a day, several times a week)  2 locations - [place] and [place] [phone] [address] [place], [place] [address]  [phone] [address] [place], [place] [address]  [facility]      Individual counseling and group therapy  Accepts private insurance, [place], [place] [phone] [address] [place], [place]  [facility]  Offers individual and family counseling  Offers LGBTQ services  Accepts private insurance and private pay [phone] [address] [place], [place] [address]  [facility]    Offers individual counseling, group therapy, and outpatient detox  Accepts private insurance [phone] [address] [place], [place]  [facility]  Individual counseling  Accepts Medicare, private insurance [phone] [address] [place], [place] [address]  [employer]  Individual counseling  Accepts Medicare, private insurance [phone] [address] [place], [place] [address]  [facility]   Offers substance abuse Intensive Outpatient Program (several hours a day, several times a week) [phone], or [phone] [place], [place]    Take home blood pressure medication as prescribed. Follow up with her primary care doctor as soon as possible for reevaluation. There are multiple outpatient as well as inpatient substance abuse centers listed above. Please  contact these providers for further evaluation. Return to the ED if you experience significant worsening of your symptoms, chest pain, shortness of breath, blurry vision, weakness in extremity.

## 2015-07-16 NOTE — ED Notes (Signed)
Pt states she started having a headache yesterday after she released and used "crack and drank 3 cans of beer". Pt reports history of HTN. Has not taken medications today. Pt hypertensive in triage 246/149.

## 2015-07-16 NOTE — ED Provider Notes (Signed)
CSN: [id number]     Arrival date & time 07/16/15  1500 History   First MD Initiated Contact with Patient 07/16/15 1850     Chief Complaint  Patient presents with  . Hypertension  . Headache     (Consider location/radiation/quality/duration/timing/severity/associated sxs/prior Treatment) HPI   Kishia Shackett is a 40 year old female with a past medical history of hypertension, CK D, CHF presents to the ED today complaining of a headache. 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 Patient states she developed a headache in her bilateral temples and in the back of her head. She states she has headaches very similar to this all the time. She thinks it is related to her blood pressure. She states her blood pressure is always elevated over 200. She has on 4 different blood pressure medications and her kidney doctor just added another one she cannot read the name of this. She is requesting substance abuse treatment referral. She denies any blurry vision, chest pain, difficulty breathing, weakness, paresthesias, blurry vision, neck pain or stiffness. No history of stroke.  Past Medical History  Diagnosis Date  . Hypertension   . Renal disorder   . Arthritis   . Asthma   . COPD (chronic obstructive pulmonary disease) (HCC)   . CHF (congestive heart failure) (HCC)    History reviewed. No pertinent past surgical history. Family History  Problem Relation Age of Onset  . Diabetes Mother   . Hypertension Mother   . Cancer Father   . Cancer Maternal Grandfather   . Cancer Paternal Grandfather   . Diabetes Maternal Grandmother    Social History  Substance Use Topics  . Smoking status: Current Every Day Smoker -- 0.25 packs/day    Types: Cigarettes  . Smokeless tobacco: None  . Alcohol Use: Yes     Comment: occasionally   OB History    No data available     Review of Systems  All other systems  reviewed and are negative.     Allergies  Review of patient's allergies indicates no known allergies.  Home Medications   Prior to Admission medications   Medication Sig Start Date End Date Taking? Authorizing Provider  albuterol (PROVENTIL HFA;VENTOLIN HFA) 108 (90 BASE) MCG/ACT inhaler Inhale 2 puffs into the lungs every 4 (four) hours as needed for wheezing or shortness of breath. 10/16/14  Yes Kristen N Ward, DO  albuterol (PROVENTIL) (2.5 MG/3ML) 0.083% nebulizer solution Take 2.5 mg by nebulization every 6 (six) hours as needed for wheezing or shortness of breath.   Yes Historical Provider, MD  beclomethasone (QVAR) 80 MCG/ACT inhaler Inhale 1 puff into the lungs 2 (two) times daily. 10/17/14  Yes Jaclyn Shaggy, MD  butalbital-acetaminophen-caffeine (FIORICET) 50-325-40 MG tablet Take 1-2 tablets by mouth every 6 (six) hours as needed for headache. 10/16/14 10/16/15 Yes Kristen N Ward, DO  carvedilol (COREG) 25 MG tablet Take 1 tablet (25 mg total) by mouth 2 (two) times daily with a meal. 12/25/14  Yes Jaclyn Shaggy, MD  furosemide (LASIX) 40 MG tablet Take 1 tablet (40 mg total) by mouth 2 (two) times daily. 12/25/14  Yes Jaclyn Shaggy, MD  hydrALAZINE (APRESOLINE) 50 MG tablet Take 1 tablet (50 mg total) by mouth 2 (two) times daily. 12/25/14  Yes Jaclyn Shaggy, MD  labetalol (NORMODYNE) 200 MG tablet Take 200 mg by mouth 2 (two) times daily.   Yes Historical Provider, MD  lisinopril (PRINIVIL,ZESTRIL) 20 MG tablet Take 1 tablet (20 mg total) by mouth daily. 04/05/15  Yes Audry Pili, PA-C  oxymetazoline (AFRIN NASAL SPRAY) 0.05 % nasal spray Place 1 spray into right nostril 2 (two) times daily as needed for congestion. 04/05/15  Yes Audry Pili, PA-C  topiramate (TOPAMAX) 50 MG tablet Take 1 tablet (50 mg total) by mouth 2 (two) times daily. 10/17/14  Yes Jaclyn Shaggy, MD  ergocalciferol (VITAMIN D2) 50000 units capsule Take 1 capsule (50,000 Units total) by mouth once a week. Patient not  taking: Reported on 07/16/2015 01/11/15   Jaclyn Shaggy, MD  guaiFENesin (ROBITUSSIN) 100 MG/5ML SOLN Take 5 mLs (100 mg total) by mouth every 6 (six) hours as needed for cough or to loosen phlegm. Patient not taking: Reported on 07/16/2015 02/14/15   Silas Flood, MD  promethazine (PHENERGAN) 25 MG tablet Take 1 tablet (25 mg total) by mouth every 6 (six) hours as needed for nausea or vomiting. Patient not taking: Reported on 07/16/2015 10/16/14   Kristen N Ward, DO   BP 207/111 mmHg  Pulse 91  Temp(Src) 99 F (37.2 C) (Oral)  Resp 12  Ht  (1.676 m)  Wt 87.091 kg  BMI 31.00 kg/m2  SpO2 99% Physical Exam  Constitutional: She is oriented to person, place, and time. She appears well-developed and well-nourished. No distress.  HENT:  Head: Normocephalic and atraumatic.  Mouth/Throat: No oropharyngeal exudate.  Eyes: Conjunctivae and EOM are normal. Pupils are equal, round, and reactive to light. Right eye exhibits no discharge. Left eye exhibits no discharge. No scleral icterus.  Cardiovascular: Normal rate, regular rhythm, normal heart sounds and intact distal pulses.  Exam reveals no gallop and no friction rub.   No murmur heard. Pulmonary/Chest: Effort normal and breath sounds normal. No respiratory distress. She has no wheezes. She has no rales. She exhibits no tenderness.  Abdominal: Soft. She exhibits no distension. There is no tenderness. There is no guarding.  Musculoskeletal: Normal range of motion. She exhibits no edema.  Neurological: She is alert and oriented to person, place, and time. No cranial nerve deficit. She exhibits normal muscle tone. Coordination normal.  Strength 5/5 throughout. No sensory deficits.  No facial droop. No pronator drift. No gait abnormality. No slurred speech. No dysmetria.  Skin: Skin is warm and dry. No rash noted. She is not diaphoretic. No erythema. No pallor.  Psychiatric: She has a normal mood and affect. Her behavior is normal.  Nursing note and  vitals reviewed.   ED Course  Procedures (including critical care time) Labs Review Labs Reviewed  BASIC METABOLIC PANEL - Abnormal; Notable for the following:    CO2 21 (*)    Glucose, Bld 100 (*)    BUN 28 (*)    Creatinine, Ser 2.85 (*)    GFR calc non Af Amer 20 (*)    GFR calc Af Amer 23 (*)    All other components within normal limits  CBC - Abnormal; Notable for the following:    Hemoglobin 11.3 (*)    HCT 35.2 (*)    All other components within normal limits    Imaging Review No results found. I have personally reviewed and evaluated these images and lab results as part of my medical decision-making.   EKG Interpretation   Date/Time:  Tuesday July 16 2015 19:39:54 EDT Ventricular Rate:  93 PR Interval:    QRS Duration: 106 QT Interval:  405 QTC Calculation: 504 R Axis:   71  Text Interpretation:  Sinus rhythm Biatrial enlargement Left ventricular  hypertrophy Borderline prolonged QT interval Since previous tracing qt has  lengthened Confirmed by Karma GanjaLINKER  MD, MARTHA [phone]([id number]) on 07/16/2015 8:24:34 PM      MDM   Final diagnoses:  Essential hypertension  Nonintractable headache, unspecified chronicity pattern, unspecified headache type  Substance abuse   40 year old female with history of hypertension, CK D, CHF presents to the ED today planning of a headache. Patient relapsed on crack yesterday. She did not take her blood pressure medications today or yesterday. She is hypertensive on arrival to the ED with systolic of 215. She states her blood pressure is chronically this elevated despite her 4 different blood pressure medications. No strokelike symptoms. No neurological deficits noted on exam. Patient states her current headache is similar to typical headaches. Will give patient's home blood pressure medications in the ED as well as an additional dose of clonidine. We'll administer migraine cocktail and reevaluate. We'll refer patient to inpatient substance abuse  treatment programs.  Patient signed out to Elpidio AnisShari Upstill PA-C at shift change pending medication administration and re-evaluation. Pts BP may not improve as this is chronic for pt. As long as pt symptomatically improves feel that she is safe for discharge with PCP follow up. Substance abuse referral given.     Lester KinsmanSamantha Tripp JenkinsburgDowless, PA-C 07/16/15 2038  Jerelyn ScottMartha Linker, MD 07/16/15 2049

## 2015-07-27 ENCOUNTER — Other Ambulatory Visit: Payer: Self-pay | Admitting: Nephrology

## 2015-07-27 DIAGNOSIS — N189 Chronic kidney disease, unspecified: Secondary | ICD-10-CM

## 2015-08-07 ENCOUNTER — Ambulatory Visit
Admission: RE | Admit: 2015-08-07 | Discharge: 2015-08-07 | Disposition: A | Discharge status: Home / Self Care | Payer: Self-pay | Source: Ambulatory Visit | Attending: Nephrology | Admitting: Nephrology

## 2015-08-07 DIAGNOSIS — N189 Chronic kidney disease, unspecified: Secondary | ICD-10-CM

## 2015-08-29 ENCOUNTER — Encounter (HOSPITAL_COMMUNITY): Payer: Self-pay | Admitting: Emergency Medicine

## 2015-08-29 ENCOUNTER — Observation Stay (HOSPITAL_COMMUNITY): Payer: Self-pay

## 2015-08-29 ENCOUNTER — Inpatient Hospital Stay (HOSPITAL_COMMUNITY)
Admission: EM | Admit: 2015-08-29 | Discharge: 2015-09-04 | DRG: 580 | Discharge status: Against Medical Advice | Payer: Self-pay | Attending: Family Medicine | Admitting: Family Medicine

## 2015-08-29 ENCOUNTER — Ambulatory Visit (HOSPITAL_BASED_OUTPATIENT_CLINIC_OR_DEPARTMENT_OTHER)
Admit: 2015-08-29 | Discharge: 2015-08-29 | Disposition: A | Discharge status: Home / Self Care | Payer: PRIVATE HEALTH INSURANCE | Attending: Emergency Medicine | Admitting: Emergency Medicine

## 2015-08-29 DIAGNOSIS — J449 Chronic obstructive pulmonary disease, unspecified: Secondary | ICD-10-CM | POA: Diagnosis present

## 2015-08-29 DIAGNOSIS — L03115 Cellulitis of right lower limb: Principal | ICD-10-CM

## 2015-08-29 DIAGNOSIS — Z79899 Other long term (current) drug therapy: Secondary | ICD-10-CM

## 2015-08-29 DIAGNOSIS — N184 Chronic kidney disease, stage 4 (severe): Secondary | ICD-10-CM | POA: Diagnosis present

## 2015-08-29 DIAGNOSIS — M7989 Other specified soft tissue disorders: Secondary | ICD-10-CM | POA: Diagnosis not present

## 2015-08-29 DIAGNOSIS — F17210 Nicotine dependence, cigarettes, uncomplicated: Secondary | ICD-10-CM | POA: Diagnosis present

## 2015-08-29 DIAGNOSIS — T63301A Toxic effect of unspecified spider venom, accidental (unintentional), initial encounter: Secondary | ICD-10-CM | POA: Diagnosis present

## 2015-08-29 DIAGNOSIS — R791 Abnormal coagulation profile: Secondary | ICD-10-CM | POA: Diagnosis present

## 2015-08-29 DIAGNOSIS — I130 Hypertensive heart and chronic kidney disease with heart failure and stage 1 through stage 4 chronic kidney disease, or unspecified chronic kidney disease: Secondary | ICD-10-CM | POA: Diagnosis present

## 2015-08-29 DIAGNOSIS — F1490 Cocaine use, unspecified, uncomplicated: Secondary | ICD-10-CM | POA: Diagnosis present

## 2015-08-29 DIAGNOSIS — G4733 Obstructive sleep apnea (adult) (pediatric): Secondary | ICD-10-CM | POA: Diagnosis present

## 2015-08-29 DIAGNOSIS — M79609 Pain in unspecified limb: Secondary | ICD-10-CM

## 2015-08-29 DIAGNOSIS — Z7951 Long term (current) use of inhaled steroids: Secondary | ICD-10-CM

## 2015-08-29 DIAGNOSIS — D638 Anemia in other chronic diseases classified elsewhere: Secondary | ICD-10-CM | POA: Diagnosis present

## 2015-08-29 DIAGNOSIS — I5032 Chronic diastolic (congestive) heart failure: Secondary | ICD-10-CM | POA: Diagnosis present

## 2015-08-29 DIAGNOSIS — R209 Unspecified disturbances of skin sensation: Secondary | ICD-10-CM | POA: Diagnosis present

## 2015-08-29 DIAGNOSIS — F329 Major depressive disorder, single episode, unspecified: Secondary | ICD-10-CM | POA: Diagnosis present

## 2015-08-29 DIAGNOSIS — D509 Iron deficiency anemia, unspecified: Secondary | ICD-10-CM | POA: Diagnosis present

## 2015-08-29 HISTORY — DX: Cellulitis of right lower limb: L03.115

## 2015-08-29 HISTORY — DX: Depression, unspecified: F32.A

## 2015-08-29 HISTORY — DX: Obstructive sleep apnea (adult) (pediatric): G47.33

## 2015-08-29 HISTORY — DX: Dependence on supplemental oxygen: Z99.81

## 2015-08-29 HISTORY — DX: Major depressive disorder, single episode, unspecified: F32.9

## 2015-08-29 HISTORY — DX: Anemia, unspecified: D64.9

## 2015-08-29 LAB — D-DIMER, QUANTITATIVE (NOT AT ARMC): D DIMER QUANT: 1.24 ug{FEU}/mL — AB (ref 0.00–0.50)

## 2015-08-29 LAB — RETICULOCYTES
RBC.: 2.89 MIL/uL — ABNORMAL LOW (ref 3.87–5.11)
Retic Count, Absolute: 83.8 10*3/uL (ref 19.0–186.0)
Retic Ct Pct: 2.9 % (ref 0.4–3.1)

## 2015-08-29 LAB — CBC WITH DIFFERENTIAL/PLATELET
Basophils Absolute: 0.1 10*3/uL (ref 0.0–0.1)
Basophils Relative: 1 %
EOS ABS: 0.2 10*3/uL (ref 0.0–0.7)
Eosinophils Relative: 2 %
HCT: 29.2 % — ABNORMAL LOW (ref 36.0–46.0)
HEMOGLOBIN: 8.7 g/dL — AB (ref 12.0–15.0)
LYMPHS ABS: 1.8 10*3/uL (ref 0.7–4.0)
Lymphocytes Relative: 14 %
MCH: 25.9 pg — AB (ref 26.0–34.0)
MCHC: 29.8 g/dL — AB (ref 30.0–36.0)
MCV: 86.9 fL (ref 78.0–100.0)
MONOS PCT: 7 %
Monocytes Absolute: 0.9 10*3/uL (ref 0.1–1.0)
NEUTROS PCT: 76 %
Neutro Abs: 10.2 10*3/uL — ABNORMAL HIGH (ref 1.7–7.7)
Platelets: 380 10*3/uL (ref 150–400)
RBC: 3.36 MIL/uL — ABNORMAL LOW (ref 3.87–5.11)
RDW: 16 % — ABNORMAL HIGH (ref 11.5–15.5)
WBC: 13.2 10*3/uL — ABNORMAL HIGH (ref 4.0–10.5)

## 2015-08-29 LAB — BASIC METABOLIC PANEL
Anion gap: 9 (ref 5–15)
BUN: 21 mg/dL — AB (ref 6–20)
CHLORIDE: 108 mmol/L (ref 101–111)
CO2: 22 mmol/L (ref 22–32)
CREATININE: 2.16 mg/dL — AB (ref 0.44–1.00)
Calcium: 8.7 mg/dL — ABNORMAL LOW (ref 8.9–10.3)
GFR calc non Af Amer: 27 mL/min — ABNORMAL LOW (ref >60)
GFR, EST AFRICAN AMERICAN: 32 mL/min — AB (ref >60)
Glucose, Bld: 93 mg/dL (ref 65–99)
Potassium: 3.7 mmol/L (ref 3.5–5.1)
Sodium: 139 mmol/L (ref 135–145)

## 2015-08-29 LAB — IRON AND TIBC
IRON: 18 ug/dL — AB (ref 28–170)
SATURATION RATIOS: 6 % — AB (ref 10.4–31.8)
TIBC: 284 ug/dL (ref 250–450)
UIBC: 266 ug/dL

## 2015-08-29 LAB — VITAMIN B12: Vitamin B-12: 613 pg/mL (ref 180–914)

## 2015-08-29 LAB — FERRITIN: FERRITIN: 27 ng/mL (ref 11–307)

## 2015-08-29 LAB — FOLATE: FOLATE: 8.6 ng/mL (ref >5.9)

## 2015-08-29 MED ORDER — CLINDAMYCIN PHOSPHATE 600 MG/50ML IV SOLN
600.0000 mg | Freq: Once | INTRAVENOUS | Status: AC
  Administered 2015-08-29: 600 mg via INTRAVENOUS
  Filled 2015-08-29: qty 50

## 2015-08-29 MED ORDER — HYDROCODONE-ACETAMINOPHEN 5-325 MG PO TABS
1.0000 | ORAL_TABLET | ORAL | Status: DC | PRN
  Administered 2015-08-29 – 2015-09-03 (×18): 2 via ORAL
  Filled 2015-08-29 (×18): qty 2

## 2015-08-29 MED ORDER — LISINOPRIL 20 MG PO TABS: 20.0000 mg | ORAL_TABLET | Freq: Every day | ORAL | Status: DC

## 2015-08-29 MED ORDER — HYDROMORPHONE HCL 1 MG/ML IJ SOLN
1.0000 mg | Freq: Once | INTRAMUSCULAR | Status: AC
  Administered 2015-08-29: 1 mg via INTRAVENOUS
  Filled 2015-08-29: qty 1

## 2015-08-29 MED ORDER — CARVEDILOL 25 MG PO TABS
25.0000 mg | ORAL_TABLET | Freq: Two times a day (BID) | ORAL | Status: DC
Start: 2015-08-29 — End: 2015-08-29
  Administered 2015-08-29: 25 mg via ORAL
  Filled 2015-08-29: qty 2

## 2015-08-29 MED ORDER — CARVEDILOL 25 MG PO TABS
25.0000 mg | ORAL_TABLET | Freq: Two times a day (BID) | ORAL | Status: DC
  Administered 2015-08-30 – 2015-09-04 (×11): 25 mg via ORAL
  Filled 2015-08-29 (×11): qty 1

## 2015-08-29 MED ORDER — TOPIRAMATE 25 MG PO TABS
50.0000 mg | ORAL_TABLET | Freq: Two times a day (BID) | ORAL | Status: DC
  Administered 2015-08-29 – 2015-09-04 (×12): 50 mg via ORAL
  Filled 2015-08-29 (×12): qty 2

## 2015-08-29 MED ORDER — HYDRALAZINE HCL 25 MG PO TABS
50.0000 mg | ORAL_TABLET | Freq: Once | ORAL | Status: AC
  Administered 2015-08-29: 50 mg via ORAL
  Filled 2015-08-29: qty 2

## 2015-08-29 MED ORDER — ALBUTEROL SULFATE (2.5 MG/3ML) 0.083% IN NEBU: 2.5000 mg | INHALATION_SOLUTION | Freq: Four times a day (QID) | RESPIRATORY_TRACT | Status: DC | PRN

## 2015-08-29 MED ORDER — FERROUS SULFATE 325 (65 FE) MG PO TABS: 325.0000 mg | ORAL_TABLET | Freq: Every day | ORAL | 0 refills | Status: AC

## 2015-08-29 MED ORDER — LISINOPRIL 20 MG PO TABS
20.0000 mg | ORAL_TABLET | Freq: Once | ORAL | Status: AC
  Administered 2015-08-29: 20 mg via ORAL
  Filled 2015-08-29: qty 1

## 2015-08-29 MED ORDER — LABETALOL HCL 200 MG PO TABS: 200.0000 mg | ORAL_TABLET | Freq: Two times a day (BID) | ORAL | Status: DC

## 2015-08-29 MED ORDER — CLINDAMYCIN HCL 150 MG PO CAPS: 300.0000 mg | ORAL_CAPSULE | Freq: Four times a day (QID) | ORAL | 0 refills | Status: DC

## 2015-08-29 MED ORDER — DOCUSATE SODIUM 100 MG PO CAPS
100.0000 mg | ORAL_CAPSULE | Freq: Two times a day (BID) | ORAL | Status: DC
Start: 2015-08-29 — End: 2015-08-30
  Administered 2015-08-29: 100 mg via ORAL
  Filled 2015-08-29 (×2): qty 1

## 2015-08-29 MED ORDER — FUROSEMIDE 40 MG PO TABS
40.0000 mg | ORAL_TABLET | Freq: Two times a day (BID) | ORAL | Status: DC
  Administered 2015-08-30 – 2015-09-04 (×11): 40 mg via ORAL
  Filled 2015-08-29 (×11): qty 1

## 2015-08-29 MED ORDER — ENOXAPARIN SODIUM 40 MG/0.4ML ~~LOC~~ SOLN
40.0000 mg | SUBCUTANEOUS | Status: DC
  Administered 2015-09-01: 40 mg via SUBCUTANEOUS
  Filled 2015-08-29: qty 0.4

## 2015-08-29 MED ORDER — ONDANSETRON HCL 4 MG/2ML IJ SOLN
4.0000 mg | Freq: Once | INTRAMUSCULAR | Status: AC
  Administered 2015-08-29: 4 mg via INTRAVENOUS
  Filled 2015-08-29: qty 2

## 2015-08-29 MED ORDER — HYDRALAZINE HCL 20 MG/ML IJ SOLN
20.0000 mg | Freq: Once | INTRAMUSCULAR | Status: AC
  Administered 2015-08-29: 20 mg via INTRAVENOUS
  Filled 2015-08-29: qty 1

## 2015-08-29 MED ORDER — HYDROXYZINE HCL 25 MG PO TABS
25.0000 mg | ORAL_TABLET | Freq: Once | ORAL | Status: AC
  Administered 2015-08-30: 25 mg via ORAL
  Filled 2015-08-29: qty 1

## 2015-08-29 MED ORDER — HYDROCODONE-ACETAMINOPHEN 5-325 MG PO TABS: 1.0000 | ORAL_TABLET | ORAL | 0 refills | Status: DC | PRN

## 2015-08-29 MED ORDER — LABETALOL HCL 200 MG PO TABS
200.0000 mg | ORAL_TABLET | Freq: Once | ORAL | Status: AC
  Administered 2015-08-29: 200 mg via ORAL
  Filled 2015-08-29: qty 1

## 2015-08-29 MED ORDER — VANCOMYCIN HCL IN DEXTROSE 1-5 GM/200ML-% IV SOLN
1000.0000 mg | Freq: Once | INTRAVENOUS | Status: AC
  Administered 2015-08-29: 1000 mg via INTRAVENOUS
  Filled 2015-08-29: qty 200

## 2015-08-29 MED ORDER — SODIUM CHLORIDE 0.9 % IV BOLUS (SEPSIS)
1000.0000 mL | Freq: Once | INTRAVENOUS | Status: AC
  Administered 2015-08-29: 1000 mL via INTRAVENOUS

## 2015-08-29 MED ORDER — VANCOMYCIN HCL IN DEXTROSE 1-5 GM/200ML-% IV SOLN
1000.0000 mg | INTRAVENOUS | Status: DC
  Administered 2015-08-30 – 2015-09-04 (×6): 1000 mg via INTRAVENOUS
  Filled 2015-08-29 (×7): qty 200

## 2015-08-29 MED ORDER — BUDESONIDE 0.25 MG/2ML IN SUSP
2.0000 mL | Freq: Two times a day (BID) | RESPIRATORY_TRACT | Status: DC
  Administered 2015-08-29 – 2015-08-31 (×4): 0.25 mg via RESPIRATORY_TRACT
  Filled 2015-08-29 (×13): qty 2

## 2015-08-29 MED ORDER — LISINOPRIL 20 MG PO TABS
20.0000 mg | ORAL_TABLET | Freq: Every day | ORAL | Status: DC
  Administered 2015-08-30 – 2015-09-04 (×6): 20 mg via ORAL
  Filled 2015-08-29 (×6): qty 1

## 2015-08-29 MED ORDER — HYDRALAZINE HCL 50 MG PO TABS
50.0000 mg | ORAL_TABLET | Freq: Two times a day (BID) | ORAL | Status: DC
  Administered 2015-08-30 – 2015-09-04 (×10): 50 mg via ORAL
  Filled 2015-08-29 (×12): qty 1

## 2015-08-29 NOTE — ED Triage Notes (Signed)
Patient home from [place], was bit by a spider on right leg.  She was seen in an ED there, no drainage done there, was given antibiotics but it is getting worse at this time.  Patient complaining of increased pain, swelling increased to area.  Foot is swollen also.

## 2015-08-29 NOTE — Progress Notes (Signed)
Pharmacy Antibiotic Note  April CarrowLakricia Edwards-Jones is a 40 y.o. female admitted on 08/29/2015 with R leg cellulitis 2/2 to suspected spider bite per patient.  Pharmacy has been consulted for vancomycin dosing.   Pt has been taking keflex PTA x 2 days. Was started on clindamycin here however redness worsened so pt was switched vancomycin. WBC 13.2, AF, SCr 2.16, CrCl = ~35  Plan: Vancomycin 1000 IV every 24 hours.  Goal trough 10-15 mcg/mL.  Monitor clinical picture, Tmax, renal function, vanc trough as needed F/u LOT  Height: 5\' 4"  (162.6 cm) Weight: 185 lb (83.9 kg) IBW/kg (Calculated) : 54.7  Temp (24hrs), Avg:98.1 F (36.7 C), Min:97.9 F (36.6 C), Max:98.3 F (36.8 C)   Recent Labs Lab 08/29/15 0435  WBC 13.2*  CREATININE 2.16*    Estimated Creatinine Clearance: 36.3 mL/min (by C-G formula based on SCr of 2.16 mg/dL).    No Known Allergies  Antimicrobials this admission: Clindamycin 8/24 x 1  Dose adjustments this admission: none  Microbiology results: No cultures sent  Thank you for allowing pharmacy to be a part of this patient's care.  Allena Katzaroline E Deveon Kisiel, Pharm.D. PGY1 Pharmacy Resident 8/24/201711:47 AM Pager [phone]

## 2015-08-29 NOTE — Progress Notes (Signed)
Pt arrived in 5w11. Belongings in room. Call light in reach

## 2015-08-29 NOTE — ED Notes (Signed)
EDP at bedside, pt refused rectal exam to obtain hemoccult.

## 2015-08-29 NOTE — ED Notes (Signed)
Pt states she was bitten by a spider two days ago, went to an ED in [place], was given Keflex and has been taking for 2 days. Pt has been traveling in car from [place] for the last 17hrs.

## 2015-08-29 NOTE — Progress Notes (Signed)
Preliminary results by tech - Right Lower Ext. Venous Duplex Completed. No evidence of deep or superficial vein thrombosis in the right leg.  Staria Birkhead, BS, RDMS, RVT  

## 2015-08-29 NOTE — ED Notes (Signed)
Pt returned to room from Vascular study.

## 2015-08-29 NOTE — ED Provider Notes (Addendum)
[facility]-EMERGENCY DEPT Provider Note   CSN: [id number] Arrival date & time: 08/29/15  [id number]     History   Chief Complaint Chief Complaint  Patient presents with  . Leg Pain  . Abscess    HPI April [name] is a 40 y.o. female.  Patient presents to the emergency department for evaluation of painful swollen area on her leg. Symptoms started 2 days ago. Patient was in [place] visiting when this began. She was seen in an ER there and was started on Keflex. Patient reports that the pain has worsened.      Past Medical History:  Diagnosis Date  . Arthritis   . Asthma   . CHF (congestive heart failure) (HCC)   . COPD (chronic obstructive pulmonary disease) (HCC)   . Hypertension   . Renal disorder     Patient Active Problem List   Diagnosis Date Noted  . Vitamin D deficiency 01/11/2015  . Fatigue 01/10/2015  . Dyspnea 01/10/2015  . Migraine 10/17/2014  . Chronic kidney disease 10/17/2014  . Asthma 09/04/2014  . Hypertension 09/03/2014  . Congestive heart failure (HCC) 09/03/2014    History reviewed. No pertinent surgical history.  OB History    No data available       Home Medications    Prior to Admission medications   Medication Sig Start Date End Date Taking? Authorizing Provider  albuterol (PROVENTIL HFA;VENTOLIN HFA) 108 (90 BASE) MCG/ACT inhaler Inhale 2 puffs into the lungs every 4 (four) hours as needed for wheezing or shortness of breath. 10/16/14  Yes Kristen N Ward, DO  albuterol (PROVENTIL) (2.5 MG/3ML) 0.083% nebulizer solution Take 2.5 mg by nebulization every 6 (six) hours as needed for wheezing or shortness of breath.   Yes Historical Provider, MD  beclomethasone (QVAR) 80 MCG/ACT inhaler Inhale 1 puff into the lungs 2 (two) times daily. 10/17/14  Yes Jaclyn ShaggyEnobong Amao, MD  butalbital-acetaminophen-caffeine (FIORICET) 50-325-40 MG tablet Take 1-2 tablets by mouth every 6 (six) hours as needed for headache. 10/16/14 10/16/15 Yes Kristen N Ward, DO    carvedilol (COREG) 25 MG tablet Take 1 tablet (25 mg total) by mouth 2 (two) times daily with a meal. 12/25/14  Yes Jaclyn ShaggyEnobong Amao, MD  furosemide (LASIX) 40 MG tablet Take 1 tablet (40 mg total) by mouth 2 (two) times daily. 12/25/14  Yes Jaclyn ShaggyEnobong Amao, MD  hydrALAZINE (APRESOLINE) 50 MG tablet Take 1 tablet (50 mg total) by mouth 2 (two) times daily. 12/25/14  Yes Jaclyn ShaggyEnobong Amao, MD  labetalol (NORMODYNE) 200 MG tablet Take 200 mg by mouth 2 (two) times daily.   Yes Historical Provider, MD  lisinopril (PRINIVIL,ZESTRIL) 20 MG tablet Take 1 tablet (20 mg total) by mouth daily. 04/05/15  Yes Audry Piliyler Mohr, PA-C  oxymetazoline (AFRIN NASAL SPRAY) 0.05 % nasal spray Place 1 spray into right nostril 2 (two) times daily as needed for congestion. 04/05/15  Yes Audry Piliyler Mohr, PA-C  topiramate (TOPAMAX) 50 MG tablet Take 1 tablet (50 mg total) by mouth 2 (two) times daily. 10/17/14  Yes Jaclyn ShaggyEnobong Amao, MD  clindamycin (CLEOCIN) 150 MG capsule Take 2 capsules (300 mg total) by mouth 4 (four) times daily. 08/29/15   Gilda Creasehristopher J Pollina, MD  ferrous sulfate 325 (65 FE) MG tablet Take 1 tablet (325 mg total) by mouth daily. 08/29/15   Gilda Creasehristopher J Pollina, MD  HYDROcodone-acetaminophen (NORCO/VICODIN) 5-325 MG tablet Take 1-2 tablets by mouth every 4 (four) hours as needed for moderate pain. 08/29/15   Gilda Creasehristopher J Pollina, MD  Family History Family History  Problem Relation Age of Onset  . Diabetes Mother   . Hypertension Mother   . Cancer Father   . Diabetes Maternal Grandmother   . Cancer Maternal Grandfather   . Cancer Paternal Grandfather     Social History Social History  Substance Use Topics  . Smoking status: Current Every Day Smoker    Packs/day: 0.25    Types: Cigarettes  . Smokeless tobacco: Never Used  . Alcohol use Yes     Comment: occasionally     Allergies   Review of patient's allergies indicates no known allergies.   Review of Systems Review of Systems  Skin: Positive for color  change.  All other systems reviewed and are negative.    Physical Exam Updated Vital Signs BP 135/72   Pulse 99   Temp 97.9 F (36.6 C) (Oral)   Resp 11   Ht 5\' 4"  (1.626 m)   Wt 185 lb (83.9 kg)   LMP 08/10/2015 (Exact Date)   SpO2 96%   BMI 31.76 kg/m   Physical Exam  Constitutional: She is oriented to person, place, and time. She appears well-developed and well-nourished. No distress.  HENT:  Head: Normocephalic and atraumatic.  Right Ear: Hearing normal.  Left Ear: Hearing normal.  Nose: Nose normal.  Mouth/Throat: Oropharynx is clear and moist and mucous membranes are normal.  Eyes: Conjunctivae and EOM are normal. Pupils are equal, round, and reactive to light.  Neck: Normal range of motion. Neck supple.  Cardiovascular: Regular rhythm, S1 normal and S2 normal.  Exam reveals no gallop and no friction rub.   No murmur heard. Pulmonary/Chest: Effort normal and breath sounds normal. No respiratory distress. She exhibits no tenderness.  Abdominal: Soft. Normal appearance and bowel sounds are normal. There is no hepatosplenomegaly. There is no tenderness. There is no rebound, no guarding, no tenderness at McBurney's point and negative Murphy's sign. No hernia.  Musculoskeletal: Normal range of motion.  Swelling, erythema, warmth right lower leg and foot  Neurological: She is alert and oriented to person, place, and time. She has normal strength. No cranial nerve deficit or sensory deficit. Coordination normal. GCS eye subscore is 4. GCS verbal subscore is 5. GCS motor subscore is 6.  Skin: Skin is warm, dry and intact. No rash noted. No cyanosis.  Psychiatric: She has a normal mood and affect. Her speech is normal and behavior is normal. Thought content normal.  Nursing note and vitals reviewed.    ED Treatments / Results  Labs (all labs ordered are listed, but only abnormal results are displayed) Labs Reviewed  BASIC METABOLIC PANEL - Abnormal; Notable for the  following:       Result Value   BUN 21 (*)    Creatinine, Ser 2.16 (*)    Calcium 8.7 (*)    GFR calc non Af Amer 27 (*)    GFR calc Af Amer 32 (*)    All other components within normal limits  CBC WITH DIFFERENTIAL/PLATELET - Abnormal; Notable for the following:    WBC 13.2 (*)    RBC 3.36 (*)    Hemoglobin 8.7 (*)    HCT 29.2 (*)    MCH 25.9 (*)    MCHC 29.8 (*)    RDW 16.0 (*)    Neutro Abs 10.2 (*)    All other components within normal limits  D-DIMER, QUANTITATIVE (NOT AT [facility]) - Abnormal; Notable for the following:    D-Dimer, Quant 1.24 (*)  All other components within normal limits    EKG  EKG Interpretation  Date/Time:  Thursday August 29 2015 04:39:05 EDT Ventricular Rate:  106 PR Interval:    QRS Duration: 82 QT Interval:  356 QTC Calculation: 473 R Axis:   82 Text Interpretation:  Sinus tachycardia Biatrial enlargement Probable left ventricular hypertrophy No significant change since last tracing Confirmed by POLLINA  MD, CHRISTOPHER [phone]) on 08/29/2015 4:48:53 AM       Radiology No results found.  Procedures Procedures (including critical care time)  Medications Ordered in ED Medications  carvedilol (COREG) tablet 25 mg (25 mg Oral Given 08/29/15 0558)  HYDROmorphone (DILAUDID) injection 1 mg (1 mg Intravenous Given 08/29/15 0447)  ondansetron (ZOFRAN) injection 4 mg (4 mg Intravenous Given 08/29/15 0441)  hydrALAZINE (APRESOLINE) injection 20 mg (20 mg Intravenous Given 08/29/15 0439)  clindamycin (CLEOCIN) IVPB 600 mg (0 mg Intravenous Stopped 08/29/15 0521)  lisinopril (PRINIVIL,ZESTRIL) tablet 20 mg (20 mg Oral Given 08/29/15 0558)  hydrALAZINE (APRESOLINE) tablet 50 mg (50 mg Oral Given 08/29/15 0558)  labetalol (NORMODYNE) tablet 200 mg (200 mg Oral Given 08/29/15 0558)     Initial Impression / Assessment and Plan / ED Course  I have reviewed the triage vital signs and the nursing notes.  Pertinent labs & imaging results that were available  during my care of the patient were reviewed by me and considered in my medical decision making (see chart for details).  Clinical Course   Patient presents to the emergency department for evaluation of painful lesion on her leg. She does have an area of erythema and tenderness without fluctuance on the outside aspect of the right leg. This appears consistent with a very early abscess but there is nothing to drain currently. Patient treated with clindamycin and will continue clindamycin as an outpatient.  Patient was found to be extremely hypertensive on arrival. She does have a history of essential hypertension on multiple agents. She was given analgesia and IV hydralazine, blood pressure has significantly improved.  Based on the fact that the patient has been traveling extensively, just finished a 17 hour car ride, DVT is considered. She does have swelling of the right leg compared to left, but this is likely all secondary to infection. As her d-dimer is elevated, she will require venous duplex to further evaluate. Will sign out to oncoming ER physician to follow-up on duplex. Patient will be appropriate for discharge regardless, will prescribe clindamycin and analgesia.  Patient is noted to be anemic here in the ER. This does not appear to be symptomatic. This is likely consistent with chronic disease as the patient has chronic renal disease. She also, however, has a history of heavy menstrual periods. She reports she has a history of anemia and has declined rectal exam.  Final Clinical Impressions(s) / ED Diagnoses   Final diagnoses:  Cellulitis of right lower extremity  Iron deficiency anemia    New Prescriptions New Prescriptions   CLINDAMYCIN (CLEOCIN) 150 MG CAPSULE    Take 2 capsules (300 mg total) by mouth 4 (four) times daily.   FERROUS SULFATE 325 (65 FE) MG TABLET    Take 1 tablet (325 mg total) by mouth daily.   HYDROCODONE-ACETAMINOPHEN (NORCO/VICODIN) 5-325 MG TABLET    Take 1-2  tablets by mouth every 4 (four) hours as needed for moderate pain.     Gilda Crease, MD 08/29/15 6045    Gilda Crease, MD 11/21/15 [phone]

## 2015-08-29 NOTE — H&P (Signed)
[facility] History and Physical Service Pager: [phone]  [name] Medical record number: [id number] Date of birth: April 22, 1975 Age: 40 y.o. Gender: female  Primary Care Provider: Jaclyn ShaggyEnobong, Amao, MD Consultants: None Code Status: Full  Chief Complaint: RLE pain  Assessment and Plan: April [name] is a 40 y.o. female presenting with RLE pain, swelling, and redness. PMH is significant for COPD, CHF, HTN, and CKD.  #Cellulitis to RLE. Suspect due to spider bite. In setting of rapidly worsening pain and erythema in ED patient switched to IV antibiotics. WBC elevated at 13.2. Afebrile. Vitals are stable. No concern at this time for compartment syndrome or gas-forming infection.  -admit to observation -continue IV vancomycin for 24hrs then switch to PO -monitor cellculitis borders; outlined on admission -Norco prn for pain -vitals per floor protocol -consider CT leg if worsening  #Elevated D-dimer. Collected in setting of increased leg pain. Followed up with LE dopplers which were normal.  #Anemia. Per patient this is a chronic issue. Hbg on admission 8.7 however 1 month ago was 11.3. Denies bleeding. Refused FOBT. -continue to monitor.  -recheck CBC in AM -anemia panel ordered with iron studies  #CKD. No signs of acute injury. At baseline. Cr baseline is 2.1.  -continue to monitor -avoid nephrotoxic medications  #CHF. At baseline. Last Echo 12/2014 showing G2DD and EF of 45-50%. Not symptomatic. Euvolemic.  Follows with cardiology -continue HF medications -SLIV, PO hydration  #COPD. At baseline. No signs of respiratory distress. -continue daily Qvar -albuterol prn  #HTN. BP was elevated on admission (231/142). Patient had not taken her BP medications. Came down after medications given in ED. -continue home BP regimen to include coreg 25mg  BID, Lasxic 40BID, Hydralazine 50mg  BID, Labetalol 200mg  BID,  lisinopril 20mg  - monitor vitals  FEN/GI: Heart Healthy Diet; SLIV Prophylaxis: Lovenox  Disposition: Admit to observation  History of Present Illness:  April [name] is a 40 y.o. female presenting with RLE cellulitis. PMH significant for COPD, CHF, HTN, and CKD. Patient states that about 2 days ago she believes she was bitten by a spider. She did not see a spider actually bite her however. States that since then she has had increasing redness and pain in the leg. She presented to the ED today because of this. She has not had a recorded temperature but states she ahs been feeling feverish. Of note, patient states she had a spider bite in the past and it reacted this same way. She ended up needed an I&D of her arm.   In the ED, patient was first given clindamycin however ED provider noticed that redness was worsening. Switched patient to vancomycin.   Review Of Systems: Per HPI. Also denies chest pain, dyspnea, abdominal pain, n/v. Otherwise the remainder of the systems were negative.  Patient Active Problem List   Diagnosis Date Noted  . Vitamin D deficiency 01/11/2015  . Fatigue 01/10/2015  . Dyspnea 01/10/2015  . Migraine 10/17/2014  . Chronic kidney disease 10/17/2014  . Asthma 09/04/2014  . Hypertension 09/03/2014  . Congestive heart failure (HCC) 09/03/2014    Past Medical History: Past Medical History:  Diagnosis Date  . Arthritis   . Asthma   . CHF (congestive heart failure) (HCC)   . COPD (chronic obstructive pulmonary disease) (HCC)   . Hypertension   . Renal disorder     Past Surgical History: History reviewed. No pertinent surgical history.  Social History: Social History  Substance Use Topics  . Smoking  status: Current Every Day Smoker    Packs/day: 0.25    Types: Cigarettes  . Smokeless tobacco: Never Used  . Alcohol use Yes     Comment: occasionally   Additional social history: stays with husb and Please also refer to relevant sections of  EMR.  Family History: Family History  Problem Relation Age of Onset  . Diabetes Mother   . Hypertension Mother   . Cancer Father   . Diabetes Maternal Grandmother   . Cancer Maternal Grandfather   . Cancer Paternal Grandfather    Allergies and Medications: No Known Allergies No current facility-administered medications on file prior to encounter.    Current Outpatient Prescriptions on File Prior to Encounter  Medication Sig Dispense Refill  . albuterol (PROVENTIL HFA;VENTOLIN HFA) 108 (90 BASE) MCG/ACT inhaler Inhale 2 puffs into the lungs every 4 (four) hours as needed for wheezing or shortness of breath. 1 Inhaler 0  . albuterol (PROVENTIL) (2.5 MG/3ML) 0.083% nebulizer solution Take 2.5 mg by nebulization every 6 (six) hours as needed for wheezing or shortness of breath.    . beclomethasone (QVAR) 80 MCG/ACT inhaler Inhale 1 puff into the lungs 2 (two) times daily. 1 Inhaler 3  . butalbital-acetaminophen-caffeine (FIORICET) 50-325-40 MG tablet Take 1-2 tablets by mouth every 6 (six) hours as needed for headache. 20 tablet 0  . carvedilol (COREG) 25 MG tablet Take 1 tablet (25 mg total) by mouth 2 (two) times daily with a meal. 60 tablet 2  . furosemide (LASIX) 40 MG tablet Take 1 tablet (40 mg total) by mouth 2 (two) times daily. 60 tablet 3  . hydrALAZINE (APRESOLINE) 50 MG tablet Take 1 tablet (50 mg total) by mouth 2 (two) times daily. 60 tablet 2  . labetalol (NORMODYNE) 200 MG tablet Take 200 mg by mouth 2 (two) times daily.    Marland Kitchen. lisinopril (PRINIVIL,ZESTRIL) 20 MG tablet Take 1 tablet (20 mg total) by mouth daily. 30 tablet 0  . oxymetazoline (AFRIN NASAL SPRAY) 0.05 % nasal spray Place 1 spray into right nostril 2 (two) times daily as needed for congestion. 30 mL 0  . topiramate (TOPAMAX) 50 MG tablet Take 1 tablet (50 mg total) by mouth 2 (two) times daily. 60 tablet 3    Objective: BP 123/61 (BP Location: Right Arm)   Pulse 87   Temp 98 F (36.7 C) (Oral)   Resp 15    Ht 5\' 4"  (1.626 m)   Wt 185 lb (83.9 kg)   LMP 08/10/2015 (Exact Date)   SpO2 100%   BMI 31.76 kg/m  Exam: General: alert, well-developed, mild distress, cooperative HEENT: NCAT, vision grossly intact, PERRLA, no injection and anicteric. EOMI. MMM, oral mucosa and oropharynx reveal no lesions or exudates.  Neck: supple, full ROM.  Lungs: CTAB, normal respiratory effort, no crackles, and no wheezes. Heart: RRR, no M/R/G.  Abdomen: Bowel sounds normal; abdomen soft and nontender.  Msk: no joint swelling, no joint warmth, and no redness over joints. Range of motion normal. Pulses: DP/PT are full and equal bilaterally. Extremities: RLE tender to palpation. Has 10x12cm area of erythema and swelling with a central bump. Neurologic: No focal deficits, CN grossly intact, strength diminished in RLE due to pain otherwise 5/5 globally, sensation grossly intact, A&Ox3. Skin: Intact without suspicious lesions or rashes. Warm and dry. Psych: Mood and affect are normal   Labs and Imaging: Results for orders placed or performed during the hospital encounter of 08/29/15 (from the past 24 hour(s))  Basic  metabolic panel     Status: Abnormal   Collection Time: 08/29/15  4:35 AM  Result Value Ref Range   Sodium 139 135 - 145 mmol/L   Potassium 3.7 3.5 - 5.1 mmol/L   Chloride 108 101 - 111 mmol/L   CO2 22 22 - 32 mmol/L   Glucose, Bld 93 65 - 99 mg/dL   BUN 21 (H) 6 - 20 mg/dL   Creatinine, Ser 1.61 (H) 0.44 - 1.00 mg/dL   Calcium 8.7 (L) 8.9 - 10.3 mg/dL   GFR calc non Af Amer 27 (L) >60 mL/min   GFR calc Af Amer 32 (L) >60 mL/min   Anion gap 9 5 - 15  CBC with Differential/Platelet     Status: Abnormal   Collection Time: 08/29/15  4:35 AM  Result Value Ref Range   WBC 13.2 (H) 4.0 - 10.5 K/uL   RBC 3.36 (L) 3.87 - 5.11 MIL/uL   Hemoglobin 8.7 (L) 12.0 - 15.0 g/dL   HCT 09.6 (L) 04.5 - 40.9 %   MCV 86.9 78.0 - 100.0 fL   MCH 25.9 (L) 26.0 - 34.0 pg   MCHC 29.8 (L) 30.0 - 36.0 g/dL   RDW  81.1 (H) 91.4 - 15.5 %   Platelets 380 150 - 400 K/uL   Neutrophils Relative % 76 %   Neutro Abs 10.2 (H) 1.7 - 7.7 K/uL   Lymphocytes Relative 14 %   Lymphs Abs 1.8 0.7 - 4.0 K/uL   Monocytes Relative 7 %   Monocytes Absolute 0.9 0.1 - 1.0 K/uL   Eosinophils Relative 2 %   Eosinophils Absolute 0.2 0.0 - 0.7 K/uL   Basophils Relative 1 %   Basophils Absolute 0.1 0.0 - 0.1 K/uL  D-dimer, quantitative (not at [facility])     Status: Abnormal   Collection Time: 08/29/15  5:49 AM  Result Value Ref Range   D-Dimer, Quant 1.24 (H) 0.00 - 0.50 ug/mL-FEU    Pincus Large, DO 08/29/2015, 10:11 AM PGY-3, [facility] FPTS Intern pager: [phone], text pages welcome

## 2015-08-30 DIAGNOSIS — D509 Iron deficiency anemia, unspecified: Secondary | ICD-10-CM

## 2015-08-30 DIAGNOSIS — I5032 Chronic diastolic (congestive) heart failure: Secondary | ICD-10-CM

## 2015-08-30 DIAGNOSIS — L03115 Cellulitis of right lower limb: Principal | ICD-10-CM

## 2015-08-30 DIAGNOSIS — I10 Essential (primary) hypertension: Secondary | ICD-10-CM

## 2015-08-30 LAB — BASIC METABOLIC PANEL
ANION GAP: 9 (ref 5–15)
BUN: 21 mg/dL — ABNORMAL HIGH (ref 6–20)
CHLORIDE: 108 mmol/L (ref 101–111)
CO2: 21 mmol/L — AB (ref 22–32)
Calcium: 8.4 mg/dL — ABNORMAL LOW (ref 8.9–10.3)
Creatinine, Ser: 2.23 mg/dL — ABNORMAL HIGH (ref 0.44–1.00)
GFR calc non Af Amer: 26 mL/min — ABNORMAL LOW (ref >60)
GFR, EST AFRICAN AMERICAN: 31 mL/min — AB (ref >60)
GLUCOSE: 88 mg/dL (ref 65–99)
Potassium: 3.9 mmol/L (ref 3.5–5.1)
Sodium: 138 mmol/L (ref 135–145)

## 2015-08-30 LAB — CBC
HEMATOCRIT: 25 % — AB (ref 36.0–46.0)
HEMOGLOBIN: 7.5 g/dL — AB (ref 12.0–15.0)
MCH: 26.4 pg (ref 26.0–34.0)
MCHC: 30 g/dL (ref 30.0–36.0)
MCV: 88 fL (ref 78.0–100.0)
Platelets: 325 10*3/uL (ref 150–400)
RBC: 2.84 MIL/uL — ABNORMAL LOW (ref 3.87–5.11)
RDW: 16.4 % — AB (ref 11.5–15.5)
WBC: 10.8 10*3/uL — ABNORMAL HIGH (ref 4.0–10.5)

## 2015-08-30 LAB — PREPARE RBC (CROSSMATCH)

## 2015-08-30 LAB — ABO/RH: ABO/RH(D): O POS

## 2015-08-30 MED ORDER — DOCUSATE SODIUM 100 MG PO CAPS: 100.0000 mg | ORAL_CAPSULE | Freq: Every day | ORAL | Status: DC | PRN

## 2015-08-30 MED ORDER — SODIUM CHLORIDE 0.9 % IV SOLN
Freq: Once | INTRAVENOUS | Status: AC
  Administered 2015-08-30: 18:00:00 via INTRAVENOUS

## 2015-08-30 NOTE — Care Management Note (Signed)
Case Management Note  Patient Details  Name: April [name] MRN: [id number] Date of Birth: 10/24/1975  Subjective/Objective:                 Spoke to patient at the bedside. She states that [place] Admitted with cellulitis and anticipated to DC on PO Abx. Patent states her insurance does provide coverage for medications and she has used it in the past. No CM needs identified.  PCP Dr Venetia NightAmao [facility] [address]   Action/Plan:   Expected Discharge Date:                  Expected Discharge Plan:  Home/Self Care  In-House Referral:  NA  Discharge planning Services  CM Consult  Post Acute Care Choice:  NA Choice offered to:  NA  DME Arranged:  N/A DME Agency:  NA  HH Arranged:  NA HH Agency:  NA  Status of Service:  Completed, signed off  If discussed at Long Length of Stay Meetings, dates discussed:    Additional Comments:  Lawerance SabalDebbie Jovan Colligan, RN 08/30/2015, 2:21 PM

## 2015-08-30 NOTE — Progress Notes (Signed)
Family Medicine Teaching Service Daily Progress Note Intern Pager: [phone]  [name] Medical record number: [id number] Date of birth: 1975-06-22 Age: 40 y.o. Gender: female  Primary Care Provider: Jaclyn Shaggy, MD Consultants: None Code Status: FULL  Pt Overview and Major Events to Date:  Admit 8/24 Started Vanc 8/24  Assessment and Plan: April [name] is a 40 y.o. female presenting with RLE pain, swelling, and redness. PMH is significant for COPD, CHF, HTN, and CKD.  #Cellulitis to RLE. Suspect due to spider bite. In setting of rapidly worsening pain and erythema in ED patient switched to IV antibiotics. WBC elevated at 13.2. Afebrile. Vitals are stable. Need to r/o compartment syndrome, necrotizing fasciitis, abscess.  CT Tib/Fib showing unlikely necrotizing fasciitis, unlikely abscess, no obvious imaging signs of compartment syndrome. Swelling has decreased significantly and reduced to within margin, however still tender to palpation and pain out of proportion to exam. -continue IV vancomycin for now -monitor cellulitis borders for spread; outlined on admission -Norco prn for pain -can consider d/c on Bactrim or Doxycycline to cover for MRSA once clinically looking better -if clinically looking worse or increase in pain and tightness of RLE, consider compartment syndrome and consult ortho.  #Elevated D-dimer. Collected in setting of increased leg pain. Followed up with LE dopplers which were normal.  #Anemia. Per patient this is a chronic issue. Hbg on admission 8.7 however 1 month ago was 11.3. Denies bleeding. Refused FOBT. CBC this am showing Hgb 7.5. Anemia panel with Iron 18 low, normal TIBC, normal ferritin. -Transfusion threshold 8.0 -can give 1 unit blood, check post trans HH, or consider po iron supplementation.  #CKD. No signs of acute injury. At baseline. Cr baseline is 2.1. -continue to monitor, am Cr 2.23 -avoid nephrotoxic  medications  #CHF. At baseline. Last Echo 12/2014 showing G2DD and EF of 45-50%. Not symptomatic. Euvolemic.  Follows with cardiology -continue HF medications -SLIV, PO hydration  #COPD. At baseline. No signs of respiratory distress. -continue daily Qvar -albuterol prn  #HTN, resolved.  BP was elevated on admission (231/142). Patient had not taken her BP medications. Came down after medications given in ED. -continue home BP regimen to include coreg 25mg  BID, Lasix 40BID, Hydralazine 50mg  BID, lisinopril 20mg  -Pt med list with coreg and labetalol, pt only on Coreg. - monitor vitals  FEN/GI: Heart Healthy Diet; SLIV Prophylaxis: Lovenox  Disposition: Home pending clinical improvement.  Subjective:  Patient complaining of very tender right leg, pain down to toes and foot. States the swelling and redness itself has reduced from yesterday.  She has remained afebrile overnight. No other complaints at current time.  Objective: Temp:  [97.9 F (36.6 C)-98.9 F (37.2 C)] 97.9 F (36.6 C) (08/25 0525) Pulse Rate:  [79-101] 85 (08/25 0525) Resp:  [13-24] 20 (08/25 0525) BP: (98-144)/(52-86) 130/75 (08/25 0824) SpO2:  [96 %-100 %] 97 % (08/25 0525) Weight:  [196 lb 6.9 oz (89.1 kg)] 196 lb 6.9 oz (89.1 kg) (08/24 1251) Physical Exam: General: alert, well-developed, in NAD, cooperative  HEENT: EOMI, PERRL, MMM, oral mucosa and oropharynx reveal no lesions or exudates.  Neck: supple, full ROM.  Lungs: CTAB, normal respiratory effort, no crackles, and no wheezes. Heart: RRR, no M/R/G noted Abdomen: Bowel sounds normal; abdomen soft and nontender.  Msk: no joint swelling, no joint warmth, and no redness over joints. Range of motion normal Pulses: DP/PT are full and equal bilaterally. Extremities: RLE warm and tender to palpation, area of erythema and swelling within marked  border, with a central white bump. No numbness or tingling in RLE.  Neurologic: No focal deficits, CN grossly  intact, strength diminished in RLE due to pain otherwise 5/5 globally, sensation grossly intact, A&Ox3.  Skin: Intact without suspicious lesions or rashes. Warm and dry. Psych: Mood and affect are normal  Laboratory:  Recent Labs Lab 08/29/15 0435 08/30/15 0534  WBC 13.2* 10.8*  HGB 8.7* 7.5*  HCT 29.2* 25.0*  PLT 380 325    Recent Labs Lab 08/29/15 0435 08/30/15 0534  NA 139 138  K 3.7 3.9  CL 108 108  CO2 22 21*  BUN 21* 21*  CREATININE 2.16* 2.23*  CALCIUM 8.7* 8.4*  GLUCOSE 93 88   Imaging/Diagnostic Tests:  Ct Tibia Fibula Right Wo Contrast  Result Date: 08/29/2015 CLINICAL DATA:  Erythema and swelling anteriorly in the right lower leg. Spider bite 3 days ago. Fever. Renal insufficiency. EXAM: CT OF THE RIGHT TIBIA FIBULA WITHOUT CONTRAST TECHNIQUE: Multidetector CT imaging was performed according to the standard protocol. Multiplanar CT image reconstructions were also generated. COMPARISON:  None. FINDINGS: Small knee effusion. Edema tracks superficial to the lateral patellar retinaculum. Small Baker's cyst. Focal infiltrative subcutaneous edema involving a 1.8 cm region of the subcutaneous tissues along the anterolateral margin of the anterior compartmental musculature, image 64/6 and image 22/11. There is also low-level subcutaneous edema tracking along the subcutaneous tissues and superficial fascia margin of the anterior and anterolateral calf. Subcutaneous edema overlies both malleoli and tracks along the anteromedial ankle and into the heel. Probable geode in the talus along the posterior subtalar facet. No drainable abscess identified on today' s noncontrast exam. No effacement of the adipose tissue in the compartmental musculature to suggest obvious imaging signs of compartment syndrome. IMPRESSION: 1. There is a focal region of infiltrative subcutaneous edema along the anterolateral portion of the calf, centered about 10 cm below the tibial plateau. There is also  subcutaneous edema tracking along the anterolateral and lateral subcutaneous tissues of the calf. Cellulitis is certainly a possibility. I do not see any abscess, compelling findings of fasciitis, or acute bony abnormality. 2. Small knee effusion and small Baker's cyst. 3. Subcutaneous edema along the heel, and overlying the malleoli in the ankle. Electronically Signed   By: Gaylyn RongWalter  Liebkemann M.D.   On: 08/29/2015 17:37   Freddrick MarchYashika Saraann Enneking, MD 08/30/2015, 8:31 AM PGY-1, [facility] FPTS Intern pager: [phone], text pages welcome

## 2015-08-30 NOTE — Progress Notes (Signed)
Patient refusing blood at this time. Pt. Has been educated and encouraged to receive the blood, and still refuses. MD made aware. Will continue to monitor.

## 2015-08-31 DIAGNOSIS — I5032 Chronic diastolic (congestive) heart failure: Secondary | ICD-10-CM

## 2015-08-31 DIAGNOSIS — D509 Iron deficiency anemia, unspecified: Secondary | ICD-10-CM

## 2015-08-31 LAB — CBC
HCT: 29.8 % — ABNORMAL LOW (ref 36.0–46.0)
Hemoglobin: 8.8 g/dL — ABNORMAL LOW (ref 12.0–15.0)
MCH: 26 pg (ref 26.0–34.0)
MCHC: 29.5 g/dL — ABNORMAL LOW (ref 30.0–36.0)
MCV: 88.2 fL (ref 78.0–100.0)
PLATELETS: 329 10*3/uL (ref 150–400)
RBC: 3.38 MIL/uL — ABNORMAL LOW (ref 3.87–5.11)
RDW: 16.4 % — AB (ref 11.5–15.5)
WBC: 9 10*3/uL (ref 4.0–10.5)

## 2015-08-31 LAB — BASIC METABOLIC PANEL
ANION GAP: 10 (ref 5–15)
BUN: 17 mg/dL (ref 6–20)
CALCIUM: 8.6 mg/dL — AB (ref 8.9–10.3)
CO2: 23 mmol/L (ref 22–32)
CREATININE: 2.43 mg/dL — AB (ref 0.44–1.00)
Chloride: 108 mmol/L (ref 101–111)
GFR, EST AFRICAN AMERICAN: 28 mL/min — AB (ref >60)
GFR, EST NON AFRICAN AMERICAN: 24 mL/min — AB (ref >60)
GLUCOSE: 82 mg/dL (ref 65–99)
Potassium: 3.9 mmol/L (ref 3.5–5.1)
Sodium: 141 mmol/L (ref 135–145)

## 2015-08-31 LAB — TYPE AND SCREEN
ABO/RH(D): O POS
ANTIBODY SCREEN: NEGATIVE
UNIT DIVISION: 0

## 2015-08-31 LAB — PREGNANCY, URINE: Preg Test, Ur: NEGATIVE

## 2015-08-31 MED ORDER — TRAZODONE HCL 50 MG PO TABS
50.0000 mg | ORAL_TABLET | Freq: Every evening | ORAL | Status: AC | PRN
  Administered 2015-08-31: 50 mg via ORAL
  Filled 2015-08-31: qty 1

## 2015-08-31 NOTE — Progress Notes (Signed)
Patient refused scds. They are in the room.

## 2015-08-31 NOTE — Progress Notes (Signed)
Family Medicine Teaching Service Daily Progress Note Intern Pager: [phone]  [name] Medical record number: [id number] Date of birth: 19-Mar-1975 Age: 40 y.o. Gender: [name]  Primary Care Provider: Jaclyn ShaggyEnobong, Amao, MD Consultants: None Code Status: FULL  Pt Overview and Major Events to Date:  Admit 8/24 Started Vanc 8/24  Assessment and Plan: April [name] is a 40 y.o. [name] presenting with RLE pain, swelling, and redness. PMH is significant for COPD, CHF, HTN, and CKD.  #Cellulitis to RLE. Suspect due to spider bite. WBC elevated at 13.2. Afebrile. Vitals are stable. CT Tib/Fib showing unlikely necrotizing fasciitis, unlikely abscess, no obvious imaging signs of compartment syndrome. Low clinical suspicion for necrotizing fasciitis. Low clinical suspicion for compartment syndrome given able to move toes and ankle without pain. Swelling and redness has decreased, however still tender to palpation. -continue IV vancomycin -monitor cellulitis borders for spread; outlined on admission -Norco prn for pain -can consider d/c on Bactrim or Doxycycline to cover for MRSA once clinically looking better  #plan for pregnancy -Urine preg test pending. -will adjust medications pending result of UPT  #Elevated D-dimer. Collected in setting of increased leg pain. Followed up with LE dopplers which were normal.  #Anemia. Per patient this is a chronic issue. Hbg on admission 8.7 however 1 month ago was 11.3.  CBC 8/25 with Hgb 7.5. Anemia panel with Iron 18 low, normal TIBC, normal ferritin. Suspect anemia of chronic disease -s/p 1 unit blood on 8/25. Post transfusion HH 8.8 -consider po iron supplementation outpatient.  #CKD. No signs of acute injury. At baseline. Cr baseline is 2.1. -continue to monitor, am Cr 2.43 -avoid nephrotoxic medications  #CHF. At baseline. Last Echo 12/2014 showing G2DD and EF of 45-50%. Not symptomatic. Euvolemic.  Follows with  cardiology. -continue HF medications -SLIV, PO hydration  #COPD. At baseline. No signs of respiratory distress. -continue daily Qvar -albuterol prn  #HTN, resolved. Patient normotensive.  -continue home BP regimen to include coreg 25mg  BID, Lasix 40BID, Hydralazine 50mg  BID, lisinopril 20mg  -monitor vitals  FEN/GI: Heart Healthy Diet; SLIV Prophylaxis: Patient has been refusing Lovenox and SCDs. Will discuss risks of doing so.  Disposition: Home pending clinical improvement.  Subjective:  Patient complaining of tender right leg. No longer c/o pain in toes and feet. States the swelling and redness itself has reduced from yesterday.  She has remained afebrile overnight. No other complaints at current time but wanting to go home soon.  Objective: Temp:  [97.5 F (36.4 C)-98 F (36.7 C)] 97.5 F (36.4 C) (08/26 0500) Pulse Rate:  [71-82] 82 (08/26 0500) Resp:  [17-20] 18 (08/26 0500) BP: (105-143)/(48-81) 120/76 (08/26 0500) SpO2:  [96 %-100 %] 98 % (08/26 0500)   Physical Exam: General: alert, well-developed, in NAD Lungs: CTAB, normal respiratory effort, no crackles, and no wheezes. Heart: RRR, no M/R/G noted Abdomen: Bowel sounds normal; abdomen soft and nontender.  Msk: no joint swelling, no joint warmth, and no redness over joints. Range of motion normal. Able to move toes and ankle without significant pain. Pulses: DP/PT are full and equal bilaterally. Extremities: RLE warm and tender to palpation, area of erythema and swelling within marked border, with a central white bump. No numbness or tingling in RLE.  Neurologic: No focal deficits, CN grossly intact, strength diminished in RLE due to pain otherwise 5/5 globally, sensation grossly intact, A&Ox3.  Skin: Intact without suspicious lesions or rashes. Warm and dry. Psych: Mood and affect are normal  Laboratory:  Recent Labs Lab  08/29/15 0435 08/30/15 0534 08/31/15 0548  WBC 13.2* 10.8* 9.0  HGB 8.7* 7.5* 8.8*   HCT 29.2* 25.0* 29.8*  PLT 380 325 329    Recent Labs Lab 08/29/15 0435 08/30/15 0534 08/31/15 0548  NA 139 138 141  K 3.7 3.9 3.9  CL 108 108 108  CO2 22 21* 23  BUN 21* 21* 17  CREATININE 2.16* 2.23* 2.43*  CALCIUM 8.7* 8.4* 8.6*  GLUCOSE 93 88 82   Imaging/Diagnostic Tests:  No results found. Freddrick March, MD 08/31/2015, 8:47 AM PGY-1, [facility] FPTS Intern pager: [phone], text pages welcome

## 2015-08-31 NOTE — Progress Notes (Addendum)
Lab contacted RN stating they do not do point of care urine samples and need pregnancy urine test ordered. MD paged to make aware. MD verbal order to pregnancy urine test.

## 2015-08-31 NOTE — Progress Notes (Signed)
I called phlebotomy, told them Dr ordered this mornings labs STAT.  She confirmed. This.

## 2015-09-01 LAB — CBC
HCT: 31.7 % — ABNORMAL LOW (ref 36.0–46.0)
Hemoglobin: 9.4 g/dL — ABNORMAL LOW (ref 12.0–15.0)
MCH: 26 pg (ref 26.0–34.0)
MCHC: 29.7 g/dL — ABNORMAL LOW (ref 30.0–36.0)
MCV: 87.8 fL (ref 78.0–100.0)
PLATELETS: 374 10*3/uL (ref 150–400)
RBC: 3.61 MIL/uL — AB (ref 3.87–5.11)
RDW: 16 % — AB (ref 11.5–15.5)
WBC: 8 10*3/uL (ref 4.0–10.5)

## 2015-09-01 LAB — BASIC METABOLIC PANEL
Anion gap: 10 (ref 5–15)
BUN: 21 mg/dL — AB (ref 6–20)
CALCIUM: 8.8 mg/dL — AB (ref 8.9–10.3)
CO2: 22 mmol/L (ref 22–32)
Chloride: 110 mmol/L (ref 101–111)
Creatinine, Ser: 2.33 mg/dL — ABNORMAL HIGH (ref 0.44–1.00)
GFR calc Af Amer: 29 mL/min — ABNORMAL LOW (ref >60)
GFR, EST NON AFRICAN AMERICAN: 25 mL/min — AB (ref >60)
GLUCOSE: 83 mg/dL (ref 65–99)
POTASSIUM: 3.9 mmol/L (ref 3.5–5.1)
SODIUM: 142 mmol/L (ref 135–145)

## 2015-09-01 MED ORDER — TRAZODONE HCL 50 MG PO TABS
50.0000 mg | ORAL_TABLET | Freq: Once | ORAL | Status: AC
  Administered 2015-09-02: 50 mg via ORAL
  Filled 2015-09-01: qty 1

## 2015-09-01 MED ORDER — POVIDONE-IODINE 10 % EX SWAB
2.0000 "application " | Freq: Once | CUTANEOUS | Status: AC
  Administered 2015-09-02: 2 via TOPICAL

## 2015-09-01 MED ORDER — CEFAZOLIN IN D5W 1 GM/50ML IV SOLN
1.0000 g | Freq: Three times a day (TID) | INTRAVENOUS | Status: DC
  Administered 2015-09-01 – 2015-09-02 (×2): 1 g via INTRAVENOUS
  Filled 2015-09-01 (×5): qty 50

## 2015-09-01 MED ORDER — CEFAZOLIN SODIUM-DEXTROSE 2-4 GM/100ML-% IV SOLN
2.0000 g | INTRAVENOUS | Status: DC
  Filled 2015-09-01 (×2): qty 100

## 2015-09-01 MED ORDER — CHLORHEXIDINE GLUCONATE 4 % EX LIQD
60.0000 mL | Freq: Once | CUTANEOUS | Status: AC
  Administered 2015-09-02: 4 via TOPICAL
  Filled 2015-09-01: qty 60

## 2015-09-01 NOTE — Evaluation (Addendum)
Physical Therapy Evaluation Patient Details Name: April [name] MRN: [id number] DOB: Feb 05, 1975 Today's Date: 09/01/2015   History of Present Illness  Patient is a 40 yo female admitted 08/29/15 with RLE pain, swelling, and redness.  Patient with RLE abscess/cellulitis.    PMH:  COPD, CHF, HTN, CKD  Clinical Impression  Patient presents with problems listed below.  Will benefit from acute PT to maximize functional independence prior to discharge home with husband.  As pain decreases, patient should continue to improve with mobility.  Did well with RW today.  Do not anticipate any f/u PT needs at d/c.   Will need RW and 3-in-1 BSC for home use.    Follow Up Recommendations No PT follow up;Supervision - Intermittent    Equipment Recommendations  Rolling walker with 5" wheels;3in1 (PT)    Recommendations for Other Services       Precautions / Restrictions Precautions Precautions: None Restrictions Weight Bearing Restrictions: No      Mobility  Bed Mobility Overal bed mobility: Modified Independent             General bed mobility comments: Increased time  Transfers Overall transfer level: Needs assistance Equipment used: Rolling walker (2 wheeled) Transfers: Sit to/from Stand Sit to Stand: Min guard         General transfer comment: Verbal cues for hand placement.  Assist for safety.  Ambulation/Gait Ambulation/Gait assistance: Min guard Ambulation Distance (Feet): 30 Feet Assistive device: Rolling walker (2 wheeled) Gait Pattern/deviations: Step-to pattern;Decreased stance time - right;Decreased step length - left;Decreased step length - right;Decreased stride length;Decreased weight shift to right;Antalgic Gait velocity: decreased Gait velocity interpretation: Below normal speed for age/gender General Gait Details: Verbal cues for safe use of RW.  Patient with slow, antalgic gait pattern.  Stairs            Wheelchair Mobility    Modified  Rankin (Stroke Patients Only)       Balance                                             Pertinent Vitals/Pain Pain Assessment: 0-10 Pain Score: 10-Worst pain ever Pain Location: RLE Pain Descriptors / Indicators: Grimacing;Sore Pain Intervention(s): Limited activity within patient's tolerance;Monitored during session;Repositioned;Patient requesting pain meds-RN notified    Home Living Family/patient expects to be discharged to:: Private residence Living Arrangements: Spouse/significant other Available Help at Discharge: Family;Available PRN/intermittently (Husband works) Type of Home: Apartment Home Access: Stairs to enter (3rd floor apartment) Entrance Stairs-Rails: Doctor, general practiceight;Left Entrance Stairs-Number of Steps: 2 flights Home Layout: One level Home Equipment: None      Prior Function Level of Independence: Independent               Hand Dominance        Extremity/Trunk Assessment   Upper Extremity Assessment: Overall WFL for tasks assessed           Lower Extremity Assessment: Generalized weakness;RLE deficits/detail RLE Deficits / Details: Decreased strength due to pain    Cervical / Trunk Assessment: Normal  Communication   Communication: No difficulties  Cognition Arousal/Alertness: Awake/alert Behavior During Therapy: WFL for tasks assessed/performed Overall Cognitive Status: Within Functional Limits for tasks assessed                      General Comments      Exercises  Assessment/Plan    PT Assessment Patient needs continued PT services  PT Diagnosis Difficulty walking;Abnormality of gait;Generalized weakness;Acute pain   PT Problem List Decreased strength;Decreased activity tolerance;Decreased balance;Decreased mobility;Decreased knowledge of use of DME;Impaired sensation;Decreased skin integrity;Pain  PT Treatment Interventions DME instruction;Gait training;Stair training;Functional mobility  training;Therapeutic activities;Patient/family education   PT Goals (Current goals can be found in the Care Plan section) Acute Rehab PT Goals Patient Stated Goal: To decrease pain PT Goal Formulation: With patient/family Time For Goal Achievement: 09/08/15 Potential to Achieve Goals: Good    Frequency Min 3X/week   Barriers to discharge Inaccessible home environment;Decreased caregiver support Patient lives in 3rd floor apartment with 2 flights of stairs.  Husband works, so patient home alone.    Co-evaluation               End of Session Equipment Utilized During Treatment: Gait belt Activity Tolerance: Patient limited by pain Patient left: in bed;with call bell/phone within reach;with family/visitor present Nurse Communication: Mobility status;Patient requests pain meds         Time: 1935-1953 PT Time Calculation (min) (ACUTE ONLY): 18 min   Charges:   PT Evaluation $PT Eval Moderate Complexity: 1 Procedure     PT G Codes:        Vena Austria 09/23/2015, 8:32 PM Durenda Hurt. Renaldo Fiddler, [facility] Acute Rehab Services Pager [phone]

## 2015-09-01 NOTE — Progress Notes (Signed)
Pharmacy Antibiotic Note  April CarrowLakricia [name] is a 40 y.o. female admitted on 08/29/2015 with R leg cellulitis 2/2 to suspected spider bite per patient. Pt taking Keflex x2 days PTA. Pharmacy has been consulted for vancomycin dosing. Was started on clindamycin here however redness worsened so pt was switched vancomycin. Erythema improving, small pustule present but pt not tolerating bedside I&D. Per notes, plan is to transition to Bactrim or doxycycline PO soon, so will hold off on vancomycin trough for now.  Plan:  -Continue vancomycin 1000 mg IV q24 -Monitor clinical picture, renal function, VT as indicated  Height: 5\' 4"  (162.6 cm) Weight: 196 lb 6.9 oz (89.1 kg) IBW/kg (Calculated) : 54.7  Temp (24hrs), Avg:97.9 F (36.6 C), Min:97.9 F (36.6 C), Max:97.9 F (36.6 C)   Recent Labs Lab 08/29/15 0435 08/30/15 0534 08/31/15 0548 09/01/15 0533  WBC 13.2* 10.8* 9.0 8.0  CREATININE 2.16* 2.23* 2.43* 2.33*    Estimated Creatinine Clearance: 34.7 mL/min (by C-G formula based on SCr of 2.33 mg/dL).    No Known Allergies  Antimicrobials this admission: 8/24 Clindamycin >> 8/24 8/24 Vancomycin >>  Dose adjustments this admission: none  Microbiology results: No cultures sent  Thank you for allowing pharmacy to be a part of this patient's care.  Fredonia HighlandMichael Jakyri Brunkhorst, PharmD PGY-1 Pharmacy Resident Pager: [phone] 09/01/2015

## 2015-09-01 NOTE — Progress Notes (Signed)
Indurated area on right mid shin draining mod amt of sanguinous / pusy drainage.  She is quite intolerant of pain.  Area cleansed and wet dressing applied with DSD and Kling to cover.  Will change prn.  MD notified.
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
## 2015-09-01 NOTE — Progress Notes (Signed)
Family Medicine Teaching Service Daily Progress Note Intern Pager: [phone]  [name] Medical record number: [id number] Date of birth: 02-07-1975 Age: 40 y.o. Gender: female  Primary Care Provider: Jaclyn ShaggyEnobong, Amao, MD Consultants: None Code Status: FULL  Pt Overview and Major Events to Date:  8/24 Admit for RLE cellulitis 8/24 Vancomycin initiated 8/24 Vanc d/c, clindamycin PO started  Assessment and Plan: April [name] is a 40 y.o. female presenting with RLE pain, swelling, and redness. PMH is significant for COPD, CHF, HTN, and CKD.  #Cellulitis to RLE. - still reports tenderness, but patient laying on site when distracted without issue WBC 8, afebrile, area of erythema improving -Vancomycin ( 8/24-27) transition to oral clindamycin ( D1 8/27) given desire to conceive and having unprotected intercourse -monitor cellulitis borders for spread; outlined on admission -Norco prn for pain  #Plan for pregnancy -Urine preg neg - will not adjust teratogenic medications at this time given her many complicated co morbidities.  - she was counseled extensively regarding the risks of pregnancy given he age, heart failure and hypertension diagnoses. She was also counseled regarding the risks of pregnancy while taking her current highly teratogenic medications including lisinopril and topomax - she expressed her understadning and desire to still proceed with conceiving - she will need to be referred to high risk OB for pre conception counseling and medication adjustment  #Elevated D-dimer.  - LE dopplers negative  #Anemia. Hgb 9.4< 8.8  -s/p 1 unit blood on 8/25. -consider po iron supplementation outpatient.  #CKD. Cr 2.33< 2.43 -continue to monitor -avoid nephrotoxic medications  #CHF. At baseline. Last Echo 12/2014 showing G2DD and EF of 45-50%. Not symptomatic. Euvolemic.  Follows with cardiology. -continue HF medications -SLIV, PO  hydration  #COPD. At baseline. No signs of respiratory distress. -continue daily Qvar -albuterol prn  #HTN, stable -continue home BP regimen to include coreg 25mg  BID, Lasix 40BID, Hydralazine 50mg  BID, lisinopril 20mg  -monitor vitals  FEN/GI: Heart Healthy Diet; SLIV Prophylaxis: Patient has been refusing Lovenox and SCDs. Will discuss risks of doing so.  Disposition: Home pending clinical improvement.  Subjective:  Reports continued leg pain "maybe improving", denies SOB, chest pain, and pain  Objective: Temp:  [97.9 F (36.6 C)] 97.9 F (36.6 C) (08/26 2114) Pulse Rate:  [75-83] 81 (08/26 2114) Resp:  [17-18] 18 (08/26 2114) BP: (108-142)/(61-82) 142/81 (08/26 2114) SpO2:  [99 %-100 %] 100 % (08/26 2114)   Physical Exam: General: alert, well-developed, in NAD Lungs: CTAB, normal respiratory effort, no crackles, and no wheezes. Heart: RRR, no M/R/G noted Abdomen: Bowel sounds normal; abdomen soft and nontender.  Msk: no joint swelling, no joint warmth, and no redness over joints. Range of motion normal. Able to move bilateral toes and ankles without significant pain. Extremities: anterior 3-4 diameter marked area of warmth, tenderness, with central papule with no evidence of purulence  Neurologic: AOx3 Psych: Mood and affect are normal  Laboratory:  Recent Labs Lab 08/30/15 0534 08/31/15 0548 09/01/15 0533  WBC 10.8* 9.0 8.0  HGB 7.5* 8.8* 9.4*  HCT 25.0* 29.8* 31.7*  PLT 325 329 374    Recent Labs Lab 08/30/15 0534 08/31/15 0548 09/01/15 0533  NA 138 141 142  K 3.9 3.9 3.9  CL 108 108 110  CO2 21* 23 22  BUN 21* 17 21*  CREATININE 2.23* 2.43* 2.33*  CALCIUM 8.4* 8.6* 8.8*  GLUCOSE 88 82 83   Imaging/Diagnostic Tests:  No results found. Bonney AidAlyssa A Gwynn Crossley, MD 09/01/2015, 1:35 PM PGY-3,  [facility] Family Medicine FPTS Intern pager: [phone], text pages welcome

## 2015-09-02 ENCOUNTER — Encounter (HOSPITAL_COMMUNITY)
Admission: EM | Discharge status: Against Medical Advice | Payer: Self-pay | Source: Home / Self Care | Attending: Family Medicine

## 2015-09-02 LAB — SURGICAL PCR SCREEN
MRSA, PCR: NEGATIVE
Staphylococcus aureus: NEGATIVE

## 2015-09-02 SURGERY — IRRIGATION AND DEBRIDEMENT EXTREMITY
Anesthesia: General | Site: Leg Lower | Laterality: Right

## 2015-09-02 MED ORDER — TRAZODONE HCL 50 MG PO TABS
50.0000 mg | ORAL_TABLET | Freq: Once | ORAL | Status: AC
  Administered 2015-09-02: 50 mg via ORAL
  Filled 2015-09-02: qty 1

## 2015-09-02 MED ORDER — CEFAZOLIN IN D5W 1 GM/50ML IV SOLN
1.0000 g | Freq: Three times a day (TID) | INTRAVENOUS | Status: DC
  Administered 2015-09-02 – 2015-09-04 (×6): 1 g via INTRAVENOUS
  Filled 2015-09-02 (×9): qty 50

## 2015-09-02 NOTE — Progress Notes (Signed)
Physical Therapy Treatment Patient Details Name: Floria Brandau MRN: [id number] DOB: Jan 04, 1976 Today's Date: 09/02/2015    History of Present Illness Patient is a 40 yo female admitted 08/29/15 with RLE pain, swelling, and redness 2* spider bite. Patient with RLE abscess/cellulitis.  Scheduled for I&D 09/02/15 at 2:00 pm.   PMH:  COPD, CHF, HTN, CKD    PT Comments    Pt progressing well with mobility, she tolerated increased distance of 100' with ambulation. Distance limited by RLE pain. Noted pt scheduled for I&D this afternoon.   Follow Up Recommendations  No PT follow up;Supervision - Intermittent     Equipment Recommendations  Rolling walker with 5" wheels;3in1 (PT)    Recommendations for Other Services       Precautions / Restrictions Precautions Precautions: None Restrictions Weight Bearing Restrictions: No    Mobility  Bed Mobility Overal bed mobility: Modified Independent             General bed mobility comments: Increased time  Transfers Overall transfer level: Needs assistance Equipment used: Rolling walker (2 wheeled)   Sit to Stand: Supervision         General transfer comment: Verbal cues for hand placement.  Assist for safety.  Ambulation/Gait Ambulation/Gait assistance: Supervision Ambulation Distance (Feet): 100 Feet Assistive device: Rolling walker (2 wheeled) Gait Pattern/deviations: Step-to pattern;Decreased step length - right;Decreased step length - left;Decreased weight shift to right;Antalgic Gait velocity: decreased Gait velocity interpretation: Below normal speed for age/gender General Gait Details:   Patient with slow, antalgic gait pattern, no LOB, 10/10 pain RLE   Stairs            Wheelchair Mobility    Modified Rankin (Stroke Patients Only)       Balance Overall balance assessment: Modified Independent                                  Cognition Arousal/Alertness: Awake/alert Behavior  During Therapy: WFL for tasks assessed/performed Overall Cognitive Status: Within Functional Limits for tasks assessed                      Exercises      General Comments        Pertinent Vitals/Pain Pain Score: 10-Worst pain ever Pain Location: RLE Pain Descriptors / Indicators: Sore Pain Intervention(s): Premedicated before session;Monitored during session;Limited activity within patient's tolerance    Home Living                      Prior Function            PT Goals (current goals can now be found in the care plan section) Acute Rehab PT Goals Patient Stated Goal: To decrease pain PT Goal Formulation: With patient/family Time For Goal Achievement: 09/08/15 Potential to Achieve Goals: Good Progress towards PT goals: Progressing toward goals    Frequency  Min 3X/week    PT Plan Current plan remains appropriate    Co-evaluation             End of Session Equipment Utilized During Treatment: Gait belt Activity Tolerance: Patient limited by pain Patient left: in bed;with call bell/phone within reach;with family/visitor present     Time: 1205-1221 PT Time Calculation (min) (ACUTE ONLY): 16 min  Charges:  $Gait Training: 8-22 mins  G Codes:      Tamala SerUhlenberg, Noelani Harbach Kistler 09/02/2015, 12:45 PM [phone]

## 2015-09-02 NOTE — Progress Notes (Signed)
Family Medicine Teaching Service Daily Progress Note Intern Pager: [phone]  [name] Medical record number: [id number] Date of birth: 02-Apr-1975 Age: 40 y.o. Gender: female  Primary Care Provider: Jaclyn Shaggy, MD Consultants: None Code Status: FULL  Pt Overview and Major Events to Date:  8/24 Admit for RLE cellulitis 8/24 Vancomycin initiated 8/27 Vanc d/c, clindamycin PO started 8/27 Vanc resumed, start Ancef  Assessment and Plan: April [name] is a 40 y.o. female presenting with RLE pain, swelling, and redness. PMH is significant for COPD, CHF, HTN, and CKD.  #Cellulitis to RLE. - still reports tenderness. WBC 8, afebrile, area of erythema improving.  -monitor cellulitis borders for spread; outlined on admission -Norco prn for pain -Ortho reccs: I&D the abscess. Resumed IV Vanc and Ancef (Day 1). Plan for OR this afternoon for I&D. -if does well, oral antibiotics tomorrow and plan for dc home.  #Plan for pregnancy -Urine preg neg - will not adjust teratogenic medications at this time given her many complicated co morbidities.  - she was counseled extensively regarding the risks of pregnancy given he age, heart failure and hypertension diagnoses. She was also counseled regarding the risks of pregnancy while taking her current highly teratogenic medications including lisinopril and topomax - she expressed her understanding and desire to still proceed with conceiving - she will need to be referred to high risk OB for pre conception counseling and medication adjustment  #Elevated D-dimer.  - LE dopplers negative  #Anemia. Hgb 9.4< 8.8  -s/p 1 unit blood on 8/25. -consider po iron supplementation outpatient.  #CKD. Cr 2.33 this am improved from 2.43 yesterday -continue to monitor -avoid nephrotoxic medications  #CHF. At baseline. Last Echo 12/2014 showing G2DD and EF of 45-50%. Not symptomatic. Euvolemic.  Follows with  cardiology. -continue HF medications -SLIV, PO hydration  #COPD. At baseline. No signs of respiratory distress. -continue daily Qvar -albuterol prn  #HTN, stable -continue home BP regimen to include coreg 25mg  BID, Lasix 40BID, Hydralazine 50mg  BID, lisinopril 20mg  -monitor vitals  FEN/GI: Heart Healthy Diet; SLIV Prophylaxis: Patient has been refusing Lovenox and SCDs. Will discuss risks of doing so.  Disposition: Home pending clinical improvement.  Subjective:  Reports continued leg pain but also wants to go home and eat. Denies SOB, denies chest pain.  Objective: Temp:  [98.2 F (36.8 C)-98.4 F (36.9 C)] 98.2 F (36.8 C) (08/28 1645) Pulse Rate:  [71-76] 75 (08/28 1645) Resp:  [18-20] 20 (08/28 1645) BP: (111-176)/(74-86) 176/86 (08/28 1645) SpO2:  [98 %-100 %] 100 % (08/28 1645)   Physical Exam: General: alert, well-developed, in NAD Lungs: CTAB, normal respiratory effort, no crackles, and no wheezes. Heart: RRR, no M/R/G noted Abdomen: Bowel sounds normal; abdomen soft and nontender.  Msk: no joint swelling, no joint warmth, and no redness over joints. Range of motion normal. Able to move bilateral toes and ankles without significant pain. Extremities: anterior 3-4 diameter marked area of warmth, tenderness, with central papule with no evidence of purulence  Neurologic: AOx3 Psych: Mood and affect are normal  Laboratory:  Recent Labs Lab 08/30/15 0534 08/31/15 0548 09/01/15 0533  WBC 10.8* 9.0 8.0  HGB 7.5* 8.8* 9.4*  HCT 25.0* 29.8* 31.7*  PLT 325 329 374    Recent Labs Lab 08/30/15 0534 08/31/15 0548 09/01/15 0533  NA 138 141 142  K 3.9 3.9 3.9  CL 108 108 110  CO2 21* 23 22  BUN 21* 17 21*  CREATININE 2.23* 2.43* 2.33*  CALCIUM 8.4* 8.6*  8.8*  GLUCOSE 88 82 83   Imaging/Diagnostic Tests:  No results found. Freddrick MarchYashika Tayvia Faughnan, MD 09/02/2015, 5:02 PM PGY-1, [facility] FPTS Intern pager: [phone], text pages welcome

## 2015-09-02 NOTE — Progress Notes (Signed)
Patient stated she wants to go back to her room and eat. I informed that patient that is she ate food she would be cancelled. Patient stated that was ok. I ask patient if she was willing to accept that what was going on could get worse. Patient stated "she did not care." Dr. Linna CapriceSwinteck notified in OR stated he did not want patient to go back to room. Patient refused to stay in short stay any longer. OR called for transport back to patient's room floor notified.

## 2015-09-02 NOTE — Interval H&P Note (Signed)
History and Physical Interval Note:  09/02/2015 2:43 PM  April [name]  has presented today for surgery, with the diagnosis of Right Lower Leg Abscess  The various methods of treatment have been discussed with the patient and family. After consideration of risks, benefits and other options for treatment, the patient has consented to  Procedure(s): IRRIGATION AND DEBRIDEMENT EXTREMITY (Right) as a surgical intervention .  The patient's history has been reviewed, patient examined, no change in status, stable for surgery.  I have reviewed the patient's chart and labs.  Questions were answered to the patient's satisfaction.    Alanda Colton, Cloyde ReamsBrian James

## 2015-09-02 NOTE — Progress Notes (Signed)
Patient refused to have surgical procedure done today. She stated that she would rather eat. She understands that her abscess can worsen and cause damage to her leg, and she said that she did not care. Patient states that she would rather reschedule her case for another day because she was so hungry. I have discussed the patient with Dr. Renaye Rakersim Murphy, who is on-call tomorrow. He is planning to take her to the operating room tomorrow. Nothing by mouth after midnight.

## 2015-09-02 NOTE — Progress Notes (Signed)
Patient updated on status of wait time. Pt stated that she wanted to go back to her room and eat. Dr Veda CanningSwintek in to speak to patient. Pt agreed to wait.
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
## 2015-09-03 ENCOUNTER — Inpatient Hospital Stay (HOSPITAL_COMMUNITY): Payer: Self-pay | Admitting: Certified Registered Nurse Anesthetist

## 2015-09-03 ENCOUNTER — Encounter (HOSPITAL_COMMUNITY)
Admission: EM | Discharge status: Against Medical Advice | Payer: Self-pay | Source: Home / Self Care | Attending: Family Medicine

## 2015-09-03 ENCOUNTER — Encounter (HOSPITAL_COMMUNITY): Payer: Self-pay | Admitting: Certified Registered Nurse Anesthetist

## 2015-09-03 HISTORY — PX: I & D EXTREMITY: SHX5045

## 2015-09-03 LAB — CBC
HEMATOCRIT: 32.5 % — AB (ref 36.0–46.0)
HEMOGLOBIN: 9.9 g/dL — AB (ref 12.0–15.0)
MCH: 26.1 pg (ref 26.0–34.0)
MCHC: 30.5 g/dL (ref 30.0–36.0)
MCV: 85.5 fL (ref 78.0–100.0)
Platelets: 376 10*3/uL (ref 150–400)
RBC: 3.8 MIL/uL — ABNORMAL LOW (ref 3.87–5.11)
RDW: 15.5 % (ref 11.5–15.5)
WBC: 5.9 10*3/uL (ref 4.0–10.5)

## 2015-09-03 LAB — BASIC METABOLIC PANEL
Anion gap: 10 (ref 5–15)
BUN: 24 mg/dL — AB (ref 6–20)
CHLORIDE: 105 mmol/L (ref 101–111)
CO2: 23 mmol/L (ref 22–32)
CREATININE: 2.44 mg/dL — AB (ref 0.44–1.00)
Calcium: 8.7 mg/dL — ABNORMAL LOW (ref 8.9–10.3)
GFR calc Af Amer: 27 mL/min — ABNORMAL LOW (ref >60)
GFR calc non Af Amer: 24 mL/min — ABNORMAL LOW (ref >60)
GLUCOSE: 101 mg/dL — AB (ref 65–99)
Potassium: 3.8 mmol/L (ref 3.5–5.1)
Sodium: 138 mmol/L (ref 135–145)

## 2015-09-03 SURGERY — IRRIGATION AND DEBRIDEMENT EXTREMITY
Anesthesia: General | Site: Leg Lower | Laterality: Right

## 2015-09-03 MED ORDER — OXYCODONE HCL 5 MG/5ML PO SOLN: 5.0000 mg | Freq: Once | ORAL | Status: DC | PRN

## 2015-09-03 MED ORDER — LACTATED RINGERS IV SOLN
INTRAVENOUS | Status: DC | PRN
  Administered 2015-09-03: 13:00:00 via INTRAVENOUS

## 2015-09-03 MED ORDER — ROCURONIUM BROMIDE 100 MG/10ML IV SOLN
INTRAVENOUS | Status: DC | PRN
  Administered 2015-09-03: 40 mg via INTRAVENOUS

## 2015-09-03 MED ORDER — PROPOFOL 10 MG/ML IV BOLUS
INTRAVENOUS | Status: DC | PRN
  Administered 2015-09-03: 140 mg via INTRAVENOUS

## 2015-09-03 MED ORDER — MIDAZOLAM HCL 2 MG/2ML IJ SOLN
INTRAMUSCULAR | Status: AC
  Filled 2015-09-03: qty 2

## 2015-09-03 MED ORDER — FENTANYL CITRATE (PF) 100 MCG/2ML IJ SOLN
INTRAMUSCULAR | Status: AC
  Filled 2015-09-03: qty 4

## 2015-09-03 MED ORDER — DEXAMETHASONE SODIUM PHOSPHATE 10 MG/ML IJ SOLN
INTRAMUSCULAR | Status: AC
  Filled 2015-09-03: qty 1

## 2015-09-03 MED ORDER — FENTANYL CITRATE (PF) 100 MCG/2ML IJ SOLN
INTRAMUSCULAR | Status: DC | PRN
  Administered 2015-09-03 (×4): 50 ug via INTRAVENOUS

## 2015-09-03 MED ORDER — MIDAZOLAM HCL 5 MG/5ML IJ SOLN
INTRAMUSCULAR | Status: DC | PRN
  Administered 2015-09-03: 2 mg via INTRAVENOUS

## 2015-09-03 MED ORDER — ONDANSETRON HCL 4 MG/2ML IJ SOLN
INTRAMUSCULAR | Status: DC | PRN
  Administered 2015-09-03: 4 mg via INTRAVENOUS

## 2015-09-03 MED ORDER — ACETAMINOPHEN 325 MG PO TABS: 325.0000 mg | ORAL_TABLET | ORAL | Status: DC | PRN

## 2015-09-03 MED ORDER — SODIUM CHLORIDE 0.9 % IV SOLN
INTRAVENOUS | Status: DC
  Administered 2015-09-03: 12:00:00 via INTRAVENOUS

## 2015-09-03 MED ORDER — LIDOCAINE HCL (CARDIAC) 20 MG/ML IV SOLN
INTRAVENOUS | Status: DC | PRN
  Administered 2015-09-03: 40 mg via INTRAVENOUS

## 2015-09-03 MED ORDER — PROPOFOL 10 MG/ML IV BOLUS
INTRAVENOUS | Status: AC
  Filled 2015-09-03: qty 20

## 2015-09-03 MED ORDER — DEXAMETHASONE SODIUM PHOSPHATE 10 MG/ML IJ SOLN
INTRAMUSCULAR | Status: DC | PRN
  Administered 2015-09-03: 5 mg via INTRAVENOUS

## 2015-09-03 MED ORDER — SUGAMMADEX SODIUM 200 MG/2ML IV SOLN
INTRAVENOUS | Status: DC | PRN
  Administered 2015-09-03: 200 mg via INTRAVENOUS

## 2015-09-03 MED ORDER — PHENYLEPHRINE 40 MCG/ML (10ML) SYRINGE FOR IV PUSH (FOR BLOOD PRESSURE SUPPORT)
PREFILLED_SYRINGE | INTRAVENOUS | Status: AC
  Filled 2015-09-03: qty 10

## 2015-09-03 MED ORDER — ACETAMINOPHEN 160 MG/5ML PO SOLN
325.0000 mg | ORAL | Status: DC | PRN
  Filled 2015-09-03: qty 20.3

## 2015-09-03 MED ORDER — SODIUM CHLORIDE 0.9 % IR SOLN
Status: DC | PRN
  Administered 2015-09-03: 3000 mL

## 2015-09-03 MED ORDER — OXYCODONE HCL 5 MG PO TABS: 5.0000 mg | ORAL_TABLET | Freq: Once | ORAL | Status: DC | PRN

## 2015-09-03 MED ORDER — TRAZODONE HCL 50 MG PO TABS
50.0000 mg | ORAL_TABLET | Freq: Once | ORAL | Status: AC
  Administered 2015-09-03: 50 mg via ORAL
  Filled 2015-09-03: qty 1

## 2015-09-03 MED ORDER — MORPHINE SULFATE (PF) 2 MG/ML IV SOLN
2.0000 mg | INTRAVENOUS | Status: DC | PRN
  Administered 2015-09-03: 2 mg via INTRAVENOUS
  Filled 2015-09-03: qty 1

## 2015-09-03 MED ORDER — HYDROMORPHONE HCL 1 MG/ML IJ SOLN: 0.2500 mg | INTRAMUSCULAR | Status: DC | PRN

## 2015-09-03 MED ORDER — ONDANSETRON HCL 4 MG/2ML IJ SOLN
INTRAMUSCULAR | Status: AC
  Filled 2015-09-03: qty 2

## 2015-09-03 MED ORDER — CEFAZOLIN SODIUM 1 G IJ SOLR
INTRAMUSCULAR | Status: DC | PRN
  Administered 2015-09-03: 1 g via INTRAMUSCULAR

## 2015-09-03 SURGICAL SUPPLY — 49 items
BANDAGE ACE 4X5 VEL STRL LF (GAUZE/BANDAGES/DRESSINGS) ×3 IMPLANT
BANDAGE ACE 6X5 VEL STRL LF (GAUZE/BANDAGES/DRESSINGS) ×3 IMPLANT
BLADE SURG 10 STRL SS (BLADE) ×3 IMPLANT
BNDG COHESIVE 4X5 TAN STRL (GAUZE/BANDAGES/DRESSINGS) ×3 IMPLANT
BNDG GAUZE ELAST 4 BULKY (GAUZE/BANDAGES/DRESSINGS) ×3 IMPLANT
CONT SPEC STER OR (MISCELLANEOUS) ×3 IMPLANT
COVER SURGICAL LIGHT HANDLE (MISCELLANEOUS) ×3 IMPLANT
CUFF TOURNIQUET SINGLE 34IN LL (TOURNIQUET CUFF) IMPLANT
DRAIN PENROSE 1/4X12 LTX STRL (WOUND CARE) ×3 IMPLANT
DRSG ADAPTIC 3X8 NADH LF (GAUZE/BANDAGES/DRESSINGS) ×3 IMPLANT
DURAPREP 26ML APPLICATOR (WOUND CARE) ×3 IMPLANT
ELECT REM PT RETURN 9FT ADLT (ELECTROSURGICAL)
ELECTRODE REM PT RTRN 9FT ADLT (ELECTROSURGICAL) IMPLANT
EVACUATOR 1/8 PVC DRAIN (DRAIN) IMPLANT
FACESHIELD WRAPAROUND (MASK) ×9 IMPLANT
GAUZE SPONGE 4X4 12PLY STRL (GAUZE/BANDAGES/DRESSINGS) ×3 IMPLANT
GAUZE XEROFORM 1X8 LF (GAUZE/BANDAGES/DRESSINGS) ×3 IMPLANT
GLOVE BIO SURGEON STRL SZ7.5 (GLOVE) ×6 IMPLANT
GLOVE BIOGEL PI IND STRL 6.5 (GLOVE) ×2 IMPLANT
GLOVE BIOGEL PI IND STRL 8 (GLOVE) ×2 IMPLANT
GLOVE BIOGEL PI INDICATOR 6.5 (GLOVE) ×4
GLOVE BIOGEL PI INDICATOR 8 (GLOVE) ×4
GLOVE ECLIPSE 6.5 STRL STRAW (GLOVE) ×3 IMPLANT
GOWN STRL REUS W/ TWL LRG LVL3 (GOWN DISPOSABLE) ×3 IMPLANT
GOWN STRL REUS W/TWL LRG LVL3 (GOWN DISPOSABLE) ×6
HANDPIECE INTERPULSE COAX TIP (DISPOSABLE)
KIT BASIN OR (CUSTOM PROCEDURE TRAY) ×3 IMPLANT
KIT ROOM TURNOVER OR (KITS) ×3 IMPLANT
MANIFOLD NEPTUNE II (INSTRUMENTS) ×3 IMPLANT
NS IRRIG 1000ML POUR BTL (IV SOLUTION) ×3 IMPLANT
PACK ORTHO EXTREMITY (CUSTOM PROCEDURE TRAY) ×3 IMPLANT
PAD ABD 8X10 STRL (GAUZE/BANDAGES/DRESSINGS) ×3 IMPLANT
PAD ARMBOARD 7.5X6 YLW CONV (MISCELLANEOUS) ×6 IMPLANT
PENCIL BUTTON HOLSTER BLD 10FT (ELECTRODE) IMPLANT
SET CYSTO W/LG BORE CLAMP LF (SET/KITS/TRAYS/PACK) ×3 IMPLANT
SET HNDPC FAN SPRY TIP SCT (DISPOSABLE) IMPLANT
SPONGE GAUZE 4X4 12PLY STER LF (GAUZE/BANDAGES/DRESSINGS) ×3 IMPLANT
SPONGE LAP 18X18 X RAY DECT (DISPOSABLE) ×3 IMPLANT
STOCKINETTE IMPERVIOUS 9X36 MD (GAUZE/BANDAGES/DRESSINGS) ×3 IMPLANT
SUT ETHILON 3 0 PS 1 (SUTURE) ×3 IMPLANT
TOWEL OR 17X24 6PK STRL BLUE (TOWEL DISPOSABLE) ×3 IMPLANT
TOWEL OR 17X26 10 PK STRL BLUE (TOWEL DISPOSABLE) ×3 IMPLANT
TOWEL OR NON WOVEN STRL DISP B (DISPOSABLE) ×3 IMPLANT
TUBE ANAEROBIC SPECIMEN COL (MISCELLANEOUS) IMPLANT
TUBE CONNECTING 12'X1/4 (SUCTIONS) ×1
TUBE CONNECTING 12X1/4 (SUCTIONS) ×2 IMPLANT
UNDERPAD 30X30 (UNDERPADS AND DIAPERS) ×3 IMPLANT
WATER STERILE IRR 1000ML POUR (IV SOLUTION) ×3 IMPLANT
YANKAUER SUCT BULB TIP NO VENT (SUCTIONS) ×3 IMPLANT

## 2015-09-03 NOTE — Transfer of Care (Signed)
Immediate Anesthesia Transfer of Care Note  Patient: April [name]  Procedure(s) Performed: Procedure(s): IRRIGATION AND DEBRIDEMENT abcess LOWER LEG with complex closure (Right)  Patient Location: PACU  Anesthesia Type:General  Level of Consciousness: awake, alert  and oriented  Airway & Oxygen Therapy: Patient Spontanous Breathing and Patient connected to nasal cannula oxygen  Post-op Assessment: Report given to RN, Post -op Vital signs reviewed and stable and Patient moving all extremities X 4  Post vital signs: Reviewed and stable  Last Vitals:  Vitals:   09/03/15 1343 09/03/15 1345  BP: 90/65   Pulse: 88 89  Resp: 15 17  Temp: 36.6 C     Last Pain:  Vitals:   09/03/15 1343  TempSrc:   PainSc: 10-Worst pain ever      Patients Stated Pain Goal: 4 (09/03/15 1343)  Complications: No apparent anesthesia complications

## 2015-09-03 NOTE — Anesthesia Procedure Notes (Signed)
Procedure Name: Intubation Date/Time: 09/03/2015 12:59 PM Performed by: Little IshikawaMERCER, Carrson Lightcap L Pre-anesthesia Checklist: Patient identified, Emergency Drugs available, Suction available and Patient being monitored Patient Re-evaluated:Patient Re-evaluated prior to inductionOxygen Delivery Method: Circle System Utilized Preoxygenation: Pre-oxygenation with 100% oxygen Intubation Type: IV induction Ventilation: Mask ventilation without difficulty Laryngoscope Size: Mac and 3 Grade View: Grade III Tube type: Oral Tube size: 7.0 mm Number of attempts: 1 Airway Equipment and Method: Stylet and Oral airway Placement Confirmation: ETT inserted through vocal cords under direct vision,  positive ETCO2 and breath sounds checked- equal and bilateral Secured at: 22 cm Tube secured with: Tape Dental Injury: Teeth and Oropharynx as per pre-operative assessment

## 2015-09-03 NOTE — Progress Notes (Signed)
Physical Therapy Cancellation Note   09/03/15 1336  PT Visit Information  Last PT Received On 09/03/15  Reason Eval/Treat Not Completed Patient at procedure or test/unavailable; Pt gone for I&D. PT will check on pt later as time allows.   History of Present Illness Patient is a 40 yo female admitted 08/29/15 with RLE pain, swelling, and redness 2* spider bite. Patient with RLE abscess/cellulitis.  Scheduled for I&D 09/02/15 at 2:00 pm.   PMH:  COPD, CHF, HTN, CKD   Erline LevineKellyn Cyree Chuong, PTA Pager: [phone]

## 2015-09-03 NOTE — Anesthesia Preprocedure Evaluation (Signed)
Anesthesia Evaluation  Patient identified by MRN, date of birth, ID band Patient awake    Reviewed: Allergy & Precautions, NPO status , Patient's Chart, lab work & pertinent test results  History of Anesthesia Complications Negative for: history of anesthetic complications  Airway Mallampati: III  TM Distance: >3 FB Neck ROM: Full    Dental  (+) Teeth Intact   Pulmonary shortness of breath, asthma , sleep apnea , COPD, Current Smoker,    breath sounds clear to auscultation       Cardiovascular hypertension, Pt. on medications and Pt. on home beta blockers +CHF   Rhythm:Regular     Neuro/Psych  Headaches, PSYCHIATRIC DISORDERS Depression    GI/Hepatic negative GI ROS, Neg liver ROS,   Endo/Other    Renal/GU Renal disease     Musculoskeletal   Abdominal   Peds  Hematology  (+) anemia ,   Anesthesia Other Findings   Reproductive/Obstetrics                             Anesthesia Physical Anesthesia Plan  ASA: III  Anesthesia Plan: General   Post-op Pain Management:    Induction: Intravenous  Airway Management Planned: Oral ETT  Additional Equipment: None  Intra-op Plan:   Post-operative Plan: Extubation in OR  Informed Consent: I have reviewed the patients History and Physical, chart, labs and discussed the procedure including the risks, benefits and alternatives for the proposed anesthesia with the patient or authorized representative who has indicated his/her understanding and acceptance.   Dental advisory given  Plan Discussed with: CRNA and Surgeon  Anesthesia Plan Comments:         Anesthesia Quick Evaluation

## 2015-09-03 NOTE — Op Note (Signed)
08/29/2015 - 09/03/2015  12:51 PM  PATIENT:  April [name]    PRE-OPERATIVE DIAGNOSIS:  RIGHT LOWER LEG ABSCESS  POST-OPERATIVE DIAGNOSIS:  Same  PROCEDURE:  IRRIGATION AND DEBRIDEMENT LOWER LEG  SURGEON:  Sydnei Ohaver, Jewel BaizeIMOTHY D, MD  ASSISTANT: Aquilla HackerHenry Martensen, PA-C, he was present and scrubbed throughout the case, critical for completion in a timely fashion, and for retraction, instrumentation, and closure.   ANESTHESIA:   gen  PREOPERATIVE INDICATIONS:  April [name] is a  40 y.o. female with a diagnosis of RIGHT LOWER LEG ABSCESS who failed conservative measures and elected for surgical management.    The risks benefits and alternatives were discussed with the patient preoperatively including but not limited to the risks of infection, bleeding, nerve injury, cardiopulmonary complications, the need for revision surgery, among others, and the patient was willing to proceed.  OPERATIVE IMPLANTS: none  OPERATIVE FINDINGS: purulent fluid  BLOOD LOSS: 40cc  COMPLICATIONS: none  TOURNIQUET TIME: none  OPERATIVE PROCEDURE:  Patient was identified in the preoperative holding area and site was marked by me She was transported to the operating theater and placed on the table in supine position taking care to pad all bony prominences. After a preincinduction time out anesthesia was induced. The right lower extremity was prepped and draped in normal sterile fashion and a pre-incision timeout was performed. She received ancef for preoperative antibiotics.   I made a 5 cm incision centered over her abscess. I incised incompletely drain this abscess. I then performed a thorough debridement this is an excisional debridement of devitalized or necrotic fascia muscle and skin.  I then performed a thorough irrigation with 3cm.  I then performed a complex closure of this wound 5cm.  POST OPERATIVE PLAN: cont abx per primary, WBAT, mobilize for dvt px.

## 2015-09-03 NOTE — Progress Notes (Signed)
Family Medicine Teaching Service Daily Progress Note Intern Pager: [phone]  [name] Medical record number: [id number] Date of birth: Apr 29, 1975 Age: 40 y.o. Gender: female  Primary Care Provider: Jaclyn ShaggyEnobong, Amao, MD Consultants: None Code Status: FULL  Pt Overview and Major Events to Date:  8/24 Admit for RLE cellulitis 8/24 Vancomycin initiated 8/27 Vanc d/c, clindamycin PO started 8/27 Vanc resumed, start Ancef  Assessment and Plan: April [name] is a 40 y.o. female presenting with RLE pain, swelling, and redness. PMH is significant for COPD, CHF, HTN, and CKD.  #Cellulitis to RLE. - still reports tenderness. WBC 8, afebrile, area of erythema improving.  Patient refused to have I&D yesterday around 5 and stated she would rather eat. Was planned for surgery at 6pm. She is rescheduled for today 1pm with Dr.Tim Eulah PontMurphy. -monitor cellulitis borders for spread; outlined on admission -Norco prn for pain -NPO after midnight -IV Vanc and Ancef (Day 2) -if does well, consider transition to oral antibiotics tomorrow and plan for dc home.   #Plan for pregnancy -Urine preg neg - will not adjust teratogenic medications at this time given her many complicated comorbidities.  - she was counseled extensively regarding the risks of pregnancy given he age, heart failure and hypertension diagnoses. She was also counseled regarding the risks of pregnancy while taking her current highly teratogenic medications including lisinopril and topomax - she expressed her understanding and desire to still proceed with conceiving - she will need to be referred to high risk OB for pre conception counseling and medication adjustment  #Elevated D-dimer.  - LE dopplers negative  #Anemia. Hgb 9.9< 8.8  -s/p 1 unit blood on 8/25. -consider po iron supplementation outpatient given she is iron deficient  #CKD. Cr 2.44 this am from 2.43 yesterday likely due to decreased po  intake from being NPO yesterday. -continue to monitor -avoid nephrotoxic medications  #CHF. At baseline. Last Echo 12/2014 showing G2DD and EF of 45-50%. Not symptomatic. Euvolemic.  Follows with cardiology. -continue HF medications -SLIV, PO hydration  #COPD. At baseline. No signs of respiratory distress. -continue daily Qvar -albuterol prn  #HTN, stable -continue home BP regimen to include coreg 25mg  BID, Lasix 40BID, Hydralazine 50mg  BID, lisinopril 20mg  -monitor vitals  FEN/GI: Heart Healthy Diet; SLIV Prophylaxis: Patient has been refusing Lovenox and SCDs. Have explained risks to her.  Disposition: Home pending clinical improvement.  Subjective:  Patient sleepy in bed this am. Reports some continued leg pain and frustrated that I&D did not happen yesterday. Was explained that OR was backed up but she could have had it done at 6pm.  Otherwise no complaints at current time.  Objective: Temp:  [97.4 F (36.3 C)-98.8 F (37.1 C)] 98.8 F (37.1 C) (08/29 0539) Pulse Rate:  [67-77] 67 (08/29 0539) Resp:  [16-20] 16 (08/29 0539) BP: (130-176)/(62-86) 132/62 (08/29 0539) SpO2:  [97 %-100 %] 97 % (08/29 0539)   Physical Exam: General: alert, well-developed, in NAD Lungs: CTAB, normal respiratory effort, no crackles, and no wheezes. Heart: RRR, no M/R/G noted Abdomen: Bowel sounds normal; abdomen soft and nontender.  Msk: no joint swelling, no joint warmth, and no redness over joints. Range of motion normal. Able to move bilateral toes and ankles without significant pain. Extremities: anterior 3-4 diameter marked area of warmth, tenderness, with central papule with no evidence of purulence  Neurologic: AOx3 Psych: Mood and affect are normal  Laboratory:  Recent Labs Lab 08/31/15 0548 09/01/15 0533 09/03/15 0459  WBC 9.0 8.0 5.9  HGB 8.8* 9.4* 9.9*  HCT 29.8* 31.7* 32.5*  PLT 329 374 376    Recent Labs Lab 08/31/15 0548 09/01/15 0533 09/03/15 0459  NA 141  142 138  K 3.9 3.9 3.8  CL 108 110 105  CO2 23 22 23   BUN 17 21* 24*  CREATININE 2.43* 2.33* 2.44*  CALCIUM 8.6* 8.8* 8.7*  GLUCOSE 82 83 101*   Imaging/Diagnostic Tests:  No results found. Freddrick March, MD 09/03/2015, 8:00 AM PGY-1, [facility] Family Medicine FPTS Intern pager: [phone], text pages welcome

## 2015-09-03 NOTE — Consult Note (Signed)
ORTHOPAEDIC CONSULTATION  REQUESTING PHYSICIAN: Leeanne Rio, MD  Chief Complaint: Right leg abscess  HPI: April [name] is a 40 y.o. female who complains of 6 days of R leg pain. She thinks that she had a spider bite there. It was very painful and auto drained in the shower a few days ago. She has gotten a little better with IV abx but still painful. This happened in her arm a while ago.   Past Medical History:  Diagnosis Date  . Anemia   . Asthma   . Cellulitis of right lower extremity 08/29/2015  . CHF (congestive heart failure) ([place])   . Chronic kidney disease (CKD)   . COPD (chronic obstructive pulmonary disease) ([place])   . Depression   . Hypertension   . Migraine    "a few times/month" (08/29/2015)  . On home oxygen therapy    "when needed" (08/29/2015)  . OSA (obstructive sleep apnea)    "don't wear mask" (08/29/2015)   Past Surgical History:  Procedure Laterality Date  . NO PAST SURGERIES     Social History   Social History  . Marital status: Married    Spouse name: N/A  . Number of children: N/A  . Years of education: N/A   Social History Main Topics  . Smoking status: Current Every Day Smoker    Packs/day: 0.60    Years: 20.00    Types: Cigarettes  . Smokeless tobacco: Never Used  . Alcohol use 4.2 oz/week    7 Cans of beer per week  . Drug use:     Types: "Crack" cocaine, Marijuana     Comment: 08/29/2015 "not often"  . Sexual activity: Yes   Other Topics Concern  . None   Social History Narrative  . None   Family History  Problem Relation Age of Onset  . Diabetes Mother   . Hypertension Mother   . Cancer Father   . Diabetes Maternal Grandmother   . Cancer Maternal Grandfather   . Cancer Paternal Grandfather    No Known Allergies Prior to Admission medications   Medication Sig Start Date End Date Taking? Authorizing Provider  albuterol (PROVENTIL HFA;VENTOLIN HFA) 108 (90 BASE) MCG/ACT inhaler Inhale 2 puffs into the  lungs every 4 (four) hours as needed for wheezing or shortness of breath. 10/16/14  Yes Kristen N Ward, DO  albuterol (PROVENTIL) (2.5 MG/3ML) 0.083% nebulizer solution Take 2.5 mg by nebulization every 6 (six) hours as needed for wheezing or shortness of breath.   Yes Historical Provider, MD  beclomethasone (QVAR) 80 MCG/ACT inhaler Inhale 1 puff into the lungs 2 (two) times daily. 10/17/14  Yes Arnoldo Morale, MD  butalbital-acetaminophen-caffeine (FIORICET) 50-325-40 MG tablet Take 1-2 tablets by mouth every 6 (six) hours as needed for headache. 10/16/14 10/16/15 Yes Kristen N Ward, DO  carvedilol (COREG) 25 MG tablet Take 1 tablet (25 mg total) by mouth 2 (two) times daily with a meal. 12/25/14  Yes Arnoldo Morale, MD  furosemide (LASIX) 40 MG tablet Take 1 tablet (40 mg total) by mouth 2 (two) times daily. 12/25/14  Yes Arnoldo Morale, MD  hydrALAZINE (APRESOLINE) 50 MG tablet Take 1 tablet (50 mg total) by mouth 2 (two) times daily. 12/25/14  Yes Arnoldo Morale, MD  labetalol (NORMODYNE) 200 MG tablet Take 200 mg by mouth 2 (two) times daily.   Yes Historical Provider, MD  lisinopril (PRINIVIL,ZESTRIL) 20 MG tablet Take 1 tablet (20 mg total) by mouth daily. 04/05/15  Yes  Shary Decamp, PA-C  oxymetazoline (AFRIN NASAL SPRAY) 0.05 % nasal spray Place 1 spray into right nostril 2 (two) times daily as needed for congestion. 04/05/15  Yes Shary Decamp, PA-C  topiramate (TOPAMAX) 50 MG tablet Take 1 tablet (50 mg total) by mouth 2 (two) times daily. 10/17/14  Yes Arnoldo Morale, MD  clindamycin (CLEOCIN) 150 MG capsule Take 2 capsules (300 mg total) by mouth 4 (four) times daily. 08/29/15   Orpah Greek, MD  ferrous sulfate 325 (65 FE) MG tablet Take 1 tablet (325 mg total) by mouth daily. 08/29/15   Orpah Greek, MD  HYDROcodone-acetaminophen (NORCO/VICODIN) 5-325 MG tablet Take 1-2 tablets by mouth every 4 (four) hours as needed for moderate pain. 08/29/15   Orpah Greek, MD   No results  found.  Positive ROS: All other systems have been reviewed and were otherwise negative with the exception of those mentioned in the HPI and as above.  Labs cbc  Recent Labs  09/01/15 0533 09/03/15 0459  WBC 8.0 5.9  HGB 9.4* 9.9*  HCT 31.7* 32.5*  PLT 374 376    Labs inflam No results for input(s): CRP in the last 72 hours.  Invalid input(s): ESR  Labs coag No results for input(s): INR, PTT in the last 72 hours.  Invalid input(s): PT   Recent Labs  09/01/15 0533 09/03/15 0459  NA 142 138  K 3.9 3.8  CL 110 105  CO2 22 23  GLUCOSE 83 101*  BUN 21* 24*  CREATININE 2.33* 2.44*  CALCIUM 8.8* 8.7*    Physical Exam: Vitals:   09/02/15 2127 09/03/15 0539  BP: 130/72 132/62  Pulse: 77 67  Resp: 16 16  Temp: 97.4 F (36.3 C) 98.8 F (37.1 C)   General: Alert, no acute distress Cardiovascular: No pedal edema Respiratory: No cyanosis, no use of accessory musculature GI: No organomegaly, abdomen is soft and non-tender Skin: No lesions in the area of chief complaint other than those listed below in MSK exam.  Neurologic: Sensation intact distally save for the below mentioned MSK exam Psychiatric: Patient is competent for consent with normal mood and affect Lymphatic: No axillary or cervical lymphadenopathy  MUSCULOSKELETAL:  Right lower extremity she has a anterior lateral abscess with small punctate wound on it there is surrounding erythema there is fluctuance and is very tender. Her compartments are soft she is distally neurovascularly intact Other extremities are atraumatic with painless ROM and NVI.  Assessment: R leg abscess  Plan: OR today for I&D Continue care per primary for medical care and abx.    Renette Butters, MD Cell [phone]   09/03/2015 10:10 AM

## 2015-09-04 ENCOUNTER — Encounter (HOSPITAL_COMMUNITY): Payer: Self-pay | Admitting: Orthopedic Surgery

## 2015-09-04 LAB — CBC
HCT: 35.3 % — ABNORMAL LOW (ref 36.0–46.0)
HEMOGLOBIN: 10.8 g/dL — AB (ref 12.0–15.0)
MCH: 26.5 pg (ref 26.0–34.0)
MCHC: 30.6 g/dL (ref 30.0–36.0)
MCV: 86.7 fL (ref 78.0–100.0)
Platelets: 422 10*3/uL — ABNORMAL HIGH (ref 150–400)
RBC: 4.07 MIL/uL (ref 3.87–5.11)
RDW: 15.5 % (ref 11.5–15.5)
WBC: 8.7 10*3/uL (ref 4.0–10.5)

## 2015-09-04 LAB — BASIC METABOLIC PANEL
ANION GAP: 12 (ref 5–15)
BUN: 27 mg/dL — ABNORMAL HIGH (ref 6–20)
CHLORIDE: 105 mmol/L (ref 101–111)
CO2: 20 mmol/L — ABNORMAL LOW (ref 22–32)
CREATININE: 2.53 mg/dL — AB (ref 0.44–1.00)
Calcium: 9.3 mg/dL (ref 8.9–10.3)
GFR calc non Af Amer: 23 mL/min — ABNORMAL LOW (ref >60)
GFR, EST AFRICAN AMERICAN: 26 mL/min — AB (ref >60)
Glucose, Bld: 152 mg/dL — ABNORMAL HIGH (ref 65–99)
Potassium: 4.2 mmol/L (ref 3.5–5.1)
SODIUM: 137 mmol/L (ref 135–145)

## 2015-09-04 MED ORDER — ZOLPIDEM TARTRATE 5 MG PO TABS: 5.0000 mg | ORAL_TABLET | Freq: Every day | ORAL | Status: DC

## 2015-09-04 MED ORDER — CLINDAMYCIN HCL 300 MG PO CAPS
300.0000 mg | ORAL_CAPSULE | Freq: Three times a day (TID) | ORAL | Status: DC
  Administered 2015-09-04: 300 mg via ORAL
  Filled 2015-09-04: qty 1

## 2015-09-04 MED ORDER — HYDROMORPHONE HCL 1 MG/ML IJ SOLN
1.0000 mg | INTRAMUSCULAR | Status: DC | PRN
  Administered 2015-09-04 (×2): 1 mg via INTRAVENOUS
  Filled 2015-09-04 (×3): qty 1

## 2015-09-04 NOTE — Progress Notes (Signed)
FPTS Interim Progress Note  CTSP because the patient stated she wished to leave the hospital now. She had a penrose drain in place as well as an IV. The nurse was able to remove the IV but when she tried to remove the drain , the patient would not allow her. Per the nurse, the patient had been leaving the floor intermittently throughout the day. There has been concern for illicit drug use via the penrose drain, and a UDS was ordered but unable to be obtained.  The patient was approached with by this provider as well as the unit nurse manager as well as her nurse. She was strongly counseled not to leave with the drain in place. She was counseled about risk of infection, harms if she were to use the drain to inject any substances including loss of the limb or even death. It was explained to her that by leaving, she should would be leaving against medical advice. She expressed her understanding and great desire to leave any way and left with her husband.      Bonney AidAlyssa A Rileyann Florance, MD 09/04/2015, 5:32 PM PGY-3, [facility] pager [phone]

## 2015-09-04 NOTE — Progress Notes (Signed)
Spoke with patient wanting to leave AMA with drain still in place in her leg.  I informed her that the drain needs to be removed due to risk of infection, loss of limb, and/or possible death.  The patient stated that her lawyer advised her not to have the drain remove and to just leave the hospital.  The patient refused to allow staff to remove drain.  RN Alwyn RenJamie Covington was witness to this conversation.   Danne HarborGlodean Divonte Senger, RN 09/04/14 [phone]

## 2015-09-04 NOTE — Progress Notes (Signed)
PT Cancellation Note  Patient Details Name: April CarrowLakricia Edwards-Jones MRN: [id number] DOB: September 10, 1975   Cancelled Treatment:    Reason Eval/Treat Not Completed: Other (comment) (pt outside smoking per RN)Pt outside smoking when tx attempted. PT to assess next session.    Derek MoundKellyn R August Gosser Erick Murin, PTA Pager: [phone]   09/04/2015, 2:37 PM

## 2015-09-04 NOTE — Progress Notes (Signed)
Patient with off unit privileges. Pt stated she would be leaving hospital property at approximately 1530 because her and her husband need to take care of some paperwork with her landlord. I told her she would not be permitted to leave the hospital property. Patient unable to verbalize that she would not leave hospital property. Smita, 5W director notified and Dr. Nelson ChimesAmin notified. Dr. Nelson ChimesAmin stated she would call me back.  Will continue to monitor. Asher Muir- Ailani Governale,RN

## 2015-09-04 NOTE — Plan of Care (Signed)
Problem: Pain Managment: Goal: General experience of comfort will improve Outcome: Progressing Requesting pain meds as needed- and not that often; per pt. po works better than IV- explained why.

## 2015-09-04 NOTE — Anesthesia Postprocedure Evaluation (Signed)
Anesthesia Post Note  Patient: April [name]  Procedure(s) Performed: Procedure(s) (LRB): IRRIGATION AND DEBRIDEMENT abcess LOWER LEG with complex closure (Right)  Patient location during evaluation: PACU Anesthesia Type: General Level of consciousness: awake Pain management: pain level controlled Vital Signs Assessment: post-procedure vital signs reviewed and stable Respiratory status: spontaneous breathing Cardiovascular status: stable Postop Assessment: no signs of nausea or vomiting Anesthetic complications: no    Last Vitals:  Vitals:   09/03/15 2133 09/04/15 0538  BP: 127/64 (!) 144/82  Pulse: 85 83  Resp: 18 18  Temp: 36.7 C 36.6 C    Last Pain:  Vitals:   09/04/15 1021  TempSrc:   PainSc: Asleep                 April [name]

## 2015-09-04 NOTE — Progress Notes (Signed)
   Assessment: 1 Day Post-Op  S/P Procedure(s) (LRB): IRRIGATION AND DEBRIDEMENT abcess LOWER LEG with complex closure (Right) by Dr. Jewel Baizeimothy D. Murphy on 09/03/15  Active Problems:   Cellulitis of right lower extremity   Iron deficiency anemia   Chronic diastolic congestive heart failure (HCC)  Plan: Up with therapy  Continue abx per primary. Remove penrose prior to discharge.    Weight Bearing: Weight Bearing as Tolerated (WBAT) Right leg Dressings: Remove penrose prior to discharge.  Reapply gauze/abd ace wrap.  VTE prophylaxis: per primary, SCDs, ambulation Dispo: per primary.  Follow up with Dr. Wandra Feinstein. Murphy in the office in 1-2 weeks.  Please call with questions.  Subjective: Patient reports pain as moderate.  Going outside to smoke.  Counseled on cessation.   Objective:   VITALS:   Vitals:   09/03/15 1355 09/03/15 1405 09/03/15 2133 09/04/15 0538  BP: 109/65 110/60 127/64 (!) 144/82  Pulse: 81 78 85 83  Resp: 11 12 18 18   Temp:  97.5 F (36.4 C) 98.1 F (36.7 C) 97.8 F (36.6 C)  TempSrc:   Oral Oral  SpO2: 93% 99% 98% 98%  Weight:      Height:        Lab Results  Component Value Date   WBC 8.7 09/04/2015   HGB 10.8 (L) 09/04/2015   HCT 35.3 (L) 09/04/2015   MCV 86.7 09/04/2015   PLT 422 (H) 09/04/2015    Physical Exam General: NAD.  Sitting at edge of bed Dressing C/D/I w/ scant SS drainage.   Distal sensation intact.  NVI.   April KernHenry Calvin Martensen [name] 09/04/2015, 8:24 AM

## 2015-09-04 NOTE — Progress Notes (Signed)
Family Medicine Teaching Service Daily Progress Note Intern Pager: [phone]  [name] Medical record number: [id number] Date of birth: 11-Dec-1975 Age: 40 y.o. Gender: female  Primary Care Provider: Jaclyn Shaggy, MD Consultants: None Code Status: FULL  Pt Overview and Major Events to Date:  8/24 Admit for RLE cellulitis 8/24-8/30 Vancomycin 8/27-8/30 Ancef 8/30 start po Clinda  Assessment and Plan: April [name] is a 40 y.o. female presenting with RLE pain, swelling, and redness. PMH is significant for COPD, CHF, HTN, and CKD.  #Cellulitis to RLE. - pain out of proportion to exam this am. Dressing over I&D site. Wound looks clean, no signs of infection.   -I&D yesterday by Dr. Eulah Pont. Drain placed and needs to be removed. -Ortho recs: continue abx, remove penrose drain prior to d/c. Follow up with Dr. Renaye Rakers in office in 1-2 weeks. -Norco and Dilaudid for pain -D/C Vanc and Ancef -Transition to oral antibiotics. Start Clindamycin 300 mg TID x7days course -plan to dc home 8/31 after drain removal   #Plan for pregnancy -Urine preg neg - will not adjust teratogenic medications at this time given her many complicated comorbidities.  - she was counseled extensively regarding the risks of pregnancy given he age, heart failure and hypertension diagnoses. She was also counseled regarding the risks of pregnancy while taking her current highly teratogenic medications including lisinopril and topomax - she expressed her understanding and desire to still proceed with conceiving - she will need to be referred to high risk OB for pre conception counseling and medication adjustment  #Elevated D-dimer.  - LE dopplers negative  #Anemia. Hgb 9.9< 8.8  -s/p 1 unit blood on 8/25. -consider po iron supplementation outpatient given she is iron deficient  #CKD. Cr 2.53 this am from 2.44 yesterday likely due to decreased po intake from being NPO  yesterday. -continue to monitor -avoid nephrotoxic medications  #CHF. At baseline. Last Echo 12/2014 showing G2DD and EF of 45-50%. Not symptomatic. Euvolemic.  Follows with cardiology. -continue HF medications -SLIV, PO hydration  #COPD. At baseline. No signs of respiratory distress. -continue daily Qvar -albuterol prn  #HTN, stable -continue home BP regimen to include coreg 25mg  BID, Lasix 40BID, Hydralazine 50mg  BID, lisinopril 20mg  -monitor vitals  FEN/GI: Heart Healthy Diet; SLIV Prophylaxis: Patient has been refusing Lovenox and SCDs. Have explained risks to her.  Disposition: Likely home tomorrow.  Subjective:  Patient tearful and walking the halls this am. Reports some continued leg pain and frustrated that she has to stay another day for drain removal. Otherwise no complaints at current time.  Objective: Temp:  [97.5 F (36.4 C)-98.1 F (36.7 C)] 97.8 F (36.6 C) (08/30 0538) Pulse Rate:  [78-89] 83 (08/30 0538) Resp:  [11-18] 18 (08/30 0538) BP: (90-144)/(60-82) 144/82 (08/30 0538) SpO2:  [93 %-100 %] 98 % (08/30 0538)   Physical Exam: General: alert, upset Lungs: CTAB, normal respiratory effort, no crackles, and no wheezes. Heart: RRR, no M/R/G noted Abdomen: Bowel sounds normal; abdomen soft and nontender.  Msk: no joint swelling, no joint warmth, and no redness over joints. Range of motion normal. Able to move bilateral toes and ankles without significant pain. Extremities: anterior 3-4 diameter marked area of warmth, tenderness, with central papule with no evidence of purulence  Neurologic: AOx3 Psych: Mood and affect are normal  Laboratory:  Recent Labs Lab 09/01/15 0533 09/03/15 0459 09/04/15 0500  WBC 8.0 5.9 8.7  HGB 9.4* 9.9* 10.8*  HCT 31.7* 32.5* 35.3*  PLT 374 376  422*    Recent Labs Lab 09/01/15 0533 09/03/15 0459 09/04/15 0500  NA 142 138 137  K 3.9 3.8 4.2  CL 110 105 105  CO2 22 23 20*  BUN 21* 24* 27*  CREATININE 2.33*  2.44* 2.53*  CALCIUM 8.8* 8.7* 9.3  GLUCOSE 83 101* 152*   Imaging/Diagnostic Tests:  No results found. April MarchYashika Makaiyah Schweiger, MD 09/04/2015, 12:28 PM PGY-1, [facility] FPTS Intern pager: [phone], text pages welcome

## 2015-09-04 NOTE — Progress Notes (Signed)
Voice mail message left on Dr. Greig RightMurphy's cell number [phone] notifying him that his patient in 5W11 left with the penrose drain in place, AMA.  Patient unwilling to discuss risks of leaving with drain in place. Patient requested we stop talking to her and just let her leave. Shanon BrowGlodean, RN, Shinita, RN and resident also in the room during this encounter.  Asher Muir- Corisa Montini,RN

## 2015-09-04 NOTE — Progress Notes (Signed)
Per Dr. Nelson ChimesAmin patient no longer has off-unit privileges. Dr. Eulah PontMurphy contacted regarding penrose drain. Order received for nursing to remove drain in the morning. Will continue to monitor. Asher Muir- Parlee Amescua,RN

## 2015-09-04 NOTE — Progress Notes (Signed)
Patient trying to leave the unit multiple time after being explain that she could no longer go off the unit because of multiple concerns. Patient refused to stay and wanted to leave AMA.  Patient IV removed however she has a drain in place and refuses to let staff remove it prior to her leaving. Patient RN, Warehouse managerassistance director and Upper Resident present in the room to have a conversation about risk with leaving with the drain including infection, loss of limb, and even death. Patient stated she understood and request staff not to repeat it again and left the unit with her spouse.

## 2015-09-05 NOTE — Discharge Summary (Signed)
[facility]  Patient name: April CarrowLakricia Edwards-Jones Medical record number: [id number] Date of birth: 1975/10/27 Age: 40 y.o. Gender: female Date of Admission: 08/29/2015  Date of Discharge: 09/04/2015 Admitting Physician: Latrelle DodrillBrittany J McIntyre, MD  Primary Care Provider: Jaclyn ShaggyEnobong, Amao, MD Consultants: Orthopedics  Indication for Hospitalization: RLE Cellulitis  Discharge Diagnoses/Problem List:  Patient Active Problem List   Diagnosis Date Noted  . Iron deficiency anemia   . Chronic diastolic congestive heart failure (HCC)   . Cellulitis of right lower extremity 08/29/2015  . Vitamin D deficiency 01/11/2015  . Fatigue 01/10/2015  . Dyspnea 01/10/2015  . Migraine 10/17/2014  . Chronic kidney disease 10/17/2014  . Asthma 09/04/2014  . Essential hypertension 09/03/2014  . Congestive heart failure (HCC) 09/03/2014   Disposition: Patient left AMA  Discharge Condition: N/A  Discharge Exam: General: alert, upset, not cooperative Lungs: CTAB, normal respiratory effort, no crackles, and no wheezes. Heart: RRR, no M/R/G noted Abdomen: Bowel sounds normal; abdomen soft and nontender.  Msk: no joint swelling, no joint warmth, and no redness over joints. Range of motion normal. Able to move bilateral toes and ankles without significant pain. Extremities: s/p I&D with penrose drain in, surgical site clean with no signs of infection Neurologic: AOx3 Psych: Upset, inappropriate  Brief Hospital Course:  Sloan LeiterLakricia Edwards-Jonesis a 40 y.o.femalepresenting with RLE pain, swelling, and redness. PMH is significant for COPD, CHF, HTN, and CKD.  Patient complaining of erythema, swelling and pain along RLE with pain out of proportion to exam.  Was treated with IV antibiotics for cellulitis.  CT Tib fib showing unlikely necrotizing fasciitis.  Compartment syndrome also considered but low clinical suspicion given ability to move toes and ankles without  pain.  Pain did not subside despite treatment with IV Vancomycin and pain medications.  Ortho consulted and planned to I&D her abscess.  Surgery was delayed due to patient eating despite NPO status.  Patient growing frustrated and tearful at having to wait another day however was informed was going for surgery the next day and she was amenable.  I&D was performed and Orthopedics thought it was best to keep a penrose drain in place to be removed one day later.  Patient grew increasingly upset that she was not told that and stated she wanted to leave the hospital. Patient informed that the drain was in place for her own healing and that it will be removed the next day and she can go home. Per nursing, had been leaving hospital unit for 45 minutes at a time and suspected to be injecting into her IV line. She was denied off-unit priviledges and tried to leave hospital AMA. Despite multiple individuals explaining risks and benefits of leaving with a drain and IV in, she refused to listen and unwilling to discuss anything. Nursing was able to remove the IV line, however patient refused to allow nursing to remove drain and left the unit with her husband with the penrose drain in place.   Issues for Follow Up:  Patient left AMA. No issues to follow up on.  Significant Procedures: I&D Right leg abscess  Significant Labs and Imaging:   Recent Labs Lab 09/01/15 0533 09/03/15 0459 09/04/15 0500  WBC 8.0 5.9 8.7  HGB 9.4* 9.9* 10.8*  HCT 31.7* 32.5* 35.3*  PLT 374 376 422*    Recent Labs Lab 08/30/15 0534 08/31/15 0548 09/01/15 0533 09/03/15 0459 09/04/15 0500  NA 138 141 142 138 137  K 3.9 3.9 3.9 3.8 4.2  CL 108 108 110 105 105  CO2 21* 23 22 23  20*  GLUCOSE 88 82 83 101* 152*  BUN 21* 17 21* 24* 27*  CREATININE 2.23* 2.43* 2.33* 2.44* 2.53*  CALCIUM 8.4* 8.6* 8.8* 8.7* 9.3   Results/Tests Pending at Time of Discharge: None  Discharge Medications:    Medication List    STOP taking  these medications   cephALEXin 500 MG capsule Commonly known as:  KEFLEX   ergocalciferol 50000 units capsule Commonly known as:  VITAMIN D2   guaiFENesin 100 MG/5ML Soln Commonly known as:  ROBITUSSIN   promethazine 25 MG tablet Commonly known as:  PHENERGAN     TAKE these medications   clindamycin 150 MG capsule Commonly known as:  CLEOCIN Take 2 capsules (300 mg total) by mouth 4 (four) times daily.   ferrous sulfate 325 (65 FE) MG tablet Take 1 tablet (325 mg total) by mouth daily.   HYDROcodone-acetaminophen 5-325 MG tablet Commonly known as:  NORCO/VICODIN Take 1-2 tablets by mouth every 4 (four) hours as needed for moderate pain.     ASK your doctor about these medications   albuterol (2.5 MG/3ML) 0.083% nebulizer solution Commonly known as:  PROVENTIL Take 2.5 mg by nebulization every 6 (six) hours as needed for wheezing or shortness of breath.   albuterol 108 (90 Base) MCG/ACT inhaler Commonly known as:  PROVENTIL HFA;VENTOLIN HFA Inhale 2 puffs into the lungs every 4 (four) hours as needed for wheezing or shortness of breath.   beclomethasone 80 MCG/ACT inhaler Commonly known as:  QVAR Inhale 1 puff into the lungs 2 (two) times daily.   butalbital-acetaminophen-caffeine 50-325-40 MG tablet Commonly known as:  FIORICET Take 1-2 tablets by mouth every 6 (six) hours as needed for headache.   carvedilol 25 MG tablet Commonly known as:  COREG Take 1 tablet (25 mg total) by mouth 2 (two) times daily with a meal.   furosemide 40 MG tablet Commonly known as:  LASIX Take 1 tablet (40 mg total) by mouth 2 (two) times daily.   hydrALAZINE 50 MG tablet Commonly known as:  APRESOLINE Take 1 tablet (50 mg total) by mouth 2 (two) times daily.   labetalol 200 MG tablet Commonly known as:  NORMODYNE Take 200 mg by mouth 2 (two) times daily.   lisinopril 20 MG tablet Commonly known as:  PRINIVIL,ZESTRIL Take 1 tablet (20 mg total) by mouth daily.    oxymetazoline 0.05 % nasal spray Commonly known as:  AFRIN NASAL SPRAY Place 1 spray into right nostril 2 (two) times daily as needed for congestion.   topiramate 50 MG tablet Commonly known as:  TOPAMAX Take 1 tablet (50 mg total) by mouth 2 (two) times daily.      Discharge Instructions: Please refer to Patient Instructions section of EMR for full details.  Patient was counseled important signs and symptoms that should prompt return to medical care, changes in medications, dietary instructions, activity restrictions, and follow up appointments.   Follow-Up Appointments: Follow-up Information    Jaclyn Shaggy, MD.   Specialty:  Family Medicine Contact information: [address] [place] [address] [phone]        Sheral Apley, MD Follow up in 2 week(s).   Specialty:  Orthopedic Surgery Contact information: [address] [place] [address] [phone]          Freddrick March, MD 09/05/2015, 9:14 PM PGY-1, [facility]

## 2015-09-07 ENCOUNTER — Encounter (HOSPITAL_COMMUNITY): Payer: Self-pay | Admitting: Emergency Medicine

## 2015-09-07 ENCOUNTER — Emergency Department (HOSPITAL_COMMUNITY)
Admission: EM | Admit: 2015-09-07 | Discharge: 2015-09-07 | Disposition: A | Discharge status: Home / Self Care | Payer: PRIVATE HEALTH INSURANCE | Attending: Emergency Medicine | Admitting: Emergency Medicine

## 2015-09-07 DIAGNOSIS — R600 Localized edema: Secondary | ICD-10-CM | POA: Insufficient documentation

## 2015-09-07 DIAGNOSIS — I5032 Chronic diastolic (congestive) heart failure: Secondary | ICD-10-CM | POA: Diagnosis not present

## 2015-09-07 DIAGNOSIS — N189 Chronic kidney disease, unspecified: Secondary | ICD-10-CM | POA: Insufficient documentation

## 2015-09-07 DIAGNOSIS — Z7951 Long term (current) use of inhaled steroids: Secondary | ICD-10-CM | POA: Diagnosis not present

## 2015-09-07 DIAGNOSIS — J449 Chronic obstructive pulmonary disease, unspecified: Secondary | ICD-10-CM | POA: Diagnosis not present

## 2015-09-07 DIAGNOSIS — I130 Hypertensive heart and chronic kidney disease with heart failure and stage 1 through stage 4 chronic kidney disease, or unspecified chronic kidney disease: Secondary | ICD-10-CM | POA: Diagnosis not present

## 2015-09-07 DIAGNOSIS — Z4801 Encounter for change or removal of surgical wound dressing: Secondary | ICD-10-CM | POA: Insufficient documentation

## 2015-09-07 DIAGNOSIS — J45909 Unspecified asthma, uncomplicated: Secondary | ICD-10-CM | POA: Diagnosis not present

## 2015-09-07 DIAGNOSIS — F17210 Nicotine dependence, cigarettes, uncomplicated: Secondary | ICD-10-CM | POA: Diagnosis not present

## 2015-09-07 DIAGNOSIS — R2241 Localized swelling, mass and lump, right lower limb: Secondary | ICD-10-CM | POA: Diagnosis present

## 2015-09-07 DIAGNOSIS — Z79899 Other long term (current) drug therapy: Secondary | ICD-10-CM | POA: Diagnosis not present

## 2015-09-07 DIAGNOSIS — Z4889 Encounter for other specified surgical aftercare: Secondary | ICD-10-CM

## 2015-09-07 LAB — BASIC METABOLIC PANEL
ANION GAP: 11 (ref 5–15)
BUN: 32 mg/dL — ABNORMAL HIGH (ref 6–20)
CHLORIDE: 107 mmol/L (ref 101–111)
CO2: 19 mmol/L — AB (ref 22–32)
Calcium: 8.9 mg/dL (ref 8.9–10.3)
Creatinine, Ser: 2.71 mg/dL — ABNORMAL HIGH (ref 0.44–1.00)
GFR calc non Af Amer: 21 mL/min — ABNORMAL LOW (ref >60)
GFR, EST AFRICAN AMERICAN: 24 mL/min — AB (ref >60)
Glucose, Bld: 89 mg/dL (ref 65–99)
POTASSIUM: 3.9 mmol/L (ref 3.5–5.1)
Sodium: 137 mmol/L (ref 135–145)

## 2015-09-07 LAB — CBC WITH DIFFERENTIAL/PLATELET
BASOS PCT: 1 %
Basophils Absolute: 0.1 10*3/uL (ref 0.0–0.1)
Eosinophils Absolute: 0.2 10*3/uL (ref 0.0–0.7)
Eosinophils Relative: 2 %
HEMATOCRIT: 33.9 % — AB (ref 36.0–46.0)
HEMOGLOBIN: 10.9 g/dL — AB (ref 12.0–15.0)
LYMPHS ABS: 1.5 10*3/uL (ref 0.7–4.0)
Lymphocytes Relative: 17 %
MCH: 26.7 pg (ref 26.0–34.0)
MCHC: 32.2 g/dL (ref 30.0–36.0)
MCV: 82.9 fL (ref 78.0–100.0)
MONOS PCT: 8 %
Monocytes Absolute: 0.7 10*3/uL (ref 0.1–1.0)
NEUTROS ABS: 6.5 10*3/uL (ref 1.7–7.7)
NEUTROS PCT: 72 %
Platelets: 390 10*3/uL (ref 150–400)
RBC: 4.09 MIL/uL (ref 3.87–5.11)
RDW: 15.2 % (ref 11.5–15.5)
WBC: 9 10*3/uL (ref 4.0–10.5)

## 2015-09-07 LAB — I-STAT BETA HCG BLOOD, ED (MC, WL, AP ONLY): I-stat hCG, quantitative: <5 m[IU]/mL (ref <5)

## 2015-09-07 MED ORDER — CLINDAMYCIN HCL 150 MG PO CAPS: 150.0000 mg | ORAL_CAPSULE | Freq: Four times a day (QID) | ORAL | 0 refills | Status: DC

## 2015-09-07 NOTE — ED Triage Notes (Signed)
Wound care completed on r/lower leg. Reviewed wound care with husband, pt is very sleepy

## 2015-09-07 NOTE — ED Provider Notes (Signed)
[facility]-EMERGENCY DEPT Provider Note   CSN: [id number] Arrival date & time: 09/07/15  [id number]     History   Chief Complaint Chief Complaint  Patient presents with  . Leg Swelling    HPI April [name] is a 40 y.o. female.  40 year old female with past medical history including CK D, COPD, CHF, recent right leg cellulitis who presents with right leg swelling. The patient was admitted to the hospital on 8/24 for IV antibiotics related to a right leg cellulitis. She eventually had an I&D by orthopedics with Penrose drain placed. She left AMA on 8/30; during her hospitalization there was a concern for possible illicit drug use while in the hospital and patient states that she left prior to receiving any discharge medications. She reports ongoing swelling since discharge, mostly just distal to her right knee. She has changed the bandage once since leaving. She took the Penrose drain out herself on 8/31. She denies any change in the drainage. She reports ongoing, constant, severe pain in her leg. She has felt hot a few times but has not measured her temperature. She has had good oral intake since discharge and no vomiting, abdominal pain, or other new symptoms. Patient states that she has been walking a lot and husband notes that she has probably been walking too much without keeping her leg elevated.   The history is provided by the patient.    Past Medical History:  Diagnosis Date  . Anemia   . Asthma   . Cellulitis of right lower extremity 08/29/2015  . CHF (congestive heart failure) (HCC)   . Chronic kidney disease (CKD)   . COPD (chronic obstructive pulmonary disease) (HCC)   . Depression   . Hypertension   . Migraine    "a few times/month" (08/29/2015)  . On home oxygen therapy    "when needed" (08/29/2015)  . OSA (obstructive sleep apnea)    "don't wear mask" (08/29/2015)    Patient Active Problem List   Diagnosis Date Noted  . Iron deficiency anemia   . Chronic diastolic  congestive heart failure (HCC)   . Cellulitis of right lower extremity 08/29/2015  . Vitamin D deficiency 01/11/2015  . Fatigue 01/10/2015  . Dyspnea 01/10/2015  . Migraine 10/17/2014  . Chronic kidney disease 10/17/2014  . Asthma 09/04/2014  . Essential hypertension 09/03/2014  . Congestive heart failure (HCC) 09/03/2014    Past Surgical History:  Procedure Laterality Date  . I&D EXTREMITY Right 09/03/2015   Procedure: IRRIGATION AND DEBRIDEMENT abcess LOWER LEG with complex closure;  Surgeon: Sheral Apleyimothy D Murphy, MD;  Location: [facility] OR;  Service: Orthopedics;  Laterality: Right;  . NO PAST SURGERIES      OB History    No data available       Home Medications    Prior to Admission medications   Medication Sig Start Date End Date Taking? Authorizing Provider  albuterol (PROVENTIL HFA;VENTOLIN HFA) 108 (90 BASE) MCG/ACT inhaler Inhale 2 puffs into the lungs every 4 (four) hours as needed for wheezing or shortness of breath. 10/16/14  Yes Kristen N Ward, DO  albuterol (PROVENTIL) (2.5 MG/3ML) 0.083% nebulizer solution Take 2.5 mg by nebulization every 6 (six) hours as needed for wheezing or shortness of breath.   Yes Historical Provider, MD  beclomethasone (QVAR) 80 MCG/ACT inhaler Inhale 1 puff into the lungs 2 (two) times daily. 10/17/14  Yes Jaclyn ShaggyEnobong Amao, MD  butalbital-acetaminophen-caffeine (FIORICET) 50-325-40 MG tablet Take 1-2 tablets by mouth every 6 (six) hours as needed  for headache. 10/16/14 10/16/15 Yes Kristen N Ward, DO  carvedilol (COREG) 25 MG tablet Take 1 tablet (25 mg total) by mouth 2 (two) times daily with a meal. 12/25/14  Yes Jaclyn Shaggy, MD  ferrous sulfate 325 (65 FE) MG tablet Take 1 tablet (325 mg total) by mouth daily. 08/29/15  Yes Gilda Crease, MD  furosemide (LASIX) 40 MG tablet Take 1 tablet (40 mg total) by mouth 2 (two) times daily. 12/25/14  Yes Jaclyn Shaggy, MD  hydrALAZINE (APRESOLINE) 50 MG tablet Take 1 tablet (50 mg total) by mouth 2 (two)  times daily. 12/25/14  Yes Jaclyn Shaggy, MD  labetalol (NORMODYNE) 200 MG tablet Take 200 mg by mouth 2 (two) times daily.   Yes Historical Provider, MD  lisinopril (PRINIVIL,ZESTRIL) 20 MG tablet Take 1 tablet (20 mg total) by mouth daily. 04/05/15  Yes Audry Pili, PA-C  oxymetazoline (AFRIN NASAL SPRAY) 0.05 % nasal spray Place 1 spray into right nostril 2 (two) times daily as needed for congestion. 04/05/15  Yes Audry Pili, PA-C  topiramate (TOPAMAX) 50 MG tablet Take 1 tablet (50 mg total) by mouth 2 (two) times daily. 10/17/14  Yes Jaclyn Shaggy, MD  clindamycin (CLEOCIN) 150 MG capsule Take 1 capsule (150 mg total) by mouth 4 (four) times daily. 09/07/15   Laurence Spates, MD    Family History Family History  Problem Relation Age of Onset  . Diabetes Mother   . Hypertension Mother   . Cancer Father   . Diabetes Maternal Grandmother   . Cancer Maternal Grandfather   . Cancer Paternal Grandfather     Social History Social History  Substance Use Topics  . Smoking status: Current Every Day Smoker    Packs/day: 0.60    Years: 20.00    Types: Cigarettes  . Smokeless tobacco: Never Used  . Alcohol use 4.2 oz/week    7 Cans of beer per week     Allergies   Review of patient's allergies indicates no known allergies.   Review of Systems Review of Systems 10 Systems reviewed and are negative for acute change except as noted in the HPI.   Physical Exam Updated Vital Signs BP 158/81 (BP Location: Left Arm)   Pulse 94   Temp 98.1 F (36.7 C) (Oral)   Resp 12   LMP 08/10/2015 (Exact Date)   SpO2 99%   Physical Exam  Constitutional: She is oriented to person, place, and time. She appears well-developed and well-nourished. No distress.  HENT:  Head: Normocephalic and atraumatic.  Moist mucous membranes  Eyes: Conjunctivae are normal. Pupils are equal, round, and reactive to light.  Neck: Neck supple.  Cardiovascular: Normal rate, regular rhythm and normal heart sounds.    No murmur heard. Pulmonary/Chest: Effort normal and breath sounds normal.  Abdominal: Soft. Bowel sounds are normal. She exhibits no distension. There is no tenderness.  Musculoskeletal: She exhibits no edema.  Exam difficult due to patient not letting me touch leg; mild edema immediately surrounding surgical incision on R lower leg just distal to knee; trace foot edema; 2+ pedal pulses; normal ROM at knee; compartments soft  Neurological: She is alert and oriented to person, place, and time.  Fluent speech  Skin: Skin is warm and dry.  No active drainage at incision site, which has 3 sutures in place and 1 small open area; no surrounding erythema; mild edema immediately surrounding incision site; no crepitus  Psychiatric:  Bizarre affect  Nursing note and vitals reviewed.  ED Treatments / Results  Labs (all labs ordered are listed, but only abnormal results are displayed) Labs Reviewed  BASIC METABOLIC PANEL - Abnormal; Notable for the following:       Result Value   CO2 19 (*)    BUN 32 (*)    Creatinine, Ser 2.71 (*)    GFR calc non Af Amer 21 (*)    GFR calc Af Amer 24 (*)    All other components within normal limits  CBC WITH DIFFERENTIAL/PLATELET - Abnormal; Notable for the following:    Hemoglobin 10.9 (*)    HCT 33.9 (*)    All other components within normal limits  I-STAT BETA HCG BLOOD, ED ([facility], [facility], [facility] ONLY)    EKG  EKG Interpretation None       Radiology No results found.  Procedures Procedures (including critical care time)  Medications Ordered in ED Medications - No data to display   Initial Impression / Assessment and Plan / ED Course  I have reviewed the triage vital signs and the nursing notes.  Pertinent labs that were available during my care of the patient were reviewed by me and considered in my medical decision making (see chart for details).  Clinical Course    Pt recently Hospitalized and left AMA with Penrose drain still in place  after incision and drainage of right lower leg infection presents for reevaluation due to concerns for ongoing swelling at her incision site. She was awake and alert, comfortable on exam. Vital signs notable for hypertension, patient was afebrile. She would not allow me to examine leg and therefore examination was very difficult but I did not appreciate any significant redness or warmth of her leg, mild swelling immediately around incision site with no active drainage from wound. Sutures in place. Obtained basic lab work to evaluate WBC count and patient's creatinine given her underlying CK D. The patient states that she has been continuing her Keflex prescription which was given prior to her hospitalization but she did not receive the medications she was discharged with, including clindamycin.   Labs show reassuring CBC, creatinine 2.7, BUN 32 which is slightly worse than day of discharge. Pt has been afebrile and her wound appears to be healing appropriately. I have instructed the patient and husband to hold her Lasix medication for 1 day and then resume usual dosing of this medication as well as her chronic medications. Emphasized the importance of compliance with medications as the patient is at risk for worsening kidney disease and eventual dialysis if she continues to have uncontrolled hypertension. Instructed to discontinue Keflex and provided clindamycin prescription instead. Emphasized importance of follow-up with orthopedic surgeon for suture removal as well as PCP for ongoing kidney and blood pressure problems. Return precautions reviewed. Patient discharged in satisfactory condition. Final Clinical Impressions(s) / ED Diagnoses   Final diagnoses:  Encounter for post surgical wound check  Leg edema, right  CKD (chronic kidney disease), unspecified stage    New Prescriptions New Prescriptions   CLINDAMYCIN (CLEOCIN) 150 MG CAPSULE    Take 1 capsule (150 mg total) by mouth 4 (four) times  daily.     Laurence Spates, MD 09/07/15 1120

## 2015-09-07 NOTE — Discharge Instructions (Signed)
Keep your right leg elevated as much as possible. Change the bandages at least once daily and keep wound clean and dry. It is important for you to follow-up with the surgeon in 5-7 days for suture removal. Please call to schedule an appointment. Your kidney function is slightly worse today-- skip your lasix dose for 1 day and then resume your home medication schedule. Take the clindamycin INSTEAD of keflex until the pills are finished. Return if you have any fever, worsening redness, or worsening drainage from your wound.

## 2015-09-07 NOTE — ED Triage Notes (Signed)
Pt was bit by brown recluse 1.5 weeks ago. [facility] and had wound drained. Pt reports swelling has gotten worse since then.

## 2015-09-08 LAB — AEROBIC/ANAEROBIC CULTURE W GRAM STAIN (SURGICAL/DEEP WOUND)

## 2015-09-08 LAB — AEROBIC/ANAEROBIC CULTURE (SURGICAL/DEEP WOUND)

## 2015-10-16 ENCOUNTER — Encounter (HOSPITAL_COMMUNITY): Payer: Self-pay

## 2015-10-16 ENCOUNTER — Emergency Department (HOSPITAL_COMMUNITY): Payer: Self-pay

## 2015-10-16 ENCOUNTER — Inpatient Hospital Stay (HOSPITAL_COMMUNITY)
Admission: EM | Admit: 2015-10-16 | Discharge: 2015-10-16 | DRG: 291 | Discharge status: Against Medical Advice | Payer: Self-pay | Attending: Internal Medicine | Admitting: Internal Medicine

## 2015-10-16 DIAGNOSIS — I130 Hypertensive heart and chronic kidney disease with heart failure and stage 1 through stage 4 chronic kidney disease, or unspecified chronic kidney disease: Principal | ICD-10-CM | POA: Diagnosis present

## 2015-10-16 DIAGNOSIS — I5020 Unspecified systolic (congestive) heart failure: Secondary | ICD-10-CM

## 2015-10-16 DIAGNOSIS — J9601 Acute respiratory failure with hypoxia: Secondary | ICD-10-CM | POA: Diagnosis present

## 2015-10-16 DIAGNOSIS — N184 Chronic kidney disease, stage 4 (severe): Secondary | ICD-10-CM | POA: Diagnosis present

## 2015-10-16 DIAGNOSIS — G4733 Obstructive sleep apnea (adult) (pediatric): Secondary | ICD-10-CM | POA: Diagnosis present

## 2015-10-16 DIAGNOSIS — Z9981 Dependence on supplemental oxygen: Secondary | ICD-10-CM

## 2015-10-16 DIAGNOSIS — Z79899 Other long term (current) drug therapy: Secondary | ICD-10-CM

## 2015-10-16 DIAGNOSIS — I5043 Acute on chronic combined systolic (congestive) and diastolic (congestive) heart failure: Secondary | ICD-10-CM | POA: Diagnosis present

## 2015-10-16 DIAGNOSIS — J441 Chronic obstructive pulmonary disease with (acute) exacerbation: Secondary | ICD-10-CM | POA: Diagnosis present

## 2015-10-16 DIAGNOSIS — Z833 Family history of diabetes mellitus: Secondary | ICD-10-CM

## 2015-10-16 DIAGNOSIS — Z8249 Family history of ischemic heart disease and other diseases of the circulatory system: Secondary | ICD-10-CM

## 2015-10-16 DIAGNOSIS — I509 Heart failure, unspecified: Secondary | ICD-10-CM

## 2015-10-16 DIAGNOSIS — I160 Hypertensive urgency: Secondary | ICD-10-CM | POA: Diagnosis present

## 2015-10-16 DIAGNOSIS — J449 Chronic obstructive pulmonary disease, unspecified: Secondary | ICD-10-CM | POA: Diagnosis present

## 2015-10-16 DIAGNOSIS — D509 Iron deficiency anemia, unspecified: Secondary | ICD-10-CM | POA: Diagnosis present

## 2015-10-16 LAB — BASIC METABOLIC PANEL
ANION GAP: 8 (ref 5–15)
BUN: 28 mg/dL — ABNORMAL HIGH (ref 6–20)
CALCIUM: 8.5 mg/dL — AB (ref 8.9–10.3)
CO2: 20 mmol/L — AB (ref 22–32)
Chloride: 111 mmol/L (ref 101–111)
Creatinine, Ser: 2.88 mg/dL — ABNORMAL HIGH (ref 0.44–1.00)
GFR, EST AFRICAN AMERICAN: 22 mL/min — AB (ref >60)
GFR, EST NON AFRICAN AMERICAN: 19 mL/min — AB (ref >60)
GLUCOSE: 92 mg/dL (ref 65–99)
POTASSIUM: 3.6 mmol/L (ref 3.5–5.1)
Sodium: 139 mmol/L (ref 135–145)

## 2015-10-16 LAB — CBC WITH DIFFERENTIAL/PLATELET
BASOS ABS: 0 10*3/uL (ref 0.0–0.1)
BASOS PCT: 1 %
Eosinophils Absolute: 0.4 10*3/uL (ref 0.0–0.7)
Eosinophils Relative: 6 %
HEMATOCRIT: 26.9 % — AB (ref 36.0–46.0)
HEMOGLOBIN: 8.5 g/dL — AB (ref 12.0–15.0)
LYMPHS PCT: 19 %
Lymphs Abs: 1.2 10*3/uL (ref 0.7–4.0)
MCH: 27.6 pg (ref 26.0–34.0)
MCHC: 31.6 g/dL (ref 30.0–36.0)
MCV: 87.3 fL (ref 78.0–100.0)
MONO ABS: 0.4 10*3/uL (ref 0.1–1.0)
Monocytes Relative: 6 %
NEUTROS ABS: 4.3 10*3/uL (ref 1.7–7.7)
NEUTROS PCT: 68 %
Platelets: 304 10*3/uL (ref 150–400)
RBC: 3.08 MIL/uL — ABNORMAL LOW (ref 3.87–5.11)
RDW: 18.3 % — AB (ref 11.5–15.5)
WBC: 6.2 10*3/uL (ref 4.0–10.5)

## 2015-10-16 LAB — BRAIN NATRIURETIC PEPTIDE: B Natriuretic Peptide: 2003.1 pg/mL — ABNORMAL HIGH (ref 0.0–100.0)

## 2015-10-16 LAB — I-STAT TROPONIN, ED: TROPONIN I, POC: 0.05 ng/mL (ref 0.00–0.08)

## 2015-10-16 LAB — TROPONIN I: Troponin I: 0.14 ng/mL (ref <0.03)

## 2015-10-16 MED ORDER — SODIUM CHLORIDE 0.9 % IV SOLN: 250.0000 mL | INTRAVENOUS | Status: DC | PRN

## 2015-10-16 MED ORDER — NICOTINE 21 MG/24HR TD PT24: 21.0000 mg | MEDICATED_PATCH | Freq: Every day | TRANSDERMAL | Status: DC

## 2015-10-16 MED ORDER — ALBUTEROL SULFATE (2.5 MG/3ML) 0.083% IN NEBU
5.0000 mg | INHALATION_SOLUTION | Freq: Once | RESPIRATORY_TRACT | Status: AC
  Administered 2015-10-16: 5 mg via RESPIRATORY_TRACT
  Filled 2015-10-16: qty 6

## 2015-10-16 MED ORDER — IPRATROPIUM-ALBUTEROL 0.5-2.5 (3) MG/3ML IN SOLN: 3.0000 mL | Freq: Four times a day (QID) | RESPIRATORY_TRACT | Status: DC

## 2015-10-16 MED ORDER — SODIUM CHLORIDE 0.9% FLUSH: 3.0000 mL | Freq: Two times a day (BID) | INTRAVENOUS | Status: DC

## 2015-10-16 MED ORDER — BECLOMETHASONE DIPROPIONATE 80 MCG/ACT IN AERS: 1.0000 | INHALATION_SPRAY | Freq: Two times a day (BID) | RESPIRATORY_TRACT | Status: DC

## 2015-10-16 MED ORDER — METHYLPREDNISOLONE SODIUM SUCC 125 MG IJ SOLR
125.0000 mg | Freq: Once | INTRAMUSCULAR | Status: AC
  Administered 2015-10-16: 125 mg via INTRAVENOUS
  Filled 2015-10-16: qty 2

## 2015-10-16 MED ORDER — BUDESONIDE 0.25 MG/2ML IN SUSP: 0.2500 mg | Freq: Two times a day (BID) | RESPIRATORY_TRACT | Status: DC

## 2015-10-16 MED ORDER — ACETAMINOPHEN 325 MG PO TABS: 650.0000 mg | ORAL_TABLET | ORAL | Status: DC | PRN

## 2015-10-16 MED ORDER — ALBUTEROL (5 MG/ML) CONTINUOUS INHALATION SOLN
10.0000 mg/h | INHALATION_SOLUTION | Freq: Once | RESPIRATORY_TRACT | Status: AC
  Administered 2015-10-16: 10 mg/h via RESPIRATORY_TRACT
  Filled 2015-10-16: qty 20

## 2015-10-16 MED ORDER — CARVEDILOL 25 MG PO TABS: 25.0000 mg | ORAL_TABLET | Freq: Two times a day (BID) | ORAL | Status: DC

## 2015-10-16 MED ORDER — HYDRALAZINE HCL 20 MG/ML IJ SOLN
10.0000 mg | INTRAMUSCULAR | Status: DC | PRN
  Administered 2015-10-16: 10 mg via INTRAVENOUS
  Filled 2015-10-16: qty 1

## 2015-10-16 MED ORDER — TOPIRAMATE 25 MG PO TABS: 50.0000 mg | ORAL_TABLET | Freq: Two times a day (BID) | ORAL | Status: DC

## 2015-10-16 MED ORDER — FUROSEMIDE 10 MG/ML IJ SOLN: 40.0000 mg | Freq: Two times a day (BID) | INTRAMUSCULAR | Status: DC

## 2015-10-16 MED ORDER — FUROSEMIDE 10 MG/ML IJ SOLN
20.0000 mg | Freq: Once | INTRAMUSCULAR | Status: AC
  Administered 2015-10-16: 20 mg via INTRAVENOUS
  Filled 2015-10-16: qty 4

## 2015-10-16 MED ORDER — METHYLPREDNISOLONE SODIUM SUCC 125 MG IJ SOLR: 60.0000 mg | Freq: Two times a day (BID) | INTRAMUSCULAR | Status: DC

## 2015-10-16 MED ORDER — HYDRALAZINE HCL 50 MG PO TABS: 100.0000 mg | ORAL_TABLET | Freq: Two times a day (BID) | ORAL | Status: DC

## 2015-10-16 MED ORDER — SODIUM CHLORIDE 0.9% FLUSH: 3.0000 mL | INTRAVENOUS | Status: DC | PRN

## 2015-10-16 MED ORDER — NITROGLYCERIN 0.4 MG SL SUBL
0.4000 mg | SUBLINGUAL_TABLET | SUBLINGUAL | Status: DC | PRN
  Administered 2015-10-16: 0.4 mg via SUBLINGUAL
  Filled 2015-10-16: qty 1

## 2015-10-16 MED ORDER — ONDANSETRON HCL 4 MG/2ML IJ SOLN: 4.0000 mg | Freq: Four times a day (QID) | INTRAMUSCULAR | Status: DC | PRN

## 2015-10-16 MED ORDER — SODIUM CHLORIDE 0.9 % IV SOLN
INTRAVENOUS | Status: DC
  Administered 2015-10-16: 15:00:00 via INTRAVENOUS

## 2015-10-16 MED ORDER — FERROUS SULFATE 325 (65 FE) MG PO TABS: 325.0000 mg | ORAL_TABLET | Freq: Every day | ORAL | Status: DC

## 2015-10-16 MED ORDER — CARVEDILOL 25 MG PO TABS
25.0000 mg | ORAL_TABLET | Freq: Two times a day (BID) | ORAL | Status: DC
  Filled 2015-10-16 (×2): qty 1

## 2015-10-16 MED ORDER — MAGNESIUM SULFATE 2 GM/50ML IV SOLN
2.0000 g | Freq: Once | INTRAVENOUS | Status: AC
  Administered 2015-10-16: 2 g via INTRAVENOUS
  Filled 2015-10-16: qty 50

## 2015-10-16 MED ORDER — ENOXAPARIN SODIUM 30 MG/0.3ML ~~LOC~~ SOLN: 30.0000 mg | SUBCUTANEOUS | Status: DC

## 2015-10-16 NOTE — Progress Notes (Signed)
Patient is A/Ox4 and was verbally communicating that she wanted to leave AMA. She was becoming increasingly agitated and irritable. She said that she wanted to "smoke cigarettes and eat". She was informed about the tobacco-free policy and was offered a Nicotine patch, however she refused.  Food was offered to patient that was available on the unit but she refused. She  is on a heart healthy diet. She stated, "My husband was on his way back to the hospital to bring me back some food". Encouraged patient to stay due to her elevated systolic blood pressure higher than 200 and diastolic greater than 100. She stated " My blood pressure has been this high ever since I was 12". Patient removed all of her telemetry, BP, and oxygen saturation monitoring. On call provider for [facility] notified of patient leaving AMA and her elevated BP. Patient was informed of her risk of death if she was to leave AMA. She was still insistent on leaving AMA. AMA paper was signed by patient. IV was taken out. Offered to wheelchair patient down to the lobby so that she could wait for her husband to arrive. Patient declined wheelchair transfer, she said that her husband was already waiting outside. She and walked off the unit to the elevators while speaking to her husband on her cell phone. Lab called a critical lab value for troponin however patient had already left AMA. On call provider with [facility] was paged and notified of lab result.

## 2015-10-16 NOTE — H&P (Signed)
History and Physical    April [name] YQM:[id number] DOB: September 02, 1975 DOA: 10/16/2015  PCP: Jaclyn Shaggy, MD  Patient coming from: [facility].   Chief Complaint: shortness of breath since 3 days.   HPI: April [name] is a 40 y.o. female with medical history significant of chronic CHF, both systolic and diastolic, stage 4 CKD,  COPD, depression, hypertension, migraines, OSA, NOT ON CPAP, iron deficiency anemia, asthma, presents today for worsening sob since 2 days. On arrival to ED, she was found to be wheezing diffusely,in hypertensive urgency and CXR revealed worsening cardiomegaly, central venous congestion and interstitial edema. She is currently requiring 2 lit of [place] oxygen. She denies chest pain , tightness, orthopnea, syncopal episodes, nausea, vomiting, abdominal pain, diarrhea, or burning urination.  She denies any fevers or chills. She also reports missing her medications, due to financial reasons. She was referred to medical service for admission.      Review of Systems: As per HPI otherwise 10 point review of systems reviewed and are negative.    Past Medical History:  Diagnosis Date  . Anemia   . Asthma   . Cellulitis of right lower extremity 08/29/2015  . CHF (congestive heart failure) (HCC)   . Chronic kidney disease (CKD)   . COPD (chronic obstructive pulmonary disease) (HCC)   . Depression   . Hypertension   . Migraine    "a few times/month" (08/29/2015)  . On home oxygen therapy    "when needed" (08/29/2015)  . OSA (obstructive sleep apnea)    "don't wear mask" (08/29/2015)    Past Surgical History:  Procedure Laterality Date  . I&D EXTREMITY Right 09/03/2015   Procedure: IRRIGATION AND DEBRIDEMENT abcess LOWER LEG with complex closure;  Surgeon: Sheral Apley, MD;  Location: [facility] OR;  Service: Orthopedics;  Laterality: Right;  . NO PAST SURGERIES       reports that she has been smoking Cigarettes.  She has a 12.00 pack-year smoking history. She  has never used smokeless tobacco. She reports that she drinks about 1.8 oz of alcohol per week . She reports that she uses drugs, including "Crack" cocaine and Marijuana.  No Known Allergies  Family History  Problem Relation Age of Onset  . Diabetes Mother   . Hypertension Mother   . Cancer Father   . Diabetes Maternal Grandmother   . Cancer Maternal Grandfather   . Cancer Paternal Grandfather     Acceptable: Family history reviewed and not pertinent  Prior to Admission medications   Medication Sig Start Date End Date Taking? Authorizing Provider  albuterol (PROVENTIL HFA;VENTOLIN HFA) 108 (90 BASE) MCG/ACT inhaler Inhale 2 puffs into the lungs every 4 (four) hours as needed for wheezing or shortness of breath. 10/16/14  Yes Kristen N Ward, DO  albuterol (PROVENTIL) (2.5 MG/3ML) 0.083% nebulizer solution Take 2.5 mg by nebulization every 6 (six) hours as needed for wheezing or shortness of breath.   Yes Historical Provider, MD  beclomethasone (QVAR) 80 MCG/ACT inhaler Inhale 1 puff into the lungs 2 (two) times daily. 10/17/14  Yes Jaclyn Shaggy, MD  butalbital-acetaminophen-caffeine (FIORICET) 50-325-40 MG tablet Take 1-2 tablets by mouth every 6 (six) hours as needed for headache. 10/16/14 10/16/15 Yes Kristen N Ward, DO  carvedilol (COREG) 25 MG tablet Take 1 tablet (25 mg total) by mouth 2 (two) times daily with a meal. 12/25/14  Yes Jaclyn Shaggy, MD  ferrous sulfate 325 (65 FE) MG tablet Take 1 tablet (325 mg total) by mouth daily. 08/29/15  Yes  Gilda Creasehristopher J Pollina, MD  furosemide (LASIX) 40 MG tablet Take 1 tablet (40 mg total) by mouth 2 (two) times daily. Patient taking differently: Take 40 mg by mouth every morning.  12/25/14  Yes Jaclyn ShaggyEnobong Amao, MD  hydrALAZINE (APRESOLINE) 50 MG tablet Take 1 tablet (50 mg total) by mouth 2 (two) times daily. 12/25/14  Yes Jaclyn ShaggyEnobong Amao, MD  lisinopril (PRINIVIL,ZESTRIL) 20 MG tablet Take 1 tablet (20 mg total) by mouth daily. 04/05/15  Yes Audry Piliyler  Mohr, PA-C  oxymetazoline (AFRIN NASAL SPRAY) 0.05 % nasal spray Place 1 spray into right nostril 2 (two) times daily as needed for congestion. 04/05/15  Yes Audry Piliyler Mohr, PA-C  topiramate (TOPAMAX) 50 MG tablet Take 1 tablet (50 mg total) by mouth 2 (two) times daily. 10/17/14  Yes Jaclyn ShaggyEnobong Amao, MD    Physical Exam: Vitals:   10/16/15 1446 10/16/15 1500 10/16/15 1538 10/16/15 1630  BP: (!) 199/125 147/87 (!) 180/123 (!) 208/134  Pulse: 93 89 101   Resp: 15 16 18 15   Temp:      TempSrc:      SpO2: 100% 100% 100%   Weight:      Height:          Constitutional: NAD, calm, comfortable Vitals:   10/16/15 1446 10/16/15 1500 10/16/15 1538 10/16/15 1630  BP: (!) 199/125 147/87 (!) 180/123 (!) 208/134  Pulse: 93 89 101   Resp: 15 16 18 15   Temp:      TempSrc:      SpO2: 100% 100% 100%   Weight:      Height:       Eyes: PERRL, lids and conjunctivae normal ENMT: Mucous membranes are moist. Posterior pharynx clear of any exudate or lesions.Normal dentition.  Neck: normal, supple, no masses, no thyromegaly Respiratory: clear to auscultation bilaterally, no wheezing, no crackles. Normal respiratory effort. No accessory muscle use.  Cardiovascular: Regular rate and rhythm, no murmurs / rubs / gallops. No extremity edema. 2+ pedal pulses. No carotid bruits.  Abdomen: no tenderness, no masses palpated. No hepatosplenomegaly. Bowel sounds positive.  Musculoskeletal: no clubbing / cyanosis. No joint deformity upper and lower extremities. Good ROM, no contractures. Normal muscle tone. Bakers cyst in the right popliteal .  Skin: no rashes, lesions, ulcers. No induration Neurologic: CN 2-12 grossly intact. Sensation intact, DTR normal. Strength 5/5 in [name] 4.  Psychiatric: anxious  Alert and oriented x 3.     Labs on Admission: I have personally reviewed following labs and imaging studies  CBC:  Recent Labs Lab 10/16/15 1439  WBC 6.2  NEUTROABS 4.3  HGB 8.5*  HCT 26.9*  MCV 87.3  PLT  304   Basic Metabolic Panel:  Recent Labs Lab 10/16/15 1439  NA 139  K 3.6  CL 111  CO2 20*  GLUCOSE 92  BUN 28*  CREATININE 2.88*  CALCIUM 8.5*   GFR: Estimated Creatinine Clearance: 26.9 mL/min (by C-G formula based on SCr of 2.88 mg/dL (H)). Liver Function Tests: No results for input(s): AST, ALT, ALKPHOS, BILITOT, PROT, ALBUMIN in the last 168 hours. No results for input(s): LIPASE, AMYLASE in the last 168 hours. No results for input(s): AMMONIA in the last 168 hours. Coagulation Profile: No results for input(s): INR, PROTIME in the last 168 hours. Cardiac Enzymes: No results for input(s): CKTOTAL, CKMB, CKMBINDEX, TROPONINI in the last 168 hours. BNP (last 3 results) No results for input(s): PROBNP in the last 8760 hours. HbA1C: No results for input(s): HGBA1C in the last 72  hours. CBG: No results for input(s): GLUCAP in the last 168 hours. Lipid Profile: No results for input(s): CHOL, HDL, LDLCALC, TRIG, CHOLHDL, LDLDIRECT in the last 72 hours. Thyroid Function Tests: No results for input(s): TSH, T4TOTAL, FREET4, T3FREE, THYROIDAB in the last 72 hours. Anemia Panel: No results for input(s): VITAMINB12, FOLATE, FERRITIN, TIBC, IRON, RETICCTPCT in the last 72 hours. Urine analysis:    Component Value Date/Time   COLORURINE YELLOW 02/14/2015 1014   APPEARANCEUR CLEAR 02/14/2015 1014   LABSPEC 1.016 02/14/2015 1014   PHURINE 5.0 02/14/2015 1014   GLUCOSEU NEGATIVE 02/14/2015 1014   HGBUR SMALL (A) 02/14/2015 1014   BILIRUBINUR NEGATIVE 02/14/2015 1014   KETONESUR NEGATIVE 02/14/2015 1014   PROTEINUR 100 (A) 02/14/2015 1014   NITRITE NEGATIVE 02/14/2015 1014   LEUKOCYTESUR NEGATIVE 02/14/2015 1014   Sepsis Labs: !!!!!!!!!!!!!!!!!!!!!!!!!!!!!!!!!!!!!!!!!!!! @LABRCNTIP (procalcitonin:4,lacticidven:4) )No results found for this or any previous visit (from the past 240 hour(s)).   Radiological Exams on Admission: Dg Chest 2 View  Result Date:  10/16/2015 CLINICAL DATA:  Cough and shortness of breath for 2 days. History of asthma and CHF EXAM: CHEST  2 VIEW COMPARISON:  10/16/2015 FINDINGS: The heart is enlarged and stable since earlier chest film but definitely enlarged since the prior chest x-ray from 02/14/2015. There is interstitial pulmonary edema, peribronchial thickening and streaky areas of atelectasis. No large pleural effusion. Small amount of fluid is noted in the right major fissure. The bony thorax is intact. IMPRESSION: Cardiac enlargement, central vascular congestion and interstitial pulmonary edema. No [name] pleural effusion or focal infiltrate. Electronically Signed   By: Rudie Meyer M.D.   On: 10/16/2015 16:04   Dg Chest Port 1 View  Result Date: 10/16/2015 CLINICAL DATA:  Asthma with shortness of breath, wheezing, and chest tightness. EXAM: PORTABLE CHEST 1 VIEW COMPARISON:  02/14/2015 FINDINGS: [facility] semi upright chest obtained 1407 hours. The cardio pericardial silhouette is enlarged. Pulmonary vascular congestion with bilateral hazy open opacity over the lungs. Edema is a concern. Contribution to appearance of overlying soft tissue is suspected. The visualized bony structures of the thorax are intact. Telemetry leads overlie the chest. IMPRESSION: Cardiopericardial silhouette is enlarged. Pericardial effusion not excluded. Vascular congestion with probable interstitial pulmonary edema. Dedicated upright PA and lateral chest x-ray would probably prove helpful to better characterize, when the patient is able. Electronically Signed   By: Kennith Center M.D.   On: 10/16/2015 14:27    EKG: Independently reviewed. Sinus at 97/min, with borderline qt prolongation.   Assessment/Plan Active Problems:   COPD (chronic obstructive pulmonary disease) (HCC)   Acute exacerbation of CHF (congestive heart failure) (HCC)  Acute respiratory failure with hypoxia from a combination of acute copd exacerbation and acute on chronic systolic and  diastolic heart failure: - start on IV lasix, daily weights and strict intake and output.  - repeat echocardiogram.  - serial troponin's and repeat EKG IN AM.  - will request cardiology for consult in am.  - start her on solumedrol for copd exacerbation, resume duo nebs and Q var.    Accelerated Hypertension: - resume home meds except for lisinopril for acute on CKD stage 4.  - hydralazine IV prn.  - resume coreg/ lasix / hydralazine.   Acute on CKD stage  4: ? Fluid overloaded, CXR shows vascular congestion.  Stopped lisinopril.  BNP is 2003 Diuresis and monitor renal parameters.   Iron deficiency Anemia ; - dropped from 10 to 8.5.  - ferritin and iron levels were low.  DVT prophylaxis: lovenox Code Status: full code.  Family Communication: partner at bedside.  Disposition Plan: pending further evaluation.  Consults called: cardiology for tomorrow.  Admission status: inpatient and stepdown.    Kathlen Mody MD [facility] Pager [phone]  If 7PM-7AM, please contact night-coverage [email or web address] Password [email or web address]  10/16/2015, 5:42 PM

## 2015-10-16 NOTE — ED Notes (Addendum)
Pt stated that she was hungry. I asked the nurse if she was able to eat since her labs had resulted. When I went back to the room to ask the patient what she would like to eat, her response was "i don't want no crackers, I need some meat and real food". I told her that I could offer her a sandwich or a tray from the cafeteria. The patients response was "my husband is almost here. I have to go. I need some meat and real food. I don't want no crackers. Its okay my husband is on the way."

## 2015-10-16 NOTE — ED Notes (Addendum)
Pt asked for food, offered pt sandwich or crackers. Even offered to order tray. Pt got upset and verbalized she did want any of hospital food ,stated that she is calling husband to get her food and thatt she will leave after xray.  Pt transported to xray .

## 2015-10-16 NOTE — ED Notes (Signed)
Pt went outside to smoke a cigarette w/o letting staff know.

## 2015-10-16 NOTE — Progress Notes (Signed)
CRITICAL VALUE ALERT  Critical value received:  Troponin 0.14  Date of notification:  10/16/2015  Time of notification:  0752  Critical value read back:Yes.    Nurse who received alert:  Leonia ReevesMandesia, RN  MD notified (1st page):  Craige CottaKirby, NP  Time of first page:  [phone]  MD notified (2nd page):  Time of second page:  Responding MD:  Craige CottaKirby, NP. Provider was also notified that patient had already left AMA at the time of the lab result being called in.   Time MD responded:  [phone]

## 2015-10-16 NOTE — ED Notes (Signed)
PT exited department without permission and seen in front of ER smoking cigarettes; pt redirected to her room.

## 2015-10-16 NOTE — Progress Notes (Signed)
[facility].  Patient does not have insurance. EDCM spoke to patient at bedside.  Dr. Venetia NightAmao is listed as patient's previous provider.  [facility]  [facility] to [facility].  Patient thankful for services.  No further EDCM needs at this time.

## 2015-10-16 NOTE — Progress Notes (Signed)
RN called because pt wanted to leave AMA. By the time NP called back, pt had already signed AMA papers. Pt admitted with accelerated HTN earlier today and last BP is still over 200. Per RN, pt was caught outside smoking and wants to leave because she can't smoke (she refused nicoderm patch) and we "don't have anything to eat here". Per RN, pt's husband left to get her something to eat, but now she will not stay and wait. RN had already informed pt that she has risks with leaving before completing treatment. NP asked RN to remind pt she also has a risk of death if she leaves with such a high BP. NP could hear pt and conversation in background. Pt stated she didn't care and was leaving.  KJKG, NP [facility]

## 2015-10-16 NOTE — ED Triage Notes (Signed)
She c/o "bad asthma". She has used all of her resources at home, which includes duoneb. Treatments to no avail. She is actually dyspneic. EKG performed at triage; then taken to rm. 18. Her skin is normal, warm and dry and she is able to only speak in short sentences.

## 2015-10-16 NOTE — ED Provider Notes (Signed)
[facility]-EMERGENCY DEPT Provider Note   CSN: [id number] Arrival date & time: 10/16/15  1241     History   Chief Complaint Chief Complaint  Patient presents with  . Shortness of Breath    HPI April [name] is a 40 y.o. female.  Patient is a 40 year old female with a history of asthma who presents with wheezing and shortness of breath. She states she's had a 2 day history of worsening symptoms. She has a cough which is mostly dry. She denies any fevers. No URI symptoms. No vomiting or diarrhea. No chest pain other than she feels sore across her chest from the coughing. She's been using her treatments at home including her nebulizer treatments without improvement in symptoms. Of note, she also has a history of CHF, chronic kidney disease and iron deficiency anemia. She states she's been out of her medicines for a while given her lack of primary care. She denies any leg pain or swelling. She was recently treated for cellulitis which has improved.    Shortness of Breath  Associated symptoms include cough. Pertinent negatives include no fever, no headaches, no rhinorrhea, no chest pain, no vomiting, no abdominal pain, no rash and no leg swelling.    Past Medical History:  Diagnosis Date  . Anemia   . Asthma   . Cellulitis of right lower extremity 08/29/2015  . CHF (congestive heart failure) (HCC)   . Chronic kidney disease (CKD)   . COPD (chronic obstructive pulmonary disease) (HCC)   . Depression   . Hypertension   . Migraine    "a few times/month" (08/29/2015)  . On home oxygen therapy    "when needed" (08/29/2015)  . OSA (obstructive sleep apnea)    "don't wear mask" (08/29/2015)    Patient Active Problem List   Diagnosis Date Noted  . COPD (chronic obstructive pulmonary disease) (HCC) 10/16/2015  . Iron deficiency anemia   . Chronic diastolic congestive heart failure (HCC)   . Cellulitis of right lower extremity 08/29/2015  . Vitamin D deficiency 01/11/2015  .  Fatigue 01/10/2015  . Dyspnea 01/10/2015  . Migraine 10/17/2014  . Chronic kidney disease 10/17/2014  . Asthma 09/04/2014  . Essential hypertension 09/03/2014  . Congestive heart failure (HCC) 09/03/2014    Past Surgical History:  Procedure Laterality Date  . I&D EXTREMITY Right 09/03/2015   Procedure: IRRIGATION AND DEBRIDEMENT abcess LOWER LEG with complex closure;  Surgeon: Sheral Apleyimothy D Murphy, MD;  Location: [facility] OR;  Service: Orthopedics;  Laterality: Right;  . NO PAST SURGERIES      OB History    No data available       Home Medications    Prior to Admission medications   Medication Sig Start Date End Date Taking? Authorizing Provider  albuterol (PROVENTIL HFA;VENTOLIN HFA) 108 (90 BASE) MCG/ACT inhaler Inhale 2 puffs into the lungs every 4 (four) hours as needed for wheezing or shortness of breath. 10/16/14  Yes Kristen N Ward, DO  albuterol (PROVENTIL) (2.5 MG/3ML) 0.083% nebulizer solution Take 2.5 mg by nebulization every 6 (six) hours as needed for wheezing or shortness of breath.   Yes Historical Provider, MD  beclomethasone (QVAR) 80 MCG/ACT inhaler Inhale 1 puff into the lungs 2 (two) times daily. 10/17/14  Yes Jaclyn ShaggyEnobong Amao, MD  butalbital-acetaminophen-caffeine (FIORICET) 50-325-40 MG tablet Take 1-2 tablets by mouth every 6 (six) hours as needed for headache. 10/16/14 10/16/15 Yes Kristen N Ward, DO  carvedilol (COREG) 25 MG tablet Take 1 tablet (25 mg total) by mouth  2 (two) times daily with a meal. 12/25/14  Yes Jaclyn Shaggy, MD  ferrous sulfate 325 (65 FE) MG tablet Take 1 tablet (325 mg total) by mouth daily. 08/29/15  Yes Gilda Crease, MD  furosemide (LASIX) 40 MG tablet Take 1 tablet (40 mg total) by mouth 2 (two) times daily. Patient taking differently: Take 40 mg by mouth every morning.  12/25/14  Yes Jaclyn Shaggy, MD  hydrALAZINE (APRESOLINE) 50 MG tablet Take 1 tablet (50 mg total) by mouth 2 (two) times daily. 12/25/14  Yes Jaclyn Shaggy, MD  lisinopril  (PRINIVIL,ZESTRIL) 20 MG tablet Take 1 tablet (20 mg total) by mouth daily. 04/05/15  Yes Audry Pili, PA-C  oxymetazoline (AFRIN NASAL SPRAY) 0.05 % nasal spray Place 1 spray into right nostril 2 (two) times daily as needed for congestion. 04/05/15  Yes Audry Pili, PA-C  topiramate (TOPAMAX) 50 MG tablet Take 1 tablet (50 mg total) by mouth 2 (two) times daily. 10/17/14  Yes Jaclyn Shaggy, MD  clindamycin (CLEOCIN) 150 MG capsule Take 1 capsule (150 mg total) by mouth 4 (four) times daily. Patient not taking: Reported on 10/16/2015 09/07/15   Laurence Spates, MD    Family History Family History  Problem Relation Age of Onset  . Diabetes Mother   . Hypertension Mother   . Cancer Father   . Diabetes Maternal Grandmother   . Cancer Maternal Grandfather   . Cancer Paternal Grandfather     Social History Social History  Substance Use Topics  . Smoking status: Current Every Day Smoker    Packs/day: 0.60    Years: 20.00    Types: Cigarettes  . Smokeless tobacco: Never Used  . Alcohol use 4.2 oz/week    7 Cans of beer per week     Allergies   Review of patient's allergies indicates no known allergies.   Review of Systems Review of Systems  Constitutional: Positive for fatigue. Negative for chills, diaphoresis and fever.  HENT: Negative for congestion, rhinorrhea and sneezing.   Eyes: Negative.   Respiratory: Positive for cough and shortness of breath. Negative for chest tightness.   Cardiovascular: Negative for chest pain and leg swelling.  Gastrointestinal: Negative for abdominal pain, blood in stool, diarrhea, nausea and vomiting.  Genitourinary: Negative for difficulty urinating, flank pain, frequency and hematuria.  Musculoskeletal: Negative for arthralgias and back pain.  Skin: Negative for rash.  Neurological: Negative for dizziness, speech difficulty, weakness, numbness and headaches.     Physical Exam Updated Vital Signs BP (!) 180/123   Pulse 101   Temp 99.1 F  (37.3 C) (Oral)   Resp 18   Ht 5\' 4"  (1.626 m)   Wt 180 lb 11.2 oz (82 kg)   LMP 10/14/2015 (Exact Date)   SpO2 100%   BMI 31.02 kg/m   Physical Exam  Constitutional: She is oriented to person, place, and time. She appears well-developed and well-nourished. She appears distressed.  HENT:  Head: Normocephalic and atraumatic.  Eyes: Pupils are equal, round, and reactive to light.  Neck: Normal range of motion. Neck supple.  Cardiovascular: Normal rate, regular rhythm and normal heart sounds.   Pulmonary/Chest: She is in respiratory distress. She has wheezes. She has no rales. She exhibits no tenderness.  Patient has increased work of breathing and is talking in short sentences. She has marked tachypnea. She has wheezing and diminished breath sounds bilaterally.  Abdominal: Soft. Bowel sounds are normal. There is no tenderness. There is no rebound and no guarding.  Musculoskeletal: Normal range of motion. She exhibits no edema.  Trace edema bilaterally. No calf tenderness  Lymphadenopathy:    She has no cervical adenopathy.  Neurological: She is alert and oriented to person, place, and time.  Skin: Skin is warm and dry. No rash noted.  Psychiatric: She has a normal mood and affect.     ED Treatments / Results  Labs (all labs ordered are listed, but only abnormal results are displayed) Labs Reviewed  BASIC METABOLIC PANEL - Abnormal; Notable for the following:       Result Value   CO2 20 (*)    BUN 28 (*)    Creatinine, Ser 2.88 (*)    Calcium 8.5 (*)    GFR calc non Af Amer 19 (*)    GFR calc Af Amer 22 (*)    All other components within normal limits  CBC WITH DIFFERENTIAL/PLATELET - Abnormal; Notable for the following:    RBC 3.08 (*)    Hemoglobin 8.5 (*)    HCT 26.9 (*)    RDW 18.3 (*)    All other components within normal limits  BRAIN NATRIURETIC PEPTIDE - Abnormal; Notable for the following:    B Natriuretic Peptide 2,003.1 (*)    All other components within  normal limits  I-STAT TROPOININ, ED    EKG  EKG Interpretation  Date/Time:  Wednesday October 16 2015 13:38:34 EDT Ventricular Rate:  97 PR Interval:    QRS Duration: 86 QT Interval:  389 QTC Calculation: 495 R Axis:   92 Text Interpretation:  Sinus rhythm Biatrial enlargement Borderline right axis deviation Left ventricular hypertrophy Borderline prolonged QT interval since last tracing no significant change Confirmed by Anysa Tacey  MD, Faron Tudisco ([id number]) on 10/16/2015 2:58:50 PM       Radiology Dg Chest 2 View  Result Date: 10/16/2015 CLINICAL DATA:  Cough and shortness of breath for 2 days. History of asthma and CHF EXAM: CHEST  2 VIEW COMPARISON:  10/16/2015 FINDINGS: The heart is enlarged and stable since earlier chest film but definitely enlarged since the prior chest x-ray from 02/14/2015. There is interstitial pulmonary edema, peribronchial thickening and streaky areas of atelectasis. No large pleural effusion. Small amount of fluid is noted in the right major fissure. The bony thorax is intact. IMPRESSION: Cardiac enlargement, central vascular congestion and interstitial pulmonary edema. No all pleural effusion or focal infiltrate. Electronically Signed   By: Rudie Meyer M.D.   On: 10/16/2015 16:04   Dg Chest Port 1 View  Result Date: 10/16/2015 CLINICAL DATA:  Asthma with shortness of breath, wheezing, and chest tightness. EXAM: PORTABLE CHEST 1 VIEW COMPARISON:  02/14/2015 FINDINGS: AP semi upright chest obtained 1407 hours. The cardio pericardial silhouette is enlarged. Pulmonary vascular congestion with bilateral hazy open opacity over the lungs. Edema is a concern. Contribution to appearance of overlying soft tissue is suspected. The visualized bony structures of the thorax are intact. Telemetry leads overlie the chest. IMPRESSION: Cardiopericardial silhouette is enlarged. Pericardial effusion not excluded. Vascular congestion with probable interstitial pulmonary edema. Dedicated  upright PA and lateral chest x-ray would probably prove helpful to better characterize, when the patient is able. Electronically Signed   By: Kennith Center M.D.   On: 10/16/2015 14:27    Procedures Procedures (including critical care time)  Medications Ordered in ED Medications  0.9 %  sodium chloride infusion ( Intravenous New Bag/Given 10/16/15 1441)  nitroGLYCERIN (NITROSTAT) SL tablet 0.4 mg (0.4 mg Sublingual Given 10/16/15 1600)  albuterol (PROVENTIL) (  2.5 MG/3ML) 0.083% nebulizer solution 5 mg (5 mg Nebulization Given 10/16/15 1343)  magnesium sulfate IVPB 2 g 50 mL (2 g Intravenous New Bag/Given 10/16/15 1439)  methylPREDNISolone sodium succinate (SOLU-MEDROL) 125 mg/2 mL injection 125 mg (125 mg Intravenous Given 10/16/15 1437)  albuterol (PROVENTIL,VENTOLIN) solution continuous neb (10 mg/hr Nebulization Given 10/16/15 1416)  furosemide (LASIX) injection 20 mg (20 mg Intravenous Given 10/16/15 1600)     Initial Impression / Assessment and Plan / ED Course  I have reviewed the triage vital signs and the nursing notes.  Pertinent labs & imaging results that were available during my care of the patient were reviewed by me and considered in my medical decision making (see chart for details).  Clinical Course    Patient presents with wheezing and shortness of breath. She feels her symptoms are consistent with her prior asthma exacerbations. She was markedly distressed on arrival. She was given continuous nebs. She was also given Solu-Medrol and magnesium. She improved markedly after this. She still has wheezing and mild tachypnea but is talking in full sentences. Her chest x-ray has a markedly enlarged cardiac silhouette. She doesn't have any clinical suggestions of pericardial tamponade. She does have suggestions of fluid overload with pulmonary edema on x-ray and elevated BNP. She's been off of her medications for a long time per her report. She doesn't have any ongoing chest pain or  symptoms that would be more consistent with acute coronary syndrome. Her EKG doesn't show any ischemic changes. Her troponin is currently negative.Her labs show an elevated creatinine which is similar to her baseline values. She also has a low hemoglobin which also appears to be chronic. Her last hemoglobin values range between 7 and 10. Has a history of iron deficiency anemia. Patient will need to be admitted for further treatment as well as cardiac evaluation. She likely will need a echocardiogram. I'll consult the hospitalist for admission.  I spoke with Dr. Blake Divine who will admit the pt.  CRITICAL CARE Performed by: Elin Seats Total critical care time: 45 minutes Critical care time was exclusive of separately billable procedures and treating other patients. Critical care was necessary to treat or prevent imminent or life-threatening deterioration. Critical care was time spent personally by me on the following activities: development of treatment plan with patient and/or surrogate as well as nursing, discussions with consultants, evaluation of patient's response to treatment, examination of patient, obtaining history from patient or surrogate, ordering and performing treatments and interventions, ordering and review of laboratory studies, ordering and review of radiographic studies, pulse oximetry and re-evaluation of patient's condition.   Final Clinical Impressions(s) / ED Diagnoses   Final diagnoses:  COPD exacerbation (HCC)  Systolic congestive heart failure, unspecified congestive heart failure chronicity (HCC)    New Prescriptions New Prescriptions   No medications on file     Rolan Bucco, MD 10/16/15 1627

## 2015-10-18 ENCOUNTER — Telehealth: Payer: Self-pay | Admitting: General Practice

## 2015-10-18 ENCOUNTER — Telehealth: Payer: Self-pay

## 2015-10-18 NOTE — Telephone Encounter (Signed)
This Case Manager received communication from Radford PaxAmy Ferrero, [facility], that patient uninsured and needs hospital follow-up appointment. PCP: Dr. Venetia NightAmao. EMR reviewed, patient left hospital on 10/16/15 AMA though was being admitted for COPD, CHF exacerbation, accelerated HTN, acute on CKD stage 4, iron deficiency, anemia. Troponin 0.14. Discussed with Dr. Venetia NightAmao who indicated patient needs to be called and told to get to ED immediately as she may be having a heart attack. Call placed to #[phone]; unable to reach patient. HIPPA compliant voicemail left requesting patient call this Case Manager back immediately. In addition, call placed to #[phone]; unable to reach patient. HIPPA compliant voicemail left requesting patient call this Case Manager back immediately.

## 2015-10-18 NOTE — Telephone Encounter (Signed)
This Case Manager received communication from Radford PaxAmy Ferrero, [facility], that patient uninsured and needs hospital follow-up appointment. PCP: Dr. Venetia NightAmao. EMR reviewed, patient left hospital on 10/16/15 AMA though was being admitted for COPD, CHF exacerbation, accelerated HTN, acute on CKD stage 4, iron deficiency, anemia. Troponin 0.14. Dr. Venetia NightAmao has indicated patient needs to be called and told to get to ED immediately as she may be having a heart attack. Attempt #2 to reach patient-call placed once again to #[phone]; unable to reach patient. An additional HIPPA compliant voicemail left requesting patient call this Case Manager back immediately. In addition, call placed to #[phone]; unable to reach patient.

## 2015-10-19 ENCOUNTER — Inpatient Hospital Stay (HOSPITAL_COMMUNITY)
Admission: EM | Admit: 2015-10-19 | Discharge: 2015-10-22 | DRG: 291 | Disposition: A | Discharge status: Home / Self Care | Payer: PRIVATE HEALTH INSURANCE | Attending: Internal Medicine | Admitting: Internal Medicine

## 2015-10-19 ENCOUNTER — Emergency Department (HOSPITAL_COMMUNITY): Payer: PRIVATE HEALTH INSURANCE

## 2015-10-19 ENCOUNTER — Encounter (HOSPITAL_COMMUNITY): Payer: Self-pay | Admitting: Emergency Medicine

## 2015-10-19 DIAGNOSIS — I110 Hypertensive heart disease with heart failure: Secondary | ICD-10-CM

## 2015-10-19 DIAGNOSIS — G43909 Migraine, unspecified, not intractable, without status migrainosus: Secondary | ICD-10-CM | POA: Diagnosis present

## 2015-10-19 DIAGNOSIS — I130 Hypertensive heart and chronic kidney disease with heart failure and stage 1 through stage 4 chronic kidney disease, or unspecified chronic kidney disease: Principal | ICD-10-CM | POA: Diagnosis present

## 2015-10-19 DIAGNOSIS — Z9111 Patient's noncompliance with dietary regimen: Secondary | ICD-10-CM

## 2015-10-19 DIAGNOSIS — G4733 Obstructive sleep apnea (adult) (pediatric): Secondary | ICD-10-CM | POA: Diagnosis present

## 2015-10-19 DIAGNOSIS — J449 Chronic obstructive pulmonary disease, unspecified: Secondary | ICD-10-CM | POA: Diagnosis present

## 2015-10-19 DIAGNOSIS — T501X6A Underdosing of loop [high-ceiling] diuretics, initial encounter: Secondary | ICD-10-CM | POA: Diagnosis present

## 2015-10-19 DIAGNOSIS — R51 Headache: Secondary | ICD-10-CM

## 2015-10-19 DIAGNOSIS — I509 Heart failure, unspecified: Secondary | ICD-10-CM

## 2015-10-19 DIAGNOSIS — Z91128 Patient's intentional underdosing of medication regimen for other reason: Secondary | ICD-10-CM

## 2015-10-19 DIAGNOSIS — Y92009 Unspecified place in unspecified non-institutional (private) residence as the place of occurrence of the external cause: Secondary | ICD-10-CM

## 2015-10-19 DIAGNOSIS — J441 Chronic obstructive pulmonary disease with (acute) exacerbation: Secondary | ICD-10-CM

## 2015-10-19 DIAGNOSIS — Z833 Family history of diabetes mellitus: Secondary | ICD-10-CM

## 2015-10-19 DIAGNOSIS — R0602 Shortness of breath: Secondary | ICD-10-CM

## 2015-10-19 DIAGNOSIS — J9811 Atelectasis: Secondary | ICD-10-CM | POA: Diagnosis present

## 2015-10-19 DIAGNOSIS — Z9981 Dependence on supplemental oxygen: Secondary | ICD-10-CM

## 2015-10-19 DIAGNOSIS — I5043 Acute on chronic combined systolic (congestive) and diastolic (congestive) heart failure: Secondary | ICD-10-CM | POA: Diagnosis present

## 2015-10-19 DIAGNOSIS — R0603 Acute respiratory distress: Secondary | ICD-10-CM | POA: Diagnosis present

## 2015-10-19 DIAGNOSIS — R519 Headache, unspecified: Secondary | ICD-10-CM

## 2015-10-19 DIAGNOSIS — N179 Acute kidney failure, unspecified: Secondary | ICD-10-CM | POA: Diagnosis present

## 2015-10-19 DIAGNOSIS — D509 Iron deficiency anemia, unspecified: Secondary | ICD-10-CM | POA: Diagnosis present

## 2015-10-19 DIAGNOSIS — I10 Essential (primary) hypertension: Secondary | ICD-10-CM | POA: Diagnosis present

## 2015-10-19 DIAGNOSIS — F17210 Nicotine dependence, cigarettes, uncomplicated: Secondary | ICD-10-CM | POA: Diagnosis present

## 2015-10-19 DIAGNOSIS — Z8249 Family history of ischemic heart disease and other diseases of the circulatory system: Secondary | ICD-10-CM

## 2015-10-19 DIAGNOSIS — N189 Chronic kidney disease, unspecified: Secondary | ICD-10-CM

## 2015-10-19 DIAGNOSIS — I119 Hypertensive heart disease without heart failure: Secondary | ICD-10-CM | POA: Diagnosis present

## 2015-10-19 DIAGNOSIS — I5033 Acute on chronic diastolic (congestive) heart failure: Secondary | ICD-10-CM | POA: Diagnosis present

## 2015-10-19 DIAGNOSIS — N184 Chronic kidney disease, stage 4 (severe): Secondary | ICD-10-CM | POA: Diagnosis present

## 2015-10-19 LAB — CBC WITH DIFFERENTIAL/PLATELET
BASOS ABS: 0.1 10*3/uL (ref 0.0–0.1)
BASOS PCT: 2 %
EOS ABS: 0.4 10*3/uL (ref 0.0–0.7)
Eosinophils Relative: 6 %
HCT: 26.3 % — ABNORMAL LOW (ref 36.0–46.0)
HEMOGLOBIN: 8.1 g/dL — AB (ref 12.0–15.0)
Lymphocytes Relative: 27 %
Lymphs Abs: 1.9 10*3/uL (ref 0.7–4.0)
MCH: 26.6 pg (ref 26.0–34.0)
MCHC: 30.8 g/dL (ref 30.0–36.0)
MCV: 86.5 fL (ref 78.0–100.0)
Monocytes Absolute: 0.3 10*3/uL (ref 0.1–1.0)
Monocytes Relative: 5 %
NEUTROS PCT: 60 %
Neutro Abs: 4.2 10*3/uL (ref 1.7–7.7)
Platelets: 299 10*3/uL (ref 150–400)
RBC: 3.04 MIL/uL — AB (ref 3.87–5.11)
RDW: 17.9 % — ABNORMAL HIGH (ref 11.5–15.5)
WBC: 6.9 10*3/uL (ref 4.0–10.5)

## 2015-10-19 LAB — IRON AND TIBC
IRON: 25 ug/dL — AB (ref 28–170)
Saturation Ratios: 8 % — ABNORMAL LOW (ref 10.4–31.8)
TIBC: 302 ug/dL (ref 250–450)
UIBC: 277 ug/dL

## 2015-10-19 LAB — BASIC METABOLIC PANEL
ANION GAP: 9 (ref 5–15)
BUN: 34 mg/dL — ABNORMAL HIGH (ref 6–20)
CALCIUM: 8.1 mg/dL — AB (ref 8.9–10.3)
CO2: 20 mmol/L — ABNORMAL LOW (ref 22–32)
CREATININE: 3.34 mg/dL — AB (ref 0.44–1.00)
Chloride: 110 mmol/L (ref 101–111)
GFR, EST AFRICAN AMERICAN: 19 mL/min — AB (ref >60)
GFR, EST NON AFRICAN AMERICAN: 16 mL/min — AB (ref >60)
Glucose, Bld: 96 mg/dL (ref 65–99)
Potassium: 4 mmol/L (ref 3.5–5.1)
SODIUM: 139 mmol/L (ref 135–145)

## 2015-10-19 LAB — BRAIN NATRIURETIC PEPTIDE: B NATRIURETIC PEPTIDE 5: 1892.1 pg/mL — AB (ref 0.0–100.0)

## 2015-10-19 LAB — HCG, SERUM, QUALITATIVE: PREG SERUM: NEGATIVE

## 2015-10-19 LAB — VITAMIN B12: Vitamin B-12: 511 pg/mL (ref 180–914)

## 2015-10-19 LAB — RETICULOCYTES
RBC.: 3.08 MIL/uL — ABNORMAL LOW (ref 3.87–5.11)
Retic Count, Absolute: 98.6 10*3/uL (ref 19.0–186.0)
Retic Ct Pct: 3.2 % — ABNORMAL HIGH (ref 0.4–3.1)

## 2015-10-19 LAB — I-STAT TROPONIN, ED: TROPONIN I, POC: 0.05 ng/mL (ref 0.00–0.08)

## 2015-10-19 LAB — FERRITIN: FERRITIN: 41 ng/mL (ref 11–307)

## 2015-10-19 LAB — CREATININE, SERUM
Creatinine, Ser: 3.35 mg/dL — ABNORMAL HIGH (ref 0.44–1.00)
GFR calc non Af Amer: 16 mL/min — ABNORMAL LOW (ref >60)
GFR, EST AFRICAN AMERICAN: 19 mL/min — AB (ref >60)

## 2015-10-19 LAB — TROPONIN I: TROPONIN I: 0.09 ng/mL — AB (ref <0.03)

## 2015-10-19 LAB — FOLATE: Folate: 9.2 ng/mL (ref >5.9)

## 2015-10-19 MED ORDER — SODIUM CHLORIDE 0.9 % IV SOLN: 250.0000 mL | INTRAVENOUS | Status: DC | PRN

## 2015-10-19 MED ORDER — FUROSEMIDE 10 MG/ML IJ SOLN
40.0000 mg | Freq: Two times a day (BID) | INTRAMUSCULAR | Status: DC
  Administered 2015-10-19 – 2015-10-20 (×3): 40 mg via INTRAVENOUS
  Filled 2015-10-19 (×3): qty 4

## 2015-10-19 MED ORDER — SODIUM CHLORIDE 0.9% FLUSH
3.0000 mL | Freq: Two times a day (BID) | INTRAVENOUS | Status: DC
  Administered 2015-10-19 – 2015-10-21 (×6): 3 mL via INTRAVENOUS

## 2015-10-19 MED ORDER — HYDRALAZINE HCL 50 MG PO TABS: 50.0000 mg | ORAL_TABLET | Freq: Two times a day (BID) | ORAL | Status: DC

## 2015-10-19 MED ORDER — ALPRAZOLAM 0.25 MG PO TABS
0.2500 mg | ORAL_TABLET | Freq: Two times a day (BID) | ORAL | Status: DC | PRN
  Administered 2015-10-19: 0.25 mg via ORAL
  Filled 2015-10-19: qty 1

## 2015-10-19 MED ORDER — NITROGLYCERIN 2 % TD OINT
1.0000 [in_us] | TOPICAL_OINTMENT | Freq: Once | TRANSDERMAL | Status: AC
  Administered 2015-10-19: 1 [in_us] via TOPICAL
  Filled 2015-10-19: qty 1

## 2015-10-19 MED ORDER — IPRATROPIUM-ALBUTEROL 0.5-2.5 (3) MG/3ML IN SOLN
3.0000 mL | RESPIRATORY_TRACT | Status: DC
  Filled 2015-10-19: qty 3

## 2015-10-19 MED ORDER — FERROUS SULFATE 325 (65 FE) MG PO TABS
325.0000 mg | ORAL_TABLET | Freq: Every day | ORAL | Status: DC
  Administered 2015-10-19 – 2015-10-22 (×4): 325 mg via ORAL
  Filled 2015-10-19 (×4): qty 1

## 2015-10-19 MED ORDER — ONDANSETRON HCL 4 MG/2ML IJ SOLN: 4.0000 mg | Freq: Four times a day (QID) | INTRAMUSCULAR | Status: DC | PRN

## 2015-10-19 MED ORDER — ACETAMINOPHEN 325 MG PO TABS
650.0000 mg | ORAL_TABLET | ORAL | Status: DC | PRN
  Administered 2015-10-19: 650 mg via ORAL
  Filled 2015-10-19: qty 2

## 2015-10-19 MED ORDER — FUROSEMIDE 10 MG/ML IJ SOLN
40.0000 mg | Freq: Once | INTRAMUSCULAR | Status: AC
  Administered 2015-10-19: 40 mg via INTRAVENOUS
  Filled 2015-10-19: qty 4

## 2015-10-19 MED ORDER — ALBUTEROL SULFATE (2.5 MG/3ML) 0.083% IN NEBU
5.0000 mg | INHALATION_SOLUTION | Freq: Once | RESPIRATORY_TRACT | Status: AC
  Administered 2015-10-19: 5 mg via RESPIRATORY_TRACT
  Filled 2015-10-19: qty 6

## 2015-10-19 MED ORDER — IPRATROPIUM BROMIDE 0.02 % IN SOLN
0.5000 mg | Freq: Once | RESPIRATORY_TRACT | Status: AC
  Administered 2015-10-19: 0.5 mg via RESPIRATORY_TRACT
  Filled 2015-10-19: qty 2.5

## 2015-10-19 MED ORDER — ALBUTEROL SULFATE (2.5 MG/3ML) 0.083% IN NEBU
INHALATION_SOLUTION | RESPIRATORY_TRACT | Status: AC
  Filled 2015-10-19: qty 6

## 2015-10-19 MED ORDER — SODIUM CHLORIDE 0.9% FLUSH: 3.0000 mL | INTRAVENOUS | Status: DC | PRN

## 2015-10-19 MED ORDER — TRAMADOL HCL 50 MG PO TABS
50.0000 mg | ORAL_TABLET | Freq: Four times a day (QID) | ORAL | Status: DC | PRN
  Administered 2015-10-19 – 2015-10-20 (×2): 50 mg via ORAL
  Filled 2015-10-19 (×2): qty 1

## 2015-10-19 MED ORDER — ACETAMINOPHEN 500 MG PO TABS
500.0000 mg | ORAL_TABLET | Freq: Every evening | ORAL | Status: DC | PRN
  Administered 2015-10-22: 1000 mg via ORAL
  Filled 2015-10-19: qty 2

## 2015-10-19 MED ORDER — FUROSEMIDE 10 MG/ML IJ SOLN: 40.0000 mg | Freq: Two times a day (BID) | INTRAMUSCULAR | Status: DC

## 2015-10-19 MED ORDER — IPRATROPIUM-ALBUTEROL 0.5-2.5 (3) MG/3ML IN SOLN: 3.0000 mL | RESPIRATORY_TRACT | Status: DC | PRN

## 2015-10-19 MED ORDER — IPRATROPIUM BROMIDE HFA 17 MCG/ACT IN AERS: 2.0000 | INHALATION_SPRAY | Freq: Once | RESPIRATORY_TRACT | Status: DC

## 2015-10-19 MED ORDER — ISOSORBIDE MONONITRATE ER 30 MG PO TB24
30.0000 mg | ORAL_TABLET | Freq: Every day | ORAL | Status: DC
  Administered 2015-10-19 – 2015-10-22 (×4): 30 mg via ORAL
  Filled 2015-10-19 (×4): qty 1

## 2015-10-19 MED ORDER — HEPARIN SODIUM (PORCINE) 5000 UNIT/ML IJ SOLN
5000.0000 [IU] | Freq: Three times a day (TID) | INTRAMUSCULAR | Status: DC
  Administered 2015-10-19: 5000 [IU] via SUBCUTANEOUS
  Filled 2015-10-19 (×2): qty 1

## 2015-10-19 MED ORDER — DIPHENHYDRAMINE-APAP (SLEEP) 25-500 MG PO TABS: 1.0000 | ORAL_TABLET | Freq: Every evening | ORAL | Status: DC | PRN

## 2015-10-19 MED ORDER — CLONIDINE HCL 0.1 MG PO TABS
0.1000 mg | ORAL_TABLET | Freq: Once | ORAL | Status: AC
  Administered 2015-10-19: 0.1 mg via ORAL
  Filled 2015-10-19: qty 1

## 2015-10-19 MED ORDER — DIPHENHYDRAMINE HCL 25 MG PO CAPS
25.0000 mg | ORAL_CAPSULE | Freq: Every evening | ORAL | Status: DC | PRN
  Administered 2015-10-22: 50 mg via ORAL
  Filled 2015-10-19: qty 2

## 2015-10-19 MED ORDER — HYDRALAZINE HCL 20 MG/ML IJ SOLN
10.0000 mg | INTRAMUSCULAR | Status: DC | PRN
  Administered 2015-10-19 – 2015-10-20 (×3): 10 mg via INTRAVENOUS
  Filled 2015-10-19 (×3): qty 1

## 2015-10-19 MED ORDER — CARVEDILOL 25 MG PO TABS
25.0000 mg | ORAL_TABLET | Freq: Two times a day (BID) | ORAL | Status: DC
  Administered 2015-10-19 (×2): 25 mg via ORAL
  Filled 2015-10-19: qty 2
  Filled 2015-10-19: qty 1

## 2015-10-19 MED ORDER — ALBUTEROL SULFATE (2.5 MG/3ML) 0.083% IN NEBU
5.0000 mg | INHALATION_SOLUTION | Freq: Once | RESPIRATORY_TRACT | Status: AC
  Administered 2015-10-19: 5 mg via RESPIRATORY_TRACT

## 2015-10-19 MED ORDER — CARVEDILOL 12.5 MG PO TABS: 25.0000 mg | ORAL_TABLET | Freq: Two times a day (BID) | ORAL | Status: DC

## 2015-10-19 MED ORDER — NICOTINE 21 MG/24HR TD PT24
21.0000 mg | MEDICATED_PATCH | Freq: Every day | TRANSDERMAL | Status: DC
  Filled 2015-10-19: qty 1

## 2015-10-19 MED ORDER — TOPIRAMATE 25 MG PO TABS
50.0000 mg | ORAL_TABLET | Freq: Two times a day (BID) | ORAL | Status: DC
  Administered 2015-10-19 – 2015-10-22 (×7): 50 mg via ORAL
  Filled 2015-10-19 (×8): qty 2

## 2015-10-19 MED ORDER — HYDRALAZINE HCL 50 MG PO TABS
50.0000 mg | ORAL_TABLET | Freq: Two times a day (BID) | ORAL | Status: DC
  Administered 2015-10-19 (×2): 50 mg via ORAL
  Filled 2015-10-19 (×2): qty 1

## 2015-10-19 MED ORDER — OXYMETAZOLINE HCL 0.05 % NA SOLN: 1.0000 | Freq: Two times a day (BID) | NASAL | Status: DC | PRN

## 2015-10-19 NOTE — ED Notes (Signed)
lmp last day yesterday

## 2015-10-19 NOTE — ED Notes (Signed)
NP is to talk with patient, who is now agreeable to stay and notified she will not be able to smoke. Pt making unkind remarks about this Charity fundraiserN. Husband at bedside, will not put on the cardiac monitor leads, but agreeable to pulse ox.

## 2015-10-19 NOTE — ED Provider Notes (Signed)
[facility]-EMERGENCY DEPT Provider Note   CSN: [id number] Arrival date & time: 10/19/15  [id number]     History   Chief Complaint Chief Complaint  [id number] presents with  . Asthma    HPI April [name] is a 40 y.o. female.  Patient is a 40 year old female with extensive past medical history including chronic renal insufficiency, asthma, hypertension, and congestive heart failure. She presents for evaluation of shortness of breath. This is been worsening over the past several days. She denies any fevers or chills. She denies any chest pain. She denies any productive cough.  She was seen at [facility] 3 days ago, however eloped from the emergency department and declined admission.   The history is provided by the patient.  Asthma  This is a new problem. Episode onset: Several days ago. The problem occurs constantly. The problem has been gradually worsening. Associated symptoms include shortness of breath. Pertinent negatives include no chest pain. Nothing aggravates the symptoms. Nothing relieves the symptoms.    Past Medical History:  Diagnosis Date  . Anemia   . Asthma   . Cellulitis of right lower extremity 08/29/2015  . CHF (congestive heart failure) (HCC)   . Chronic kidney disease (CKD)   . COPD (chronic obstructive pulmonary disease) (HCC)   . Depression   . Hypertension   . Migraine    "a few times/month" (08/29/2015)  . On home oxygen therapy    "when needed" (08/29/2015)  . OSA (obstructive sleep apnea)    "don't wear mask" (08/29/2015)    Patient Active Problem List   Diagnosis Date Noted  . COPD (chronic obstructive pulmonary disease) (HCC) 10/16/2015  . Acute exacerbation of CHF (congestive heart failure) (HCC) 10/16/2015  . Iron deficiency anemia   . Chronic diastolic congestive heart failure (HCC)   . Cellulitis of right lower extremity 08/29/2015  . Vitamin D deficiency 01/11/2015  . Fatigue 01/10/2015  . Dyspnea 01/10/2015  . Migraine  10/17/2014  . Chronic kidney disease 10/17/2014  . Asthma 09/04/2014  . Essential hypertension 09/03/2014  . Congestive heart failure (HCC) 09/03/2014    Past Surgical History:  Procedure Laterality Date  . I&D EXTREMITY Right 09/03/2015   Procedure: IRRIGATION AND DEBRIDEMENT abcess LOWER LEG with complex closure;  Surgeon: Sheral Apleyimothy D Murphy, MD;  Location: [facility] OR;  Service: Orthopedics;  Laterality: Right;  . NO PAST SURGERIES      OB History    No data available       Home Medications    Prior to Admission medications   Medication Sig Start Date End Date Taking? Authorizing Provider  albuterol (PROVENTIL HFA;VENTOLIN HFA) 108 (90 BASE) MCG/ACT inhaler Inhale 2 puffs into the lungs every 4 (four) hours as needed for wheezing or shortness of breath. 10/16/14   Kristen N Ward, DO  albuterol (PROVENTIL) (2.5 MG/3ML) 0.083% nebulizer solution Take 2.5 mg by nebulization every 6 (six) hours as needed for wheezing or shortness of breath.    Historical Provider, MD  beclomethasone (QVAR) 80 MCG/ACT inhaler Inhale 1 puff into the lungs 2 (two) times daily. 10/17/14   Jaclyn ShaggyEnobong Amao, MD  carvedilol (COREG) 25 MG tablet Take 1 tablet (25 mg total) by mouth 2 (two) times daily with a meal. 12/25/14   Jaclyn ShaggyEnobong Amao, MD  ferrous sulfate 325 (65 FE) MG tablet Take 1 tablet (325 mg total) by mouth daily. 08/29/15   Gilda Creasehristopher J Pollina, MD  furosemide (LASIX) 40 MG tablet Take 1 tablet (40 mg total) by mouth 2 (  two) times daily. Patient taking differently: Take 40 mg by mouth every morning.  12/25/14   Jaclyn Shaggy, MD  hydrALAZINE (APRESOLINE) 50 MG tablet Take 1 tablet (50 mg total) by mouth 2 (two) times daily. 12/25/14   Jaclyn Shaggy, MD  lisinopril (PRINIVIL,ZESTRIL) 20 MG tablet Take 1 tablet (20 mg total) by mouth daily. 04/05/15   Audry Pili, PA-C  oxymetazoline (AFRIN NASAL SPRAY) 0.05 % nasal spray Place 1 spray into right nostril 2 (two) times daily as needed for congestion. 04/05/15   Audry Pili, PA-C  topiramate (TOPAMAX) 50 MG tablet Take 1 tablet (50 mg total) by mouth 2 (two) times daily. 10/17/14   Jaclyn Shaggy, MD    Family History Family History  Problem Relation Age of Onset  . Diabetes Mother   . Hypertension Mother   . Cancer Father   . Diabetes Maternal Grandmother   . Cancer Maternal Grandfather   . Cancer Paternal Grandfather     Social History Social History  Substance Use Topics  . Smoking status: Current Every Day Smoker    Packs/day: 0.60    Years: 20.00    Types: Cigarettes  . Smokeless tobacco: Never Used  . Alcohol use 1.8 oz/week    3 Cans of beer per week     Allergies   Review of patient's allergies indicates no known allergies.   Review of Systems Review of Systems  Respiratory: Positive for shortness of breath.   Cardiovascular: Negative for chest pain.  All other systems reviewed and are negative.    Physical Exam Updated Vital Signs BP (!) 214/124 (BP Location: Left Arm)   Pulse 84   Temp 97.3 F (36.3 C) (Oral)   Resp 24   Ht 5\' 4"  (1.626 m)   Wt 180 lb (81.6 kg)   LMP 10/14/2015 (Exact Date)   SpO2 100%   BMI 30.90 kg/m   Physical Exam  Constitutional: She is oriented to person, place, and time. She appears well-developed and well-nourished. No distress.  HENT:  Head: Normocephalic and atraumatic.  Neck: Normal range of motion. Neck supple.  Cardiovascular: Normal rate and regular rhythm.  Exam reveals no gallop and no friction rub.   No murmur heard. Pulmonary/Chest: Effort normal. No respiratory distress. She has no wheezes. She has rales.  There are rales in the bases bilaterally.  Abdominal: Soft. Bowel sounds are normal. She exhibits no distension. There is no tenderness.  Musculoskeletal: Normal range of motion. She exhibits no edema.  Neurological: She is alert and oriented to person, place, and time.  Skin: Skin is warm and dry. She is not diaphoretic.  Nursing note and vitals reviewed.    ED  Treatments / Results  Labs (all labs ordered are listed, but only abnormal results are displayed) Labs Reviewed  BASIC METABOLIC PANEL  CBC WITH DIFFERENTIAL/PLATELET  BRAIN NATRIURETIC PEPTIDE  I-STAT TROPOININ, ED    EKG  EKG Interpretation None       Radiology No results found.  Procedures Procedures (including critical care time)  Medications Ordered in ED Medications  albuterol (PROVENTIL) (2.5 MG/3ML) 0.083% nebulizer solution (not administered)  albuterol (PROVENTIL) (2.5 MG/3ML) 0.083% nebulizer solution 5 mg (not administered)  ipratropium (ATROVENT) nebulizer solution 0.5 mg (not administered)  albuterol (PROVENTIL) (2.5 MG/3ML) 0.083% nebulizer solution 5 mg (5 mg Nebulization Given 10/19/15 0344)     Initial Impression / Assessment and Plan / ED Course  I have reviewed the triage vital signs and the nursing notes.  Pertinent labs & imaging results that were available during my care of the patient were reviewed by me and considered in my medical decision making (see chart for details).  Clinical Course    Patient with extensive past medical history. She presents with shortness of breath. This appears to be related to congestive heart failure. She has signs of pulmonary edema on her chest x-ray and an elevated BNP. She will be given Lasix, nitroglycerin paste, and admitted to the hospitalist service. Dr. Toniann Fail agrees to admit.  Final Clinical Impressions(s) / ED Diagnoses   Final diagnoses:  None    New Prescriptions New Prescriptions   No medications on file     Geoffery Lyons, MD 10/19/15 [phone]

## 2015-10-19 NOTE — ED Notes (Signed)
Pt refused breathing treatment, asked if there is anything other than nicotine patch that may help her. Pt says no. Is sitting on side of bed. Also, made aware ordered meal tray, she responds "why did you do that? You didn't even know what I wanted".

## 2015-10-19 NOTE — ED Notes (Addendum)
Pt nitro paste removed, BP 124/80. C/o h/a. Cards NP reports okay to remove paste.

## 2015-10-19 NOTE — Progress Notes (Addendum)
Patient admitted to room 2W32 from ED. Patient oriented to room and call bell placed within reach. No questions or concerns from patient at this time.

## 2015-10-19 NOTE — ED Notes (Signed)
The pt reports that she  Feels a little better and her breathing may be also a little better  Waiting for a disposition

## 2015-10-19 NOTE — ED Notes (Signed)
Pt eating meal tray, given sprite. Husband at bedside.

## 2015-10-19 NOTE — Consult Note (Signed)
Cardiology Consult    Patient ID: April [name] MRN: [id number], DOB/AGE: Dec 26, 1979   Admit date: 10/19/2015 Date of Consult: 10/19/2015  Primary Physician: Jaclyn Shaggy, MD Primary Cardiologist: New Requesting Provider: Dr.Hobbs Reason for Consultation: CHF  Patient Profile    40 yo female with PMH of combined CHF, CKD IV, HTN, Migraines, tobacco abuse, and OSA who presented to with increasing dyspnea over the past 4 days.   Past Medical History   Past Medical History:  Diagnosis Date  . Anemia   . Asthma   . Cellulitis of right lower extremity 08/29/2015  . CHF (congestive heart failure) (HCC)   . Chronic kidney disease (CKD)   . COPD (chronic obstructive pulmonary disease) (HCC)   . Depression   . Hypertension   . Migraine    "a few times/month" (08/29/2015)  . On home oxygen therapy    "when needed" (08/29/2015)  . OSA (obstructive sleep apnea)    "don't wear mask" (08/29/2015)    Past Surgical History:  Procedure Laterality Date  . I&D EXTREMITY Right 09/03/2015   Procedure: IRRIGATION AND DEBRIDEMENT abcess LOWER LEG with complex closure;  Surgeon: Sheral Apley, MD;  Location: [facility] OR;  Service: Orthopedics;  Laterality: Right;  . NO PAST SURGERIES       Allergies  No Known Allergies  History of Present Illness    April [name] is a 40 female with PMH combined CHF, CKD IV, HTN, Migraines, tobacco abuse, and OSA. Reports she was followed by a cardiologist in [place] when she lived there, but has not seen anyone since moving here. She is currently seen by her PCP who Rx her lasix.   She currently lives in an appt and has to walk up 3 flights of stairs multiple times a day. Has noticed increased dyspnea over the past couple of months, but worsened in the past 4 days. Also reports PND and orthopnea. She presented to the [place] ED on 10/11 with the same symptoms and was admitted at that time for acute on chronic CHF. Left AMA that admission as  she was not allowed to smoke.   Presented back to the [facility] ED today with ongoing c/o of dyspnea. Denies any lower extremity edema or abd fullness. Reports she has been out of her lasix " a long time", and just has not had them refilled. Also reports taking her home blood pressure medications, but states that her "blood pressure is always high, no matter what she takes". She continues to smoke. In the ED her blood pressure was noted 200s systolic. Also reported a headache and minimally productive cough. Labs in the ED were significant for BNP 1892, Cr 3.34, Hgb 8.1 and CXR with interstitial edema with bibasilar atelectasis. She was given a dose of 40mg  IV lasix while in the ED with UOP of 500, along with her home blood pressure medications. Also given a dose of IV hydralazine with improvement in blood pressure.   Inpatient Medications    . albuterol      . carvedilol  25 mg Oral BID WC  . ferrous sulfate  325 mg Oral Daily  . furosemide  40 mg Intravenous BID  . heparin  5,000 Units Subcutaneous Q8H  . hydrALAZINE  50 mg Oral BID  . ipratropium-albuterol  3 mL Nebulization Q4H  . nicotine  21 mg Transdermal Daily  . sodium chloride flush  3 mL Intravenous Q12H  . topiramate  50 mg Oral BID  Family History    Family History  Problem Relation Age of Onset  . Diabetes Mother   . Hypertension Mother   . Cancer Father   . Diabetes Maternal Grandmother   . Cancer Maternal Grandfather   . Cancer Paternal Grandfather     Social History    Social History   Social History  . Marital status: Married    Spouse name: N/A  . Number of children: N/A  . Years of education: N/A   Occupational History  . Not on file.   Social History Main Topics  . Smoking status: Current Every Day Smoker    Packs/day: 0.60    Years: 20.00    Types: Cigarettes  . Smokeless tobacco: Never Used  . Alcohol use 1.8 oz/week    3 Cans of beer per week  . Drug use:     Types: "Crack" cocaine,  Marijuana     Comment: 08/29/2015 "not often"  . Sexual activity: Yes   Other Topics Concern  . Not on file   Social History Narrative  . No narrative on file     Review of Systems    General:  No chills, fever, night sweats or weight changes.  Cardiovascular: See HPI Dermatological: No rash, lesions/masses Respiratory: No cough, dyspnea Urologic: No hematuria, dysuria Abdominal:   No nausea, vomiting, diarrhea, bright red blood per rectum, melena, or hematemesis Neurologic:  No visual changes, wkns, changes in mental status. All other systems reviewed and are otherwise negative except as noted above.  Physical Exam    Blood pressure (!) 158/107, pulse 94, temperature 97.6 F (36.4 C), temperature source Oral, resp. rate 20, height 5\' 4"  (1.626 m), weight 180 lb (81.6 kg), last menstrual period 10/18/2015, SpO2 96 %.  General: Pleasant AA female, NAD Psych: Normal affect. Neuro: Alert and oriented X 3. Moves all extremities spontaneously. HEENT: Normal  Neck: Supple without bruits or JVD. Lungs:  Resp regular and unlabored, Slightly diminished with mild crackles. Heart: RRR no s3, s4, or murmurs. Abdomen: Soft, non-tender, non-distended, BS + x 4.  Extremities: No clubbing, cyanosis or edema. DP/PT/Radials 2+ and equal bilaterally.  Labs    Troponin [facility] of Care Test)  Recent Labs  10/19/15 0428  TROPIPOC 0.05    Recent Labs  10/16/15 1907  TROPONINI 0.14*   Lab Results  Component Value Date   WBC 6.9 10/19/2015   HGB 8.1 (L) 10/19/2015   HCT 26.3 (L) 10/19/2015   MCV 86.5 10/19/2015   PLT 299 10/19/2015    Recent Labs Lab 10/19/15 0439  NA 139  K 4.0  CL 110  CO2 20*  BUN 34*  CREATININE 3.34*  CALCIUM 8.1*  GLUCOSE 96   No results found for: CHOL, HDL, LDLCALC, TRIG Lab Results  Component Value Date   DDIMER 1.24 (H) 08/29/2015     Radiology Studies    Dg Chest 2 View  Result Date: 10/19/2015 CLINICAL DATA:  40 y/o F; complaint of  asthma flare up over the last 4-5 days. History of COPD, congestive heart failure, and asthma. EXAM: CHEST  2 VIEW COMPARISON:  10/16/2015 chest radiograph FINDINGS: Moderate cardiomegaly stable given projection and technique. Peripheral septal lines consistent interest edema. Linear bibasilar opacities probably represent atelectasis. No acute osseous abnormality. IMPRESSION: Cardiomegaly. Interstitial edema and bibasilar atelectasis increased from prior radiographs. Electronically Signed   By: Mitzi Hansen M.D.   On: 10/19/2015 05:23    ECG & Cardiac Imaging    EKG: SR  Echo: 01/04/15  Study Conclusions  - Left ventricle: The cavity size was normal. There was moderate   concentric hypertrophy. Systolic function was mildly reduced. The   estimated ejection fraction was in the range of 45% to 50%.   Diffuse hypokinesis. Features are consistent with a pseudonormal   left ventricular filling pattern, with concomitant abnormal   relaxation and increased filling pressure (grade 2 diastolic   dysfunction). Doppler parameters are consistent with elevated   ventricular end-diastolic filling pressure. - Aortic valve: Trileaflet; normal thickness leaflets. There was no   regurgitation. - Aortic root: The aortic root was normal in size. - Mitral valve: Mildly thickened leaflets . There was mild   regurgitation. - Left atrium: The atrium was moderately dilated. - Right ventricle: The cavity size was normal. Wall thickness was   normal. Systolic function was normal. - Tricuspid valve: There was moderate regurgitation. - Pulmonary arteries: Systolic pressure was moderately increased.   PA peak pressure: 47 mm Hg (S). - Inferior vena cava: The vessel was normal in size. - Pericardium, extracardiac: There was no pericardial effusion.  Assessment & Plan    40 yo female with PMH of combined CHF, CKD IV, HTN, Migraines, tobacco abuse, and OSA who presented to with increasing dyspnea over  the past 4 days.   1. Acute on Chronic combined CHF: Reports she has been out of her lasix for "awhile" and just not been able to get them refilled. Reports progressive symptoms for the past couple of months, but worsening in the last 4 days. Presented and admitted to [facility] but left AMA after not being able to smoke on 10/11. Labs this admission showed elevated BNP, and CXR with edema. Started on IV lasix in the ED with some UOP.  -- Will need diuresis with IV lasix, but need to monitor Cr as her baseline appears around 2.5-2.8. Cr 3.35 on admission. Sees Dr. Hyman Hopes with nephrology.  -- Check echo  2. HTN: States she has been taking her home blood pressure meds, but blood pressure in the ED showed systolic 200s. Improved after receiving PO meds with IV hydralazine.  -- Continue with BB and hydralazine. No ACEi or ARB given renal function  3. CKD IV: States she is followed by Dr. Hyman Hopes, says she is "close to dialysis". Admission Cr 3.35, which appears above her baseline. Will need to monitor with diuresis.   4. ID Anemia: Admission Hgb 8.1, appears to have a downward trend over the past couple of months. Denies any active bleeding. Monitor CBC. On home Iron.  April Coffin, April [name] Pager [phone] 10/19/2015, 9:14 AM   Patient seen and examined and history reviewed. Agree with above findings and plan. 40 yo BF with longstanding uncontrolled HTN, CKD stage IV, Tobacco abuse, OSA-untreated, and CHF. Presents with 4 day history of worsening SOB and PND. No increase in edema. No chest pain. Relates current problems from not taking her lasix. She is noncompliant with all aspects of her care. Eats a poor high sodium diet. Continues to smoke. Doesn't take her medications. States her BP is always high and medication doesn't help. Has history of CHF for last 2-3 years.  On exam she is obese. No JVD. She is hypertensive. Lungs with basilar crackles.  CV with positive S4. No murmur No edema.     CXR shows CHF.  Ecg shows NSR with LVH She is anemic with Hgb. 8.1. Creatinine 3.34. BNP is elevated  She has acute on chronic combined systolic  and diastolic CHF related to hypertensive heart disease. Recent exacerbation related to noncompliance with diuretic therapy and diet and to anemia. Agree with IV diuresis. BP should improve with correction in volume overload. Titrate Coreg, hydralazine, nitrates as tolerated. Will update Echo. May need Epogen for anemia. Recommend smoking cessation.  Long term prognosis looks very poor for this young woman unless she makes significant lifestyle modifications. She has very poor insight.   April [name], MDFACC 10/19/2015 11:13 AM

## 2015-10-19 NOTE — ED Notes (Signed)
Pt offered nicotine patch, refused.

## 2015-10-19 NOTE — Progress Notes (Signed)
CRITICAL VALUE ALERT  Critical value received:  Troponin 0.09  Date of notification:  10/19/15  Time of notification:    Critical value read back:Yes.    Nurse who received alert:  Tilman NeatA. Renesmay Nesbitt  MD notified (1st page):  Laverda PageLindsay Roberts  Time of first page:  1245  MD notified (2nd page):  Time of second page:  Responding MD: Laverda PageLindsay Roberts  Time MD responded:  1245

## 2015-10-19 NOTE — ED Notes (Signed)
To xray  Pt coughing intermittently she appears to be a little better  Less labored

## 2015-10-19 NOTE — H&P (Signed)
History and Physical    April [name] NWG:[id number] DOB: Mar 05, 1975 DOA: 10/19/2015  PCP: Jaclyn Shaggy, MD Patient coming from: home  Chief Complaint: sob  HPI: April [name] is a 40 y.o. female with medical history significant for chronic diastolic heart failure stage IV chronic kidney disease COPD depression hypertension migraines obstructive sleep apnea iron deficiency anemia asthma presents to the emergency Department chief complaint shortness of breath. Initial evaluation reveals acute on chronic diastolic heart failure with an elevated BNP respiratory distress.  Information is obtained from the patient and the chart. She reports 4 days ago she developed gradual worsening shortness of breath. She was diagnosed with heart failure several years ago has been on Lasix. She reports having run out of Lasix for "quite a while". She also has not had her blood pressure medicine. Of note she went [facility] 2 days ago left AMA after 3 hours in the emergency room so she could go outside and smoke. Currently she denies chest pain palpitations or lower extremity edema or orthopnea. She does endorse worsening cough and mild increased sputum production. She denies fever chills headache dizziness syncope or near-syncope. He denies abdominal pain nausea vomiting diaphoresis diarrhea constipation melena bright red blood per rectum. She denies dysuria hematuria frequency or urgency.  ED Course: In the emergency department is afebrile hypertensive tachypnea not hypoxic. Provided with IV Lasix nitroglycerin paste.  Review of Systems: As per HPI otherwise 10 point review of systems negative.   Ambulatory Status: She ambulates independently recent falls  Past Medical History:  Diagnosis Date  . Anemia   . Asthma   . Cellulitis of right lower extremity 08/29/2015  . CHF (congestive heart failure) (HCC)   . Chronic kidney disease (CKD)   . COPD (chronic obstructive pulmonary disease)  (HCC)   . Depression   . Hypertension   . Migraine    "a few times/month" (08/29/2015)  . On home oxygen therapy    "when needed" (08/29/2015)  . OSA (obstructive sleep apnea)    "don't wear mask" (08/29/2015)    Past Surgical History:  Procedure Laterality Date  . I&D EXTREMITY Right 09/03/2015   Procedure: IRRIGATION AND DEBRIDEMENT abcess LOWER LEG with complex closure;  Surgeon: Sheral Apley, MD;  Location: [facility] OR;  Service: Orthopedics;  Laterality: Right;  . NO PAST SURGERIES      Social History   Social History  . Marital status: Married    Spouse name: N/A  . Number of children: N/A  . Years of education: N/A   Occupational History  . Not on file.   Social History Main Topics  . Smoking status: Current Every Day Smoker    Packs/day: 0.60    Years: 20.00    Types: Cigarettes  . Smokeless tobacco: Never Used  . Alcohol use 1.8 oz/week    3 Cans of beer per week  . Drug use:     Types: "Crack" cocaine, Marijuana     Comment: 08/29/2015 "not often"  . Sexual activity: Yes   Other Topics Concern  . Not on file   Social History Narrative  . No narrative on file    No Known Allergies  Family History  Problem Relation Age of Onset  . Diabetes Mother   . Hypertension Mother   . Cancer Father   . Diabetes Maternal Grandmother   . Cancer Maternal Grandfather   . Cancer Paternal Grandfather     Prior to Admission medications   Medication Sig Start Date  End Date Taking? Authorizing Provider  albuterol (PROVENTIL HFA;VENTOLIN HFA) 108 (90 BASE) MCG/ACT inhaler Inhale 2 puffs into the lungs every 4 (four) hours as needed for wheezing or shortness of breath. 10/16/14  Yes Kristen N Ward, DO  albuterol (PROVENTIL) (2.5 MG/3ML) 0.083% nebulizer solution Take 2.5 mg by nebulization every 6 (six) hours as needed for wheezing or shortness of breath.   Yes Historical Provider, MD  beclomethasone (QVAR) 80 MCG/ACT inhaler Inhale 1 puff into the lungs 2 (two) times  daily. Patient taking differently: Inhale 2 puffs into the lungs 2 (two) times daily.  10/17/14  Yes Jaclyn ShaggyEnobong Amao, MD  carvedilol (COREG) 25 MG tablet Take 1 tablet (25 mg total) by mouth 2 (two) times daily with a meal. 12/25/14  Yes Enobong Amao, MD  diphenhydramine-acetaminophen (TYLENOL PM) 25-500 MG TABS tablet Take 1-2 tablets by mouth at bedtime as needed (for sleep).   Yes Historical Provider, MD  ferrous sulfate 325 (65 FE) MG tablet Take 1 tablet (325 mg total) by mouth daily. 08/29/15  Yes Gilda Creasehristopher J Pollina, MD  furosemide (LASIX) 40 MG tablet Take 1 tablet (40 mg total) by mouth 2 (two) times daily. Patient taking differently: Take 40 mg by mouth daily.  12/25/14  Yes Jaclyn ShaggyEnobong Amao, MD  hydrALAZINE (APRESOLINE) 50 MG tablet Take 1 tablet (50 mg total) by mouth 2 (two) times daily. 12/25/14  Yes Jaclyn ShaggyEnobong Amao, MD  oxymetazoline (AFRIN NASAL SPRAY) 0.05 % nasal spray Place 1 spray into right nostril 2 (two) times daily as needed for congestion. 04/05/15  Yes Audry Piliyler Mohr, PA-C  topiramate (TOPAMAX) 50 MG tablet Take 1 tablet (50 mg total) by mouth 2 (two) times daily. 10/17/14  Yes Jaclyn ShaggyEnobong Amao, MD  lisinopril (PRINIVIL,ZESTRIL) 20 MG tablet Take 1 tablet (20 mg total) by mouth daily. Patient not taking: Reported on 10/19/2015 04/05/15   Audry Piliyler Mohr, PA-C    Physical Exam: Vitals:   10/19/15 0626 10/19/15 0630 10/19/15 0700 10/19/15 0719  BP: (!) 197/123 (!) 222/140 (!) 214/113 (!) 214/114  Pulse: 85  86 90  Resp:      Temp: 97.6 F (36.4 C)     TempSrc: Oral     SpO2: 100%  100% 100%  Weight:      Height:         General:  Appears quite irritable but not uncomfortable. Argumentative wanting to go outside to smoke Eyes:  PERRL, EOMI, normal lids, iris ENT:  grossly normal hearing, lips & tongue, because membranes of her mouth are moist and pink Neck:  no LAD, masses or thyromegaly Cardiovascular:  RRR, no m/r/g. No LE edema.  Respiratory:   Normal respiratory effort. Breath  sounds with fine crackles bilaterally mid lobe hear no wheeze no rhonchi Abdomen:  soft, ntnd, positive bowel sounds Skin:  no rash or induration seen on limited exam Musculoskeletal:  grossly normal tone BUE/BLE, good ROM, no bony abnormality Psychiatric:  grossly normal mood and affect, speech fluent and appropriate, AOx3 Neurologic:  CN 2-12 grossly intact, moves all extremities in coordinated fashion, sensation intact.   Labs on Admission: I have personally reviewed following labs and imaging studies  CBC:  Recent Labs Lab 10/16/15 1439 10/19/15 0439  WBC 6.2 6.9  NEUTROABS 4.3 4.2  HGB 8.5* 8.1*  HCT 26.9* 26.3*  MCV 87.3 86.5  PLT 304 299   Basic Metabolic Panel:  Recent Labs Lab 10/16/15 1439 10/19/15 0439  NA 139 139  K 3.6 4.0  CL 111 110  CO2 20* 20*  GLUCOSE 92 96  BUN 28* 34*  CREATININE 2.88* 3.34*  CALCIUM 8.5* 8.1*   GFR: Estimated Creatinine Clearance: 23.2 mL/min (by C-G formula based on SCr of 3.34 mg/dL (H)). Liver Function Tests: No results for input(s): AST, ALT, ALKPHOS, BILITOT, PROT, ALBUMIN in the last 168 hours. No results for input(s): LIPASE, AMYLASE in the last 168 hours. No results for input(s): AMMONIA in the last 168 hours. Coagulation Profile: No results for input(s): INR, PROTIME in the last 168 hours. Cardiac Enzymes:  Recent Labs Lab 10/16/15 1907  TROPONINI 0.14*   BNP (last 3 results) No results for input(s): PROBNP in the last 8760 hours. HbA1C: No results for input(s): HGBA1C in the last 72 hours. CBG: No results for input(s): GLUCAP in the last 168 hours. Lipid Profile: No results for input(s): CHOL, HDL, LDLCALC, TRIG, CHOLHDL, LDLDIRECT in the last 72 hours. Thyroid Function Tests: No results for input(s): TSH, T4TOTAL, FREET4, T3FREE, THYROIDAB in the last 72 hours. Anemia Panel: No results for input(s): VITAMINB12, FOLATE, FERRITIN, TIBC, IRON, RETICCTPCT in the last 72 hours. Urine analysis:    Component  Value Date/Time   COLORURINE YELLOW 02/14/2015 1014   APPEARANCEUR CLEAR 02/14/2015 1014   LABSPEC 1.016 02/14/2015 1014   PHURINE 5.0 02/14/2015 1014   GLUCOSEU NEGATIVE 02/14/2015 1014   HGBUR SMALL (A) 02/14/2015 1014   BILIRUBINUR NEGATIVE 02/14/2015 1014   KETONESUR NEGATIVE 02/14/2015 1014   PROTEINUR 100 (A) 02/14/2015 1014   NITRITE NEGATIVE 02/14/2015 1014   LEUKOCYTESUR NEGATIVE 02/14/2015 1014    Creatinine Clearance: Estimated Creatinine Clearance: 23.2 mL/min (by C-G formula based on SCr of 3.34 mg/dL (H)).  Sepsis Labs: @LABRCNTIP (procalcitonin:4,lacticidven:4) )No results found for this or any previous visit (from the past 240 hour(s)).   Radiological Exams on Admission: Dg Chest 2 View  Result Date: 10/19/2015 CLINICAL DATA:  40 y/o F; complaint of asthma flare up over the last 4-5 days. History of COPD, congestive heart failure, and asthma. EXAM: CHEST  2 VIEW COMPARISON:  10/16/2015 chest radiograph FINDINGS: Moderate cardiomegaly stable given projection and technique. Peripheral septal lines consistent interest edema. Linear bibasilar opacities probably represent atelectasis. No acute osseous abnormality. IMPRESSION: Cardiomegaly. Interstitial edema and bibasilar atelectasis increased from prior radiographs. Electronically Signed   By: Mitzi Hansen M.D.   On: 10/19/2015 05:23    EKG: Independently reviewed.Sinus rhythm Biatrial enlargement Borderline right axis deviation Left ventricular hypertrophy Borderline prolonged QT interval  Assessment/Plan Principal Problem:   Acute on chronic diastolic HF (heart failure) (HCC) Active Problems:   Essential hypertension   Chronic kidney disease   Iron deficiency anemia   COPD (chronic obstructive pulmonary disease) (HCC)   #1. Acute on chronic diastolic heart failure. Reports running out of Lasix and suspect antihypertensives as well.  Echo done last year- Left ventricle: The cavity size was normal. There  was moderate concentric hypertrophy. Systolic function was mildly reduced. The estimated ejection fraction was in the range of 45% to 50%. Diffuse hypokinesis. Grade 2 diastolic dysfunction. Chest x-ray with cardiomegaly interstitial edema. BNP 1892. EKG as noted above. No chest pain. -Admit to telemetry -Lasix 40 mg IV twice a day -Obtain daily weights -Monitor intake and output -No ACE inhibitor due to chronic kidney disease -Continue home meds including beta blocker -Echocardiogram -Cardiology consult  #2. Hypertension. Uncontrolled. He reports having run out of Lasix and I suspect her other medications as well. Home medications include Coreg, Lasix, hydralazine. Reports recently taken off of lisinopril. -Resume  Coreg and hydralazine. -Lasix as noted above -Add when necessary IV hydralazine  3. Acute on chronic kidney disease stage IV. Creatinine 3.34. Chart review indicates baseline closer to 2.4. Clearly related to above. -Hold lisinopril -lasix as above -monitor urine output -monitor renal parameters  #4. Iron deficiency anemia. Setting of chronic disease. Hemoglobin 8.1. Chart review indicates a downward over the last several months. Review indicates ferritin and iron levels low in the past. -FOBT -Anemia panel -Continue supplement  #5. History of asthma. Pierce stable at baseline. Chest x-ray as noted above -Scheduled nebs -continue home inhaler -monitor oxygen saturation   DVT prophylaxis: heparin Code Status: full  Family Communication: husband at bedside  Disposition Plan: home  Consults called: cardiology Dr Thomasene Lot Admission status: inpatient    Gwenyth Bender MD [facility]  If 7PM-7AM, please contact night-coverage [email or web address] Password [email or web address]  10/19/2015, 7:40 AM

## 2015-10-19 NOTE — ED Notes (Signed)
Pt telling RN she is going to go out and smoke. Pt made aware she is not able to do this as her BP is very high. Pt told will talk with admitting team and CN about this.

## 2015-10-19 NOTE — ED Triage Notes (Addendum)
C/o asthma flare-up over the last 4-5 days.  [facility] and [facility] wanted her to stay but she declined.  Using Qvar and Albuterol at home without relief.  Neb started at triage.

## 2015-10-19 NOTE — ED Notes (Signed)
The pt is having difficulty breathing  hhn going

## 2015-10-19 NOTE — ED Notes (Signed)
Spoke with admitting about BP, will order meds before pt goes upstairs.

## 2015-10-19 NOTE — ED Notes (Signed)
Pt had lasix she is on bedside commode

## 2015-10-20 ENCOUNTER — Inpatient Hospital Stay (HOSPITAL_COMMUNITY): Payer: PRIVATE HEALTH INSURANCE

## 2015-10-20 DIAGNOSIS — N189 Chronic kidney disease, unspecified: Secondary | ICD-10-CM

## 2015-10-20 DIAGNOSIS — I119 Hypertensive heart disease without heart failure: Secondary | ICD-10-CM | POA: Diagnosis present

## 2015-10-20 DIAGNOSIS — N183 Chronic kidney disease, stage 3 (moderate): Secondary | ICD-10-CM

## 2015-10-20 DIAGNOSIS — I10 Essential (primary) hypertension: Secondary | ICD-10-CM

## 2015-10-20 DIAGNOSIS — N184 Chronic kidney disease, stage 4 (severe): Secondary | ICD-10-CM

## 2015-10-20 DIAGNOSIS — I509 Heart failure, unspecified: Secondary | ICD-10-CM

## 2015-10-20 LAB — URINALYSIS, ROUTINE W REFLEX MICROSCOPIC
BILIRUBIN URINE: NEGATIVE
GLUCOSE, UA: NEGATIVE mg/dL
Hgb urine dipstick: NEGATIVE
KETONES UR: NEGATIVE mg/dL
Leukocytes, UA: NEGATIVE
NITRITE: NEGATIVE
PH: 6 (ref 5.0–8.0)
PROTEIN: 100 mg/dL — AB
Specific Gravity, Urine: 1.016 (ref 1.005–1.030)

## 2015-10-20 LAB — ECHOCARDIOGRAM COMPLETE
HEIGHTINCHES: 64 in
WEIGHTICAEL: 2784 [oz_av]

## 2015-10-20 LAB — CBC
HCT: 26.7 % — ABNORMAL LOW (ref 36.0–46.0)
HEMOGLOBIN: 8.3 g/dL — AB (ref 12.0–15.0)
MCH: 26.6 pg (ref 26.0–34.0)
MCHC: 31.1 g/dL (ref 30.0–36.0)
MCV: 85.6 fL (ref 78.0–100.0)
Platelets: 302 10*3/uL (ref 150–400)
RBC: 3.12 MIL/uL — AB (ref 3.87–5.11)
RDW: 17.8 % — ABNORMAL HIGH (ref 11.5–15.5)
WBC: 5.4 10*3/uL (ref 4.0–10.5)

## 2015-10-20 LAB — RAPID URINE DRUG SCREEN, HOSP PERFORMED
Amphetamines: NOT DETECTED
BARBITURATES: NOT DETECTED
Benzodiazepines: NOT DETECTED
COCAINE: POSITIVE — AB
Opiates: NOT DETECTED
Tetrahydrocannabinol: NOT DETECTED

## 2015-10-20 LAB — BASIC METABOLIC PANEL
ANION GAP: 11 (ref 5–15)
BUN: 31 mg/dL — ABNORMAL HIGH (ref 6–20)
CALCIUM: 8.5 mg/dL — AB (ref 8.9–10.3)
CO2: 22 mmol/L (ref 22–32)
Chloride: 107 mmol/L (ref 101–111)
Creatinine, Ser: 3.2 mg/dL — ABNORMAL HIGH (ref 0.44–1.00)
GFR, EST AFRICAN AMERICAN: 20 mL/min — AB (ref >60)
GFR, EST NON AFRICAN AMERICAN: 17 mL/min — AB (ref >60)
Glucose, Bld: 64 mg/dL — ABNORMAL LOW (ref 65–99)
POTASSIUM: 3.5 mmol/L (ref 3.5–5.1)
Sodium: 140 mmol/L (ref 135–145)

## 2015-10-20 LAB — SODIUM, URINE, RANDOM: SODIUM UR: 87 mmol/L

## 2015-10-20 LAB — URINE MICROSCOPIC-ADD ON

## 2015-10-20 MED ORDER — HYDRALAZINE HCL 50 MG PO TABS
100.0000 mg | ORAL_TABLET | Freq: Three times a day (TID) | ORAL | Status: DC
  Administered 2015-10-20 – 2015-10-22 (×7): 100 mg via ORAL
  Filled 2015-10-20 (×7): qty 2

## 2015-10-20 MED ORDER — CARVEDILOL 25 MG PO TABS
50.0000 mg | ORAL_TABLET | Freq: Two times a day (BID) | ORAL | Status: DC
  Administered 2015-10-20 – 2015-10-22 (×4): 50 mg via ORAL
  Filled 2015-10-20 (×4): qty 2

## 2015-10-20 MED ORDER — SODIUM CHLORIDE 0.9 % IV SOLN
125.0000 mg | Freq: Once | INTRAVENOUS | Status: AC
  Administered 2015-10-20: 125 mg via INTRAVENOUS
  Filled 2015-10-20: qty 10

## 2015-10-20 MED ORDER — BUTALBITAL-APAP-CAFFEINE 50-325-40 MG PO TABS
1.0000 | ORAL_TABLET | ORAL | Status: DC | PRN
  Administered 2015-10-20: 1 via ORAL
  Filled 2015-10-20: qty 1

## 2015-10-20 MED ORDER — TECHNETIUM TC 99M DIETHYLENETRIAME-PENTAACETIC ACID: 30.0000 | Freq: Once | INTRAVENOUS | Status: DC | PRN

## 2015-10-20 MED ORDER — TECHNETIUM TO 99M ALBUMIN AGGREGATED
3.0000 | Freq: Once | INTRAVENOUS | Status: AC | PRN
  Administered 2015-10-20: 3 via INTRAVENOUS

## 2015-10-20 NOTE — Progress Notes (Signed)
  Echocardiogram 2D Echocardiogram has been performed.  April SavoyCasey N Guida [name] 10/20/2015, 4:49 PM

## 2015-10-20 NOTE — Progress Notes (Signed)
[name] Date of Encounter: 10/20/2015  [facility]  Hospital Problem List     Principal Problem:   Acute on chronic diastolic HF (heart failure) (HCC) Active Problems:   Essential hypertension   Chronic kidney disease   Iron deficiency anemia   COPD (chronic obstructive pulmonary disease) (HCC)   CHF (congestive heart failure) (HCC)     Subjective   Feeling better. Less SOB. No chest pain.  Inpatient Medications    Scheduled Meds: . carvedilol  25 mg Oral BID WC  . ferrous sulfate  325 mg Oral Daily  . furosemide  40 mg Intravenous BID  . heparin  5,000 Units Subcutaneous Q8H  . hydrALAZINE  50 mg Oral BID  . isosorbide mononitrate  30 mg Oral Daily  . nicotine  21 mg Transdermal Daily  . sodium chloride flush  3 mL Intravenous Q12H  . topiramate  50 mg Oral BID   Continuous Infusions:   PRN Meds: sodium chloride, acetaminophen **AND** diphenhydrAMINE, acetaminophen, ALPRAZolam, hydrALAZINE, ipratropium-albuterol, ondansetron (ZOFRAN) IV, oxymetazoline, sodium chloride flush, traMADol   Vital Signs    Vitals:   10/19/15 1758 10/19/15 1851 10/19/15 2012 10/20/15 0539  BP: (!) 172/84 (!) 183/96 (!) 150/86 (!) 182/91  Pulse: 87 86 80 78  Resp: 18  18 18   Temp: 97.8 F (36.6 C)  98.1 F (36.7 C) 98 F (36.7 C)  TempSrc: Oral  Oral Oral  SpO2: 100%  100% 100%  Weight:    174 lb (78.9 kg)  Height:       No intake or output data in the 24 hours ending 10/20/15 0810 Filed Weights   10/19/15 0341 10/20/15 0539  Weight: 180 lb (81.6 kg) 174 lb (78.9 kg)    Physical Exam   GEN: Well nourished, well developed, in no acute distress.  HEENT: Grossly normal.  Neck: Supple, no JVD, carotid bruits, or masses. Cardiac: RRR, no murmurs, rubs, or gallops. No clubbing, cyanosis, edema.  Radials/DP/PT 2+ and equal bilaterally.  Respiratory:  Respirations regular and unlabored, clear to auscultation bilaterally. Mildly  diminished BS in bases GI: Soft, nontender, nondistended, BS + x 4. MS: no deformity or atrophy. Skin: warm and dry, no rash. Neuro:  Strength and sensation are intact. Psych: AAOx3.  Normal affect.  Labs    CBC  Recent Labs  10/19/15 0439 10/20/15 0257  WBC 6.9 5.4  NEUTROABS 4.2  --   HGB 8.1* 8.3*  HCT 26.3* 26.7*  MCV 86.5 85.6  PLT 299 302   Basic Metabolic Panel  Recent Labs  10/19/15 0439 10/19/15 0732 10/20/15 0257  NA 139  --  140  K 4.0  --  3.5  CL 110  --  107  CO2 20*  --  22  GLUCOSE 96  --  64*  BUN 34*  --  31*  CREATININE 3.34* 3.35* 3.20*  CALCIUM 8.1*  --  8.5*   Liver Function Tests No results for input(s): AST, ALT, ALKPHOS, BILITOT, PROT, ALBUMIN in the last 72 hours. No results for input(s): LIPASE, AMYLASE in the last 72 hours. Cardiac Enzymes  Recent Labs  10/19/15 1036  TROPONINI 0.09*   BNP Invalid input(s): POCBNP D-Dimer No results for input(s): DDIMER in the last 72 hours. Hemoglobin A1C No results for input(s): HGBA1C in the last 72 hours. Fasting Lipid Panel No results for input(s): CHOL, HDL, LDLCALC, TRIG, CHOLHDL, LDLDIRECT in the last 72 hours. Thyroid Function Tests No results for input(s):  TSH, T4TOTAL, T3FREE, THYROIDAB in the last 72 hours.  Invalid input(s): FREET3  Telemetry    NSR - Personally Reviewed  ECG    10/16/15- NSR with [facility] - Personally Reviewed  Radiology    Dg Chest 2 View  Result Date: 10/19/2015 CLINICAL DATA:  40 y/o F; complaint of asthma flare up over the last 4-5 days. History of COPD, congestive heart failure, and asthma. EXAM: CHEST  2 VIEW COMPARISON:  10/16/2015 chest radiograph FINDINGS: Moderate cardiomegaly stable given projection and technique. Peripheral septal lines consistent interest edema. Linear bibasilar opacities probably represent atelectasis. No acute osseous abnormality. IMPRESSION: Cardiomegaly. Interstitial edema and bibasilar atelectasis increased from prior  radiographs. Electronically Signed   By: Mitzi Hansen M.D.   On: 10/19/2015 05:23    Cardiac Studies   Echo is pending  Patient Profile     40 yo female with PMH of combined CHF, CKD IV, HTN, Migraines, tobacco abuse, and OSA who presented to with increasing dyspnea over the past 4 days.   Assessment & Plan    1. Acute on chronic combined systolic/diastolic CHF. Echo is pending. I/O negative 500 cc but I suspect is incomplete. Weight is down 6 lbs. Renal function is stable. I would continue IV lasix one more day then transition to po. Stressed importance of dietary and medication compliance.  2. HTN with hypertensive heart disease. Still elevated. Will increase Coreg to 50 mg bid. Can titrate hydralazine and nitrates as tolerated. 3. CKD stage IV. Stable  4. Iron deficiency anemia. Replete iron. Consider Epogen.  5. Medical and dietary noncompliance 6. Tobacco abuse- recommend smoking cessation.  Signed, Lucrecia Mcphearson [place], MD  10/20/2015, 8:10 AM

## 2015-10-20 NOTE — Progress Notes (Addendum)
[facility] PROGRESS NOTE  Judia Arnott ZOX:[id number] DOB: [birth year] DOA: 10/19/2015   PCP: Jaclyn Shaggy, MD     Assessment/Plan: Principal Problem:   Acute on chronic diastolic HF (heart failure) (HCC) Active Problems:   Essential hypertension   Chronic kidney disease   Iron deficiency anemia   COPD (chronic obstructive pulmonary disease) (HCC)   CHF (congestive heart failure) (HCC)   Hypertensive heart disease   Lowanda Cashaw is a 40 y.o. female with a Past Medical History significant for anemia, asthma, COPD, htn, CHF who presents with SOB due to CHF exacerbation.Initial evaluation reveals acute on chronic diastolic heart failure with an elevated BNP respiratory distress. Cardiology consulted  Assessment and plan #1. Acute on chronic systolic/diastolic heart failure. Reports running out of Lasix and suspect antihypertensives as well.  Echo done last year- Left ventricle:45% to 50%  . Chest x-ray with cardiomegaly interstitial edema. BNP 1892. EKG as noted above. No chest pain. Continue telemetry -Lasix 40 mg IV twice a day,continue IV lasix one more day then transition to po - 180>174 lbs  -Monitor intake and output -No ACE inhibitor due to chronic kidney disease -Continue home meds including beta blocker -Repeat Echocardiogram VQ scan to rule out PE given multiple recent admissions  #2. Hypertension. Uncontrolled. He reports having run out of Lasix and I suspect her other medications as well. Home medications include Coreg, Lasix, hydralazine. Reports recently taken off of lisinopril. -Resume Coreg and increased dose of hydralazine to 100 mg 3 times a day -Lasix as noted above,No ACEi or ARB given renal function -Add when necessary IV hydralazine  3. Acute on chronic kidney disease stage IV. Creatinine 3.34. Baseline around 2.3-2.5 Chart review indicates baseline closer to 2.4. Clearly related to above. -Hold lisinopril -lasix as  above followed by Dr. Hyman Hopes   #4. Iron deficiency anemia. Baseline hemoglobin around 11. Setting of chronic disease. Hemoglobin 8.1. Chart review indicates a downward over the last several months. Review indicates ferritin and iron levels low in the past. -FOBT -Anemia panel-consistent with iron deficiency -Continue supplement Administer IV iron this admission  #5. History of asthma. /COPD O2 dependent. Chest x-ray as noted above -Scheduled nebs -continue home inhaler -monitor oxygen saturation Nicotine patch due to history of nicotine dependence    DVT prophylaxsis heparin  Code Status:  Full code     Family Communication: Discussed in detail with the patient, all imaging results, lab results explained to the patient   Disposition Plan:  As per cardiology recommendations      Consultants:  Cardiology  Procedures:  None  Antibiotics: Anti-infectives    None         HPI/Subjective: Sob improved ,No chest pain.  Objective: Vitals:   10/19/15 1758 10/19/15 1851 10/19/15 2012 10/20/15 0539  BP: (!) 172/84 (!) 183/96 (!) 150/86 (!) 182/91  Pulse: 87 86 80 78  Resp: 18  18 18   Temp: 97.8 F (36.6 C)  98.1 F (36.7 C) 98 F (36.7 C)  TempSrc: Oral  Oral Oral  SpO2: 100%  100% 100%  Weight:    78.9 kg (174 lb)  Height:        Intake/Output Summary (Last 24 hours) at 10/20/15 0843 Last data filed at 10/20/15 0812  Gross per 24 hour  Intake              240 ml  Output  0 ml  Net              240 ml    Exam:  Examination: General: Pleasant AA female, NAD Psych: Normal affect. Neuro: Alert and oriented X 3. Moves all extremities spontaneously. HEENT: Normal           Neck: Supple without bruits or JVD. Lungs:  Resp regular and unlabored, Slightly diminished with mild crackles. Heart: RRR no s3, s4, or murmurs. Abdomen: Soft, non-tender, non-distended, BS + x 4.  Extremities: No clubbing, cyanosis or edema. DP/PT/Radials 2+ and  equal bilaterally.    Data Reviewed: I have personally reviewed following labs and imaging studies  Micro Results No results found for this or any previous visit (from the past 240 hour(s)).  Radiology Reports Dg Chest 2 View  Result Date: 10/19/2015 CLINICAL DATA:  40 y/o F; complaint of asthma flare up over the last 4-5 days. History of COPD, congestive heart failure, and asthma. EXAM: CHEST  2 VIEW COMPARISON:  10/16/2015 chest radiograph FINDINGS: Moderate cardiomegaly stable given projection and technique. Peripheral septal lines consistent interest edema. Linear bibasilar opacities probably represent atelectasis. No acute osseous abnormality. IMPRESSION: Cardiomegaly. Interstitial edema and bibasilar atelectasis increased from prior radiographs. Electronically Signed   By: Mitzi HansenLance  Furusawa-Stratton M.D.   On: 10/19/2015 05:23   Dg Chest 2 View  Result Date: 10/16/2015 CLINICAL DATA:  Cough and shortness of breath for 2 days. History of asthma and CHF EXAM: CHEST  2 VIEW COMPARISON:  10/16/2015 FINDINGS: The heart is enlarged and stable since earlier chest film but definitely enlarged since the prior chest x-ray from 02/14/2015. There is interstitial pulmonary edema, peribronchial thickening and streaky areas of atelectasis. No large pleural effusion. Small amount of fluid is noted in the right major fissure. The bony thorax is intact. IMPRESSION: Cardiac enlargement, central vascular congestion and interstitial pulmonary edema. No all pleural effusion or focal infiltrate. Electronically Signed   By: Rudie MeyerP.  Gallerani M.D.   On: 10/16/2015 16:04   Dg Chest Port 1 View  Result Date: 10/16/2015 CLINICAL DATA:  Asthma with shortness of breath, wheezing, and chest tightness. EXAM: PORTABLE CHEST 1 VIEW COMPARISON:  02/14/2015 FINDINGS: AP semi upright chest obtained 1407 hours. The cardio pericardial silhouette is enlarged. Pulmonary vascular congestion with bilateral hazy open opacity over the  lungs. Edema is a concern. Contribution to appearance of overlying soft tissue is suspected. The visualized bony structures of the thorax are intact. Telemetry leads overlie the chest. IMPRESSION: Cardiopericardial silhouette is enlarged. Pericardial effusion not excluded. Vascular congestion with probable interstitial pulmonary edema. Dedicated upright PA and lateral chest x-ray would probably prove helpful to better characterize, when the patient is able. Electronically Signed   By: Kennith CenterEric  Mansell M.D.   On: 10/16/2015 14:27     CBC  Recent Labs Lab 10/16/15 1439 10/19/15 0439 10/20/15 0257  WBC 6.2 6.9 5.4  HGB 8.5* 8.1* 8.3*  HCT 26.9* 26.3* 26.7*  PLT 304 299 302  MCV 87.3 86.5 85.6  MCH 27.6 26.6 26.6  MCHC 31.6 30.8 31.1  RDW 18.3* 17.9* 17.8*  LYMPHSABS 1.2 1.9  --   MONOABS 0.4 0.3  --   EOSABS 0.4 0.4  --   BASOSABS 0.0 0.1  --     Chemistries   Recent Labs Lab 10/16/15 1439 10/19/15 0439 10/19/15 0732 10/20/15 0257  NA 139 139  --  140  K 3.6 4.0  --  3.5  CL 111 110  --  107  CO2 20* 20*  --  22  GLUCOSE 92 96  --  64*  BUN 28* 34*  --  31*  CREATININE 2.88* 3.34* 3.35* 3.20*  CALCIUM 8.5* 8.1*  --  8.5*   ------------------------------------------------------------------------------------------------------------------ estimated creatinine clearance is 23.8 mL/min (by C-G formula based on SCr of 3.2 mg/dL (H)). ------------------------------------------------------------------------------------------------------------------ No results for input(s): HGBA1C in the last 72 hours. ------------------------------------------------------------------------------------------------------------------ No results for input(s): CHOL, HDL, LDLCALC, TRIG, CHOLHDL, LDLDIRECT in the last 72 hours. ------------------------------------------------------------------------------------------------------------------ No results for input(s): TSH, T4TOTAL, T3FREE, THYROIDAB in the  last 72 hours.  Invalid input(s): FREET3 ------------------------------------------------------------------------------------------------------------------  Recent Labs  10/19/15 1036  VITAMINB12 511  FOLATE 9.2  FERRITIN 41  TIBC 302  IRON 25*  RETICCTPCT 3.2*    Coagulation profile No results for input(s): INR, PROTIME in the last 168 hours.  No results for input(s): DDIMER in the last 72 hours.  Cardiac Enzymes  Recent Labs Lab 10/16/15 1907 10/19/15 1036  TROPONINI 0.14* 0.09*   ------------------------------------------------------------------------------------------------------------------ Invalid input(s): POCBNP   CBG: No results for input(s): GLUCAP in the last 168 hours.     Studies: Dg Chest 2 View  Result Date: 10/19/2015 CLINICAL DATA:  40 y/o F; complaint of asthma flare up over the last 4-5 days. History of COPD, congestive heart failure, and asthma. EXAM: CHEST  2 VIEW COMPARISON:  10/16/2015 chest radiograph FINDINGS: Moderate cardiomegaly stable given projection and technique. Peripheral septal lines consistent interest edema. Linear bibasilar opacities probably represent atelectasis. No acute osseous abnormality. IMPRESSION: Cardiomegaly. Interstitial edema and bibasilar atelectasis increased from prior radiographs. Electronically Signed   By: Mitzi Hansen M.D.   On: 10/19/2015 05:23      No results found for: HGBA1C Lab Results  Component Value Date   CREATININE 3.20 (H) 10/20/2015       Scheduled Meds: . carvedilol  50 mg Oral BID WC  . ferrous sulfate  325 mg Oral Daily  . furosemide  40 mg Intravenous BID  . heparin  5,000 Units Subcutaneous Q8H  . hydrALAZINE  100 mg Oral Q8H  . isosorbide mononitrate  30 mg Oral Daily  . nicotine  21 mg Transdermal Daily  . sodium chloride flush  3 mL Intravenous Q12H  . topiramate  50 mg Oral BID   Continuous Infusions:    LOS: 1 day    Time spent: >30 MINS     [facility]  [facility] Pager [phone]. If 7PM-7AM, please contact night-coverage at [email or web address], password [facility] 10/20/2015, 8:43 AM  LOS: 1 day

## 2015-10-21 ENCOUNTER — Inpatient Hospital Stay (HOSPITAL_COMMUNITY): Payer: PRIVATE HEALTH INSURANCE

## 2015-10-21 DIAGNOSIS — I509 Heart failure, unspecified: Secondary | ICD-10-CM

## 2015-10-21 LAB — COMPREHENSIVE METABOLIC PANEL
ALK PHOS: 61 U/L (ref 38–126)
ALT: 15 U/L (ref 14–54)
ANION GAP: 9 (ref 5–15)
AST: 18 U/L (ref 15–41)
Albumin: 3.3 g/dL — ABNORMAL LOW (ref 3.5–5.0)
BILIRUBIN TOTAL: 0.4 mg/dL (ref 0.3–1.2)
BUN: 33 mg/dL — AB (ref 6–20)
CALCIUM: 8.5 mg/dL — AB (ref 8.9–10.3)
CO2: 24 mmol/L (ref 22–32)
Chloride: 107 mmol/L (ref 101–111)
Creatinine, Ser: 3.62 mg/dL — ABNORMAL HIGH (ref 0.44–1.00)
GFR calc Af Amer: 17 mL/min — ABNORMAL LOW (ref >60)
GFR, EST NON AFRICAN AMERICAN: 15 mL/min — AB (ref >60)
Glucose, Bld: 90 mg/dL (ref 65–99)
POTASSIUM: 3.9 mmol/L (ref 3.5–5.1)
Sodium: 140 mmol/L (ref 135–145)
TOTAL PROTEIN: 6.2 g/dL — AB (ref 6.5–8.1)

## 2015-10-21 LAB — UREA NITROGEN, URINE: UREA NITROGEN UR: 430 mg/dL

## 2015-10-21 MED ORDER — ZOLPIDEM TARTRATE 5 MG PO TABS
5.0000 mg | ORAL_TABLET | Freq: Every evening | ORAL | Status: DC | PRN
  Administered 2015-10-21: 5 mg via ORAL
  Filled 2015-10-21: qty 1

## 2015-10-21 MED ORDER — FUROSEMIDE 40 MG PO TABS
40.0000 mg | ORAL_TABLET | Freq: Two times a day (BID) | ORAL | Status: DC
Start: 2015-10-21 — End: 2015-10-22
  Administered 2015-10-21 – 2015-10-22 (×3): 40 mg via ORAL
  Filled 2015-10-21: qty 2
  Filled 2015-10-21 (×2): qty 1

## 2015-10-21 MED ORDER — HYDROMORPHONE HCL 1 MG/ML IJ SOLN
2.0000 mg | Freq: Once | INTRAMUSCULAR | Status: AC
  Administered 2015-10-21: 2 mg via INTRAVENOUS
  Filled 2015-10-21: qty 2

## 2015-10-21 MED ORDER — AMLODIPINE BESYLATE 5 MG PO TABS
5.0000 mg | ORAL_TABLET | Freq: Every day | ORAL | Status: DC
  Administered 2015-10-21 – 2015-10-22 (×2): 5 mg via ORAL
  Filled 2015-10-21 (×2): qty 1

## 2015-10-21 NOTE — Progress Notes (Addendum)
[facility] PROGRESS NOTE  April [name] ZOX:[id number] DOB: 22-Apr-1975 DOA: 10/19/2015   PCP: April Shaggy, MD     Assessment/Plan: Principal Problem:   Acute on chronic diastolic HF (heart failure) (HCC) Active Problems:   Essential hypertension   Chronic kidney disease   Iron deficiency anemia   COPD (chronic obstructive pulmonary disease) (HCC)   CHF (congestive heart failure) (HCC)   Hypertensive heart disease   April [name] is a 40 y.o. female with a Past Medical History significant for anemia, asthma, COPD, htn, CHF who presents with SOB due to CHF exacerbation.Initial evaluation reveals acute on chronic diastolic heart failure with an elevated BNP respiratory distress. Cardiology consulted  Assessment and plan #1. Acute on chronic systolic/diastolic heart failure. Reports running out of Lasix and suspect antihypertensives as well.  Echo done last year- Left ventricle:45% to 50%  . Chest x-ray with cardiomegaly interstitial edema. BNP 1892. EKG as noted above. No chest pain. Continue telemetry -Lasix 40 mg IV twice a day, switched over to Lasix by mouth- 180>174>172 lbs  -Monitor intake and output -No ACE inhibitor due to chronic kidney disease -Continue home meds including beta blocker -Repeat Echocardiogram shows EF of 45%, diffuse hypokinesis, VQ scan negative   #2. Hypertension. Uncontrolled.  Home medications include Coreg, Lasix, hydralazine. Reports recently taken off of lisinopril. Now on Coreg Lasix and hydralazine -Lasix as noted above,No ACEi or ARB given renal function -Add when necessary IV hydralazine  3. Acute on chronic kidney disease stage IV. Creatinine increasing, now 3.62. Baseline around 2.3-2.5 Chart review indicates baseline closer to 2.4. Clearly related to above. -Hold lisinopril -lasix as above followed by Dr. Hyman Hopes, may need nephrology evaluation of creatinine continues to increase   #4. Iron deficiency  anemia. Baseline hemoglobin around 11. Setting of chronic disease. Hemoglobin 8.1. Chart review indicates a downward over the last several months. Review indicates ferritin and iron levels low in the past. -FOBT -Anemia panel-consistent with iron deficiency -Continue supplement Received IV iron this admission  #5. History of asthma. /COPD O2 dependent. Chest x-ray as noted above -Scheduled nebs -continue home inhaler -monitor oxygen saturation Nicotine patch due to history of nicotine dependence  #6-headache Has a history of migraines, on Topamax Started on Fioricet, no relief therefore ordered 1 dose of Dilaudid and ordered a CT of the head without contrast  DVT prophylaxsis heparin  Code Status:  Full code     Family Communication: Discussed in detail with the patient, all imaging results, lab results explained to the patient   Disposition Plan:  As per cardiology recommendations      Consultants:  Cardiology  Procedures:  None  Antibiotics: Anti-infectives    None         HPI/Subjective:  Still complaining of a headache  Objective: Vitals:   10/20/15 1650 10/20/15 1744 10/20/15 2118 10/21/15 0615  BP: (!) 181/105 (!) 146/91 (!) 141/81 (!) 167/91  Pulse: 76 73 79 78  Resp:   18 18  Temp:   97.7 F (36.5 C) 97.5 F (36.4 C)  TempSrc:   Oral Oral  SpO2:   100% 95%  Weight:    78.1 kg (172 lb 1.6 oz)  Height:        Intake/Output Summary (Last 24 hours) at 10/21/15 0910 Last data filed at 10/20/15 1812  Gross per 24 hour  Intake              350 ml  Output  0 ml  Net              350 ml    Exam:  Examination: General: Pleasant AA female, NAD Psych: Normal affect. Neuro: Alert and oriented X 3. Moves all extremities spontaneously. HEENT: Normal           Neck: Supple without bruits or JVD. Lungs:  Resp regular and unlabored, Slightly diminished with mild crackles. Heart: RRR no s3, s4, or murmurs. Abdomen: Soft,  non-tender, non-distended, BS + x 4.  Extremities: No clubbing, cyanosis or edema. DP/PT/Radials 2+ and equal bilaterally.    Data Reviewed: I have personally reviewed following labs and imaging studies  Micro Results No results found for this or any previous visit (from the past 240 hour(s)).  Radiology Reports Dg Chest 2 View  Result Date: 10/20/2015 CLINICAL DATA:  Cough for 4 days.  No chest pain. EXAM: CHEST  2 VIEW COMPARISON:  October 19, 2015 FINDINGS: Stable cardiomegaly. No other interval changes or acute abnormalities. IMPRESSION: Stable cardiomegaly.  No acute abnormality. Electronically Signed   By: April [name] M.D   On: 10/20/2015 12:40   Dg Chest 2 View  Result Date: 10/19/2015 CLINICAL DATA:  40 y/o F; complaint of asthma flare up over the last 4-5 days. History of COPD, congestive heart failure, and asthma. EXAM: CHEST  2 VIEW COMPARISON:  10/16/2015 chest radiograph FINDINGS: Moderate cardiomegaly stable given projection and technique. Peripheral septal lines consistent interest edema. Linear bibasilar opacities probably represent atelectasis. No acute osseous abnormality. IMPRESSION: Cardiomegaly. Interstitial edema and bibasilar atelectasis increased from prior radiographs. Electronically Signed   By: April [name] M.D.   On: 10/19/2015 05:23   Dg Chest 2 View  Result Date: 10/16/2015 CLINICAL DATA:  Cough and shortness of breath for 2 days. History of asthma and CHF EXAM: CHEST  2 VIEW COMPARISON:  10/16/2015 FINDINGS: The heart is enlarged and stable since earlier chest film but definitely enlarged since the prior chest x-ray from 02/14/2015. There is interstitial pulmonary edema, peribronchial thickening and streaky areas of atelectasis. No large pleural effusion. Small amount of fluid is noted in the right major fissure. The bony thorax is intact. IMPRESSION: Cardiac enlargement, central vascular congestion and interstitial pulmonary edema. No all  pleural effusion or focal infiltrate. Electronically Signed   By: April [name] M.D.   On: 10/16/2015 16:04   Nm Pulmonary Perf And Vent  Result Date: 10/20/2015 CLINICAL DATA:  Evaluate for PE.  Shortness of breath. EXAM: NUCLEAR MEDICINE VENTILATION - PERFUSION LUNG SCAN TECHNIQUE: Ventilation images were obtained in multiple projections using inhaled aerosol Tc-25m DTPA. Perfusion images were obtained in multiple projections after intravenous injection of Tc-38m MAA. RADIOPHARMACEUTICALS:  31.0 mCi Technetium-69m DTPA aerosol inhalation and 3.1 mCi Technetium-31m MAA IV COMPARISON:  Chest x-ray from earlier today FINDINGS: Ventilation: No focal ventilation defect. Perfusion: No wedge shaped peripheral perfusion defects to suggest acute pulmonary embolism. IMPRESSION: No evidence of pulmonary embolus.  Normal V/Q scan. Electronically Signed   By: April [name] M.D   On: 10/20/2015 13:06   Dg Chest Port 1 View  Result Date: 10/16/2015 CLINICAL DATA:  Asthma with shortness of breath, wheezing, and chest tightness. EXAM: PORTABLE CHEST 1 VIEW COMPARISON:  02/14/2015 FINDINGS: AP semi upright chest obtained 1407 hours. The cardio pericardial silhouette is enlarged. Pulmonary vascular congestion with bilateral hazy open opacity over the lungs. Edema is a concern. Contribution to appearance of overlying soft tissue is suspected. The visualized bony structures of  the thorax are intact. Telemetry leads overlie the chest. IMPRESSION: Cardiopericardial silhouette is enlarged. Pericardial effusion not excluded. Vascular congestion with probable interstitial pulmonary edema. Dedicated upright PA and lateral chest x-ray would probably prove helpful to better characterize, when the patient is able. Electronically Signed   By: Kennith CenterEric  Mansell M.D.   On: 10/16/2015 14:27     CBC  Recent Labs Lab 10/16/15 1439 10/19/15 0439 10/20/15 0257  WBC 6.2 6.9 5.4  HGB 8.5* 8.1* 8.3*  HCT 26.9* 26.3* 26.7*  PLT  304 299 302  MCV 87.3 86.5 85.6  MCH 27.6 26.6 26.6  MCHC 31.6 30.8 31.1  RDW 18.3* 17.9* 17.8*  LYMPHSABS 1.2 1.9  --   MONOABS 0.4 0.3  --   EOSABS 0.4 0.4  --   BASOSABS 0.0 0.1  --     Chemistries   Recent Labs Lab 10/16/15 1439 10/19/15 0439 10/19/15 0732 10/20/15 0257 10/21/15 0259  NA 139 139  --  140 140  K 3.6 4.0  --  3.5 3.9  CL 111 110  --  107 107  CO2 20* 20*  --  22 24  GLUCOSE 92 96  --  64* 90  BUN 28* 34*  --  31* 33*  CREATININE 2.88* 3.34* 3.35* 3.20* 3.62*  CALCIUM 8.5* 8.1*  --  8.5* 8.5*  AST  --   --   --   --  18  ALT  --   --   --   --  15  ALKPHOS  --   --   --   --  61  BILITOT  --   --   --   --  0.4   ------------------------------------------------------------------------------------------------------------------ estimated creatinine clearance is 20.9 mL/min (by C-G formula based on SCr of 3.62 mg/dL (H)). ------------------------------------------------------------------------------------------------------------------ No results for input(s): HGBA1C in the last 72 hours. ------------------------------------------------------------------------------------------------------------------ No results for input(s): CHOL, HDL, LDLCALC, TRIG, CHOLHDL, LDLDIRECT in the last 72 hours. ------------------------------------------------------------------------------------------------------------------ No results for input(s): TSH, T4TOTAL, T3FREE, THYROIDAB in the last 72 hours.  Invalid input(s): FREET3 ------------------------------------------------------------------------------------------------------------------  Recent Labs  10/19/15 1036  VITAMINB12 511  FOLATE 9.2  FERRITIN 41  TIBC 302  IRON 25*  RETICCTPCT 3.2*    Coagulation profile No results for input(s): INR, PROTIME in the last 168 hours.  No results for input(s): DDIMER in the last 72 hours.  Cardiac Enzymes  Recent Labs Lab 10/16/15 1907 10/19/15 1036  TROPONINI  0.14* 0.09*   ------------------------------------------------------------------------------------------------------------------ Invalid input(s): POCBNP   CBG: No results for input(s): GLUCAP in the last 168 hours.     Studies: Dg Chest 2 View  Result Date: 10/20/2015 CLINICAL DATA:  Cough for 4 days.  No chest pain. EXAM: CHEST  2 VIEW COMPARISON:  October 19, 2015 FINDINGS: Stable cardiomegaly. No other interval changes or acute abnormalities. IMPRESSION: Stable cardiomegaly.  No acute abnormality. Electronically Signed   By: April Samavid  Williams [name] M.D   On: 10/20/2015 12:40   Nm Pulmonary Perf And Vent  Result Date: 10/20/2015 CLINICAL DATA:  Evaluate for PE.  Shortness of breath. EXAM: NUCLEAR MEDICINE VENTILATION - PERFUSION LUNG SCAN TECHNIQUE: Ventilation images were obtained in multiple projections using inhaled aerosol Tc-6126m DTPA. Perfusion images were obtained in multiple projections after intravenous injection of Tc-8026m MAA. RADIOPHARMACEUTICALS:  31.0 mCi Technetium-2726m DTPA aerosol inhalation and 3.1 mCi Technetium-7826m MAA IV COMPARISON:  Chest x-ray from earlier today FINDINGS: Ventilation: No focal ventilation defect. Perfusion: No wedge shaped peripheral perfusion defects to suggest acute  pulmonary embolism. IMPRESSION: No evidence of pulmonary embolus.  Normal V/Q scan. Electronically Signed   By: April [name] M.D   On: 10/20/2015 13:06      No results found for: HGBA1C Lab Results  Component Value Date   CREATININE 3.62 (H) 10/21/2015       Scheduled Meds: . carvedilol  50 mg Oral BID WC  . ferrous sulfate  325 mg Oral Daily  . furosemide  40 mg Oral BID  . heparin  5,000 Units Subcutaneous Q8H  . hydrALAZINE  100 mg Oral Q8H  .  HYDROmorphone (DILAUDID) injection  2 mg Intravenous Once  . isosorbide mononitrate  30 mg Oral Daily  . nicotine  21 mg Transdermal Daily  . sodium chloride flush  3 mL Intravenous Q12H  . topiramate  50 mg Oral BID    Continuous Infusions:    LOS: 2 days    Time spent: >30 MINS    [facility]  [facility] Pager [phone]. If 7PM-7AM, please contact night-coverage at [email or web address], password [facility] 10/21/2015, 9:10 AM  LOS: 2 days

## 2015-10-21 NOTE — Progress Notes (Signed)
[name] Date of Encounter: 10/21/2015  Primary Cardiologist: Dr. [place]  Hospital Problem List     Principal Problem:   Acute on chronic diastolic HF (heart failure) (HCC) Active Problems:   Essential hypertension   Chronic kidney disease   Iron deficiency anemia   COPD (chronic obstructive pulmonary disease) (HCC)   CHF (congestive heart failure) (HCC)   Hypertensive heart disease     Subjective   Breathing better today.  Inpatient Medications    Scheduled Meds: . amLODipine  5 mg Oral Daily  . carvedilol  50 mg Oral BID WC  . ferrous sulfate  325 mg Oral Daily  . furosemide  40 mg Oral BID  . heparin  5,000 Units Subcutaneous Q8H  . hydrALAZINE  100 mg Oral Q8H  . isosorbide mononitrate  30 mg Oral Daily  . nicotine  21 mg Transdermal Daily  . sodium chloride flush  3 mL Intravenous Q12H  . topiramate  50 mg Oral BID   Continuous Infusions:   PRN Meds: sodium chloride, acetaminophen **AND** diphenhydrAMINE, acetaminophen, ALPRAZolam, butalbital-acetaminophen-caffeine, hydrALAZINE, ipratropium-albuterol, ondansetron (ZOFRAN) IV, oxymetazoline, sodium chloride flush, technetium TC 36M diethylenetriame-pentaacetic acid, traMADol, zolpidem   Vital Signs    Vitals:   10/20/15 1744 10/20/15 2118 10/21/15 0615 10/21/15 0932  BP: (!) 146/91 (!) 141/81 (!) 167/91 (!) 184/77  Pulse: 73 79 78 88  Resp:  18 18   Temp:  97.7 F (36.5 C) 97.5 F (36.4 C)   TempSrc:  Oral Oral   SpO2:  100% 95%   Weight:   172 lb 1.6 oz (78.1 kg)   Height:        Intake/Output Summary (Last 24 hours) at 10/21/15 1037 Last data filed at 10/20/15 1812  Gross per 24 hour  Intake              240 ml  Output                0 ml  Net              240 ml   Filed Weights   10/19/15 0341 10/20/15 0539 10/21/15 0615  Weight: 180 lb (81.6 kg) 174 lb (78.9 kg) 172 lb 1.6 oz (78.1 kg)    Physical Exam   GEN: Well nourished, well developed, in no acute  distress.  HEENT: Grossly normal.  Neck: Supple, no JVD, carotid bruits, or masses. Cardiac: RRR, no murmurs, rubs, or gallops. No clubbing, cyanosis, edema.  Radials/DP/PT 2+ and equal bilaterally.  Respiratory:  Respirations regular and unlabored, clear to auscultation bilaterally. GI: Soft, nontender, nondistended, BS + x 4. MS: no deformity or atrophy. Skin: warm and dry, no rash. Neuro:  Strength and sensation are intact. Psych: AAOx3.  Normal affect.  Labs    CBC  Recent Labs  10/19/15 0439 10/20/15 0257  WBC 6.9 5.4  NEUTROABS 4.2  --   HGB 8.1* 8.3*  HCT 26.3* 26.7*  MCV 86.5 85.6  PLT 299 302   Basic Metabolic Panel  Recent Labs  10/20/15 0257 10/21/15 0259  NA 140 140  K 3.5 3.9  CL 107 107  CO2 22 24  GLUCOSE 64* 90  BUN 31* 33*  CREATININE 3.20* 3.62*  CALCIUM 8.5* 8.5*   Liver Function Tests  Recent Labs  10/21/15 0259  AST 18  ALT 15  ALKPHOS 61  BILITOT 0.4  PROT 6.2*  ALBUMIN 3.3*   No results for input(s): LIPASE, AMYLASE in  the last 72 hours. Cardiac Enzymes  Recent Labs  10/19/15 1036  TROPONINI 0.09*   BNP Invalid input(s): POCBNP D-Dimer No results for input(s): DDIMER in the last 72 hours. Hemoglobin A1C No results for input(s): HGBA1C in the last 72 hours. Fasting Lipid Panel No results for input(s): CHOL, HDL, LDLCALC, TRIG, CHOLHDL, LDLDIRECT in the last 72 hours. Thyroid Function Tests No results for input(s): TSH, T4TOTAL, T3FREE, THYROIDAB in the last 72 hours.  Invalid input(s): FREET3  Telemetry    NSR - Personally Reviewed  ECG    No ECG today  Radiology    Dg Chest 2 View  Result Date: 10/20/2015 CLINICAL DATA:  Cough for 4 days.  No chest pain. EXAM: CHEST  2 VIEW COMPARISON:  October 19, 2015 FINDINGS: Stable cardiomegaly. No other interval changes or acute abnormalities. IMPRESSION: Stable cardiomegaly.  No acute abnormality. Electronically Signed   By: Gerome Samavid  Williams III M.D   On: 10/20/2015  12:40   Nm Pulmonary Perf And Vent  Result Date: 10/20/2015 CLINICAL DATA:  Evaluate for PE.  Shortness of breath. EXAM: NUCLEAR MEDICINE VENTILATION - PERFUSION LUNG SCAN TECHNIQUE: Ventilation images were obtained in multiple projections using inhaled aerosol Tc-6819m DTPA. Perfusion images were obtained in multiple projections after intravenous injection of Tc-7619m MAA. RADIOPHARMACEUTICALS:  31.0 mCi Technetium-6919m DTPA aerosol inhalation and 3.1 mCi Technetium-619m MAA IV COMPARISON:  Chest x-ray from earlier today FINDINGS: Ventilation: No focal ventilation defect. Perfusion: No wedge shaped peripheral perfusion defects to suggest acute pulmonary embolism. IMPRESSION: No evidence of pulmonary embolus.  Normal V/Q scan. Electronically Signed   By: Gerome Samavid  Williams III M.D   On: 10/20/2015 13:06    Cardiac Studies   EF 45% in 10/17  Patient Profile       Assessment & Plan    HTN with kidney disease:  Agree with changing diuretic to oral.  Follow Cr.  Blood pressure difficult to control. If Cr increases further, may need nephrology input.  Add amlodipine for BP control.  Already on max Coreg and hydralazine.  No ACE-I due to kidney disease.  Signed, Lance MussJayadeep Myasia Sinatra, MD  10/21/2015, 10:37 AM
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
## 2015-10-22 DIAGNOSIS — I5021 Acute systolic (congestive) heart failure: Secondary | ICD-10-CM

## 2015-10-22 DIAGNOSIS — J42 Unspecified chronic bronchitis: Secondary | ICD-10-CM

## 2015-10-22 LAB — COMPREHENSIVE METABOLIC PANEL
ALT: 13 U/L — ABNORMAL LOW (ref 14–54)
AST: 16 U/L (ref 15–41)
Albumin: 3.4 g/dL — ABNORMAL LOW (ref 3.5–5.0)
Alkaline Phosphatase: 68 U/L (ref 38–126)
Anion gap: 11 (ref 5–15)
BUN: 36 mg/dL — ABNORMAL HIGH (ref 6–20)
CO2: 22 mmol/L (ref 22–32)
Calcium: 8.6 mg/dL — ABNORMAL LOW (ref 8.9–10.3)
Chloride: 105 mmol/L (ref 101–111)
Creatinine, Ser: 3.69 mg/dL — ABNORMAL HIGH (ref 0.44–1.00)
GFR calc Af Amer: 17 mL/min — ABNORMAL LOW (ref >60)
GFR calc non Af Amer: 14 mL/min — ABNORMAL LOW (ref >60)
Glucose, Bld: 118 mg/dL — ABNORMAL HIGH (ref 65–99)
Potassium: 4 mmol/L (ref 3.5–5.1)
Sodium: 138 mmol/L (ref 135–145)
Total Bilirubin: 0.2 mg/dL — ABNORMAL LOW (ref 0.3–1.2)
Total Protein: 6.1 g/dL — ABNORMAL LOW (ref 6.5–8.1)

## 2015-10-22 MED ORDER — BUTALBITAL-APAP-CAFFEINE 50-325-40 MG PO TABS: 1.0000 | ORAL_TABLET | ORAL | 0 refills | Status: AC | PRN

## 2015-10-22 MED ORDER — FUROSEMIDE 40 MG PO TABS: 40.0000 mg | ORAL_TABLET | Freq: Two times a day (BID) | ORAL | 2 refills | Status: DC

## 2015-10-22 MED ORDER — HYDRALAZINE HCL 100 MG PO TABS: 100.0000 mg | ORAL_TABLET | Freq: Three times a day (TID) | ORAL | 1 refills | Status: AC

## 2015-10-22 MED ORDER — CARVEDILOL 25 MG PO TABS: 50.0000 mg | ORAL_TABLET | Freq: Two times a day (BID) | ORAL | 1 refills | Status: DC

## 2015-10-22 MED ORDER — ISOSORBIDE MONONITRATE ER 30 MG PO TB24: 30.0000 mg | ORAL_TABLET | Freq: Every day | ORAL | 1 refills | Status: DC

## 2015-10-22 MED ORDER — NICOTINE 21 MG/24HR TD PT24: 21.0000 mg | MEDICATED_PATCH | Freq: Every day | TRANSDERMAL | 0 refills | Status: DC

## 2015-10-22 MED ORDER — AMLODIPINE BESYLATE 10 MG PO TABS: 10.0000 mg | ORAL_TABLET | Freq: Every day | ORAL | 1 refills | Status: DC

## 2015-10-22 NOTE — Discharge Summary (Signed)
Physician Discharge Summary  April [name] MRN: [id number] DOB/AGE: Oct 18, 1975 40 y.o.  PCP: Arnoldo Morale, MD   Admit date: 10/19/2015 Discharge date: 10/22/2015  Discharge Diagnoses:    Principal Problem:   Acute on chronic diastolic HF (heart failure) (HCC) Active Problems:   Essential hypertension   Chronic kidney disease   Iron deficiency anemia   COPD (chronic obstructive pulmonary disease) (HCC)   CHF (congestive heart failure) (HCC)   Hypertensive heart disease    Follow-up recommendations Follow-up with PCP in 3-5 days , including all  additional recommended appointments as below Follow-up CBC, CMP in 3-5 days Patient advised to follow-up with cardiology and nephrology in the outpatient setting      Current Discharge Medication List    START taking these medications   Details  amLODipine (NORVASC) 10 MG tablet Take 1 tablet (10 mg total) by mouth daily. Qty: 30 tablet, Refills: 1    butalbital-acetaminophen-caffeine (FIORICET, ESGIC) 50-325-40 MG tablet Take 1 tablet by mouth every 4 (four) hours as needed for headache. Qty: 14 tablet, Refills: 0    isosorbide mononitrate (IMDUR) 30 MG 24 hr tablet Take 1 tablet (30 mg total) by mouth daily. Qty: 30 tablet, Refills: 1    nicotine (NICODERM CQ - DOSED IN MG/24 HOURS) 21 mg/24hr patch Place 1 patch (21 mg total) onto the skin daily. Qty: 28 patch, Refills: 0      CONTINUE these medications which have CHANGED   Details  carvedilol (COREG) 25 MG tablet Take 2 tablets (50 mg total) by mouth 2 (two) times daily with a meal. Qty: 60 tablet, Refills: 1    furosemide (LASIX) 40 MG tablet Take 1 tablet (40 mg total) by mouth 2 (two) times daily. Qty: 60 tablet, Refills: 2    hydrALAZINE (APRESOLINE) 100 MG tablet Take 1 tablet (100 mg total) by mouth every 8 (eight) hours. Qty: 90 tablet, Refills: 1      CONTINUE these medications which have NOT CHANGED   Details  albuterol (PROVENTIL  HFA;VENTOLIN HFA) 108 (90 BASE) MCG/ACT inhaler Inhale 2 puffs into the lungs every 4 (four) hours as needed for wheezing or shortness of breath. Qty: 1 Inhaler, Refills: 0    albuterol (PROVENTIL) (2.5 MG/3ML) 0.083% nebulizer solution Take 2.5 mg by nebulization every 6 (six) hours as needed for wheezing or shortness of breath.    beclomethasone (QVAR) 80 MCG/ACT inhaler Inhale 1 puff into the lungs 2 (two) times daily. Qty: 1 Inhaler, Refills: 3    diphenhydramine-acetaminophen (TYLENOL PM) 25-500 MG TABS tablet Take 1-2 tablets by mouth at bedtime as needed (for sleep).    ferrous sulfate 325 (65 FE) MG tablet Take 1 tablet (325 mg total) by mouth daily. Qty: 30 tablet, Refills: 0    oxymetazoline (AFRIN NASAL SPRAY) 0.05 % nasal spray Place 1 spray into right nostril 2 (two) times daily as needed for congestion. Qty: 30 mL, Refills: 0    topiramate (TOPAMAX) 50 MG tablet Take 1 tablet (50 mg total) by mouth 2 (two) times daily. Qty: 60 tablet, Refills: 3      STOP taking these medications     lisinopril (PRINIVIL,ZESTRIL) 20 MG tablet          Discharge Condition:  Stable   Discharge Instructions Get Medicines reviewed and adjusted: Please take all your medications with you for your next visit with your Primary MD  Please request your Primary MD to go over all hospital tests and procedure/radiological results at the follow up,  please ask your Primary MD to get all Hospital records sent to his/her office.  If you experience worsening of your admission symptoms, develop shortness of breath, life threatening emergency, suicidal or homicidal thoughts you must seek medical attention immediately by calling 911 or calling your MD immediately if symptoms less severe.  You must read complete instructions/literature along with all the possible adverse reactions/side effects for all the Medicines you take and that have been prescribed to you. Take any new Medicines after you have  completely understood and accpet all the possible adverse reactions/side effects.   Do not drive when taking Pain medications.   Do not take more than prescribed Pain, Sleep and Anxiety Medications  Special Instructions: If you have smoked or chewed Tobacco in the last 2 yrs please stop smoking, stop any regular Alcohol and or any Recreational drug use.  Wear Seat belts while driving.  Please note  You were cared for by a hospitalist during your hospital stay. Once you are discharged, your primary care physician will handle any further medical issues. Please note that NO REFILLS for any discharge medications will be authorized once you are discharged, as it is imperative that you return to your primary care physician (or establish a relationship with a primary care physician if you do not have one) for your aftercare needs so that they can reassess your need for medications and monitor your lab values.  Discharge Instructions    Diet - low sodium heart healthy    Complete by:  As directed    Increase activity slowly    Complete by:  As directed        No Known Allergies    Disposition: home   Consults: Cardiology     Significant Diagnostic Studies:  Dg Chest 2 View  Result Date: 10/20/2015 CLINICAL DATA:  Cough for 4 days.  No chest pain. EXAM: CHEST  2 VIEW COMPARISON:  October 19, 2015 FINDINGS: Stable cardiomegaly. No other interval changes or acute abnormalities. IMPRESSION: Stable cardiomegaly.  No acute abnormality. Electronically Signed   By: Dorise Bullion III M.D   On: 10/20/2015 12:40   Dg Chest 2 View  Result Date: 10/19/2015 CLINICAL DATA:  40 y/o F; complaint of asthma flare up over the last 4-5 days. History of COPD, congestive heart failure, and asthma. EXAM: CHEST  2 VIEW COMPARISON:  10/16/2015 chest radiograph FINDINGS: Moderate cardiomegaly stable given projection and technique. Peripheral septal lines consistent interest edema. Linear bibasilar  opacities probably represent atelectasis. No acute osseous abnormality. IMPRESSION: Cardiomegaly. Interstitial edema and bibasilar atelectasis increased from prior radiographs. Electronically Signed   By: Kristine Garbe M.D.   On: 10/19/2015 05:23   Dg Chest 2 View  Result Date: 10/16/2015 CLINICAL DATA:  Cough and shortness of breath for 2 days. History of asthma and CHF EXAM: CHEST  2 VIEW COMPARISON:  10/16/2015 FINDINGS: The heart is enlarged and stable since earlier chest film but definitely enlarged since the prior chest x-ray from 02/14/2015. There is interstitial pulmonary edema, peribronchial thickening and streaky areas of atelectasis. No large pleural effusion. Small amount of fluid is noted in the right major fissure. The bony thorax is intact. IMPRESSION: Cardiac enlargement, central vascular congestion and interstitial pulmonary edema. No all pleural effusion or focal infiltrate. Electronically Signed   By: Marijo Sanes M.D.   On: 10/16/2015 16:04   Ct Head Wo Contrast  Result Date: 10/21/2015 CLINICAL DATA:  40 year old hypertensive female with headache without injury. Initial encounter. EXAM:  CT HEAD WITHOUT CONTRAST TECHNIQUE: Contiguous axial images were obtained from the base of the skull through the vertex without intravenous contrast. COMPARISON:  10/16/2014 head CT. FINDINGS: Brain: No intracranial hemorrhage or CT evidence of large acute infarct. Expanded partially empty sella once again noted without secondary findings of pseudotumor. No intracranial mass lesion noted on this unenhanced exam. No age advanced atrophy or hydrocephalus. Vascular: No hyperdense vessel. Skull: No worrisome abnormality. Sinuses/Orbits: No acute orbital abnormality. Visualized paranasal sinuses are clear. 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 IMPRESSION: No intracranial hemorrhage or CT evidence of large acute infarct. Expanded partially empty sella  once again noted without secondary findings of pseudotumor. Electronically Signed   By: Genia Del M.D.   On: 10/21/2015 11:17   Nm Pulmonary Perf And Vent  Result Date: 10/20/2015 CLINICAL DATA:  Evaluate for PE.  Shortness of breath. EXAM: NUCLEAR MEDICINE VENTILATION - PERFUSION LUNG SCAN TECHNIQUE: Ventilation images were obtained in multiple projections using inhaled aerosol Tc-23mDTPA. Perfusion images were obtained in multiple projections after intravenous injection of Tc-978mAA. RADIOPHARMACEUTICALS:  31.0 mCi Technetium-9927mPA aerosol inhalation and 3.1 mCi Technetium-49m78m IV COMPARISON:  Chest x-ray from earlier today FINDINGS: Ventilation: No focal ventilation defect. Perfusion: No wedge shaped peripheral perfusion defects to suggest acute pulmonary embolism. IMPRESSION: No evidence of pulmonary embolus.  Normal V/Q scan. Electronically Signed   By: DaviDorise Bullion M.D   On: 10/20/2015 13:06   Dg Chest Port 1 View  Result Date: 10/16/2015 CLINICAL DATA:  Asthma with shortness of breath, wheezing, and chest tightness. EXAM: PORTABLE CHEST 1 VIEW COMPARISON:  02/14/2015 FINDINGS: AP semi upright chest obtained 1407 hours. The cardio pericardial silhouette is enlarged. Pulmonary vascular congestion with bilateral hazy open opacity over the lungs. Edema is a concern. Contribution to appearance of overlying soft tissue is suspected. The visualized bony structures of the thorax are intact. Telemetry leads overlie the chest. IMPRESSION: Cardiopericardial silhouette is enlarged. Pericardial effusion not excluded. Vascular congestion with probable interstitial pulmonary edema. Dedicated upright PA and lateral chest x-ray would probably prove helpful to better characterize, when the patient is able. Electronically Signed   By: EricMisty Stanley.   On: 10/16/2015 14:27    2-D echo LV EF: 45%  ------------------------------------------------------------------- Indications:      CHF -  428.0.  ------------------------------------------------------------------- History:   PMH:   Chronic obstructive pulmonary disease.  Risk factors:  Current tobacco use. Hypertension.  ------------------------------------------------------------------- Study Conclusions  - Left ventricle: The cavity size was normal. Wall thickness was   increased in a pattern of severe LVH. Systolic function was   mildly reduced. The estimated ejection fraction was 45%. Diffuse   hypokinesis. Diastolic dysfunction, grade indeterminate. - Mitral valve: There was mild regurgitation. - Left atrium: The atrium was severely dilated. - Atrial septum: No defect or patent foramen ovale was identified. - Tricuspid valve: There was mild regurgitation.   Filed Weights   10/20/15 0539 10/21/15 0615 10/22/15 0500  Weight: 78.9 kg (174 lb) 78.1 kg (172 lb 1.6 oz) 78.6 kg (173 lb 4.8 oz)     Microbiology: No results found for this or any previous visit (from the past 240 hour(s)).     Blood Culture    Component Value Date/Time   SDES ABSCESS TISSUE RIGHT LEG 09/03/2015 1310   SPECREQUEST PATIENT ON FOLLOWING  VANCOMYCIN AND ANCEF 09/03/2015 1310   CULT FEW STAPHYLOCOCCUS AUREUS NO ANAEROBES ISOLATED  09/03/2015 1310  REPTSTATUS 09/08/2015 FINAL 09/03/2015 1310      Labs: Results for orders placed or performed during the hospital encounter of 10/19/15 (from the past 48 hour(s))  Comprehensive metabolic panel     Status: Abnormal   Collection Time: 10/21/15  2:59 AM  Result Value Ref Range   Sodium 140 135 - 145 mmol/L   Potassium 3.9 3.5 - 5.1 mmol/L   Chloride 107 101 - 111 mmol/L   CO2 24 22 - 32 mmol/L   Glucose, Bld 90 65 - 99 mg/dL   BUN 33 (H) 6 - 20 mg/dL   Creatinine, Ser 3.62 (H) 0.44 - 1.00 mg/dL   Calcium 8.5 (L) 8.9 - 10.3 mg/dL   Total Protein 6.2 (L) 6.5 - 8.1 g/dL   Albumin 3.3 (L) 3.5 - 5.0 g/dL   AST 18 15 - 41 U/L   ALT 15 14 - 54 U/L   Alkaline Phosphatase 61 38 -  126 U/L   Total Bilirubin 0.4 0.3 - 1.2 mg/dL   GFR calc non Af Amer 15 (L) >60 mL/min   GFR calc Af Amer 17 (L) >60 mL/min    Comment: (NOTE) The eGFR has been calculated using the CKD EPI equation. This calculation has not been validated in all clinical situations. eGFR's persistently <60 mL/min signify possible Chronic Kidney Disease.    Anion gap 9 5 - 15  Comprehensive metabolic panel     Status: Abnormal   Collection Time: 10/22/15  2:05 AM  Result Value Ref Range   Sodium 138 135 - 145 mmol/L   Potassium 4.0 3.5 - 5.1 mmol/L   Chloride 105 101 - 111 mmol/L   CO2 22 22 - 32 mmol/L   Glucose, Bld 118 (H) 65 - 99 mg/dL   BUN 36 (H) 6 - 20 mg/dL   Creatinine, Ser 3.69 (H) 0.44 - 1.00 mg/dL   Calcium 8.6 (L) 8.9 - 10.3 mg/dL   Total Protein 6.1 (L) 6.5 - 8.1 g/dL   Albumin 3.4 (L) 3.5 - 5.0 g/dL   AST 16 15 - 41 U/L   ALT 13 (L) 14 - 54 U/L   Alkaline Phosphatase 68 38 - 126 U/L   Total Bilirubin 0.2 (L) 0.3 - 1.2 mg/dL   GFR calc non Af Amer 14 (L) >60 mL/min   GFR calc Af Amer 17 (L) >60 mL/min    Comment: (NOTE) The eGFR has been calculated using the CKD EPI equation. This calculation has not been validated in all clinical situations. eGFR's persistently <60 mL/min signify possible Chronic Kidney Disease.    Anion gap 11 5 - 15     Lipid Panel  No results found for: CHOL, TRIG, HDL, CHOLHDL, VLDL, LDLCALC, LDLDIRECT   No results found for: HGBA1C   Lab Results  Component Value Date   CREATININE 3.69 (H) 10/22/2015     HPI :  April [name] is a 66 female with PMH combined CHF, CKD IV, HTN, Migraines, tobacco abuse, and OSA. Reports she was followed by a cardiologist in [place] when she lived there, but has not seen anyone since moving here. She is currently seen by her PCP who Rx her lasix.   She currently lives in an appt and has to walk up 3 flights of stairs multiple times a day. Has noticed increased dyspnea over the past couple of months, but  worsened in the past 4 days. Also reports PND and orthopnea. She presented to the [facility] ED on 10/11 with  the same symptoms and was admitted at that time for acute on chronic CHF. Left AMA that admission as she was not allowed to smoke.   Presented back to the [facility] ED today with ongoing c/o of dyspnea. Denies any lower extremity edema or abd fullness. Reports she has been out of her lasix " a long time", and just has not had them refilled. Also reports taking her home blood pressure medications, but states that her "blood pressure is always high, no matter what she takes". She continues to smoke. In the ED her blood pressure was noted 834H systolic. Also reported a headache and minimally productive cough. Labs in the ED were significant for BNP 1892, Cr 3.34, Hgb 8.1 and CXR with interstitial edema with bibasilar atelectasis. She was given a dose of 76m IV lasix while in the ED with UOP of 500, along with her home blood pressure medications. Also given a dose of IV hydralazine with improvement in blood pressure.   HOSPITAL COURSE:  #1. Acute on chronic systolic/diastolic heart failure. Reports running out of Lasix and suspect antihypertensives as well. Echo done last year- Left ventricle:45% to 50%  . Chest x-ray with cardiomegaly interstitial edema. BNP 1892. EKG as noted above on admission Continue telemetry-normal sinus rhythm during this hospitalization -treated withLasix 40 mg IV twice a day2 days, subsequently switched over to Lasix by mouth- weight decreased 180>174>172 lbs  -No ACE inhibitor due to chronic kidney disease -Continue home meds including beta blocker -Repeat Echocardiogram shows EF of 45%, diffuse hypokinesis, VQ scan negative Recommended to continue diuresis with PO lasix and closely follow up with Dr WJustin Mendand Dr JMartiniqueto continue titration of cardiac meds based on renal fn    #2. Hypertension. Uncontrolled.  Home medications include Coreg, Lasix, hydralazine.  Reports recently taken off of lisinopril. Now on Coreg Lasix and hydralazine, cards added norvasc  -Lasix as noted above,No ACEi or ARB given renal function -Add when necessary IV hydralazine  3. Acute on chronic kidney disease stage IV. Creatinine increasing, now 3.68 . Baseline around 2.3-2.5 Chart review indicates baseline closer to 2.4.  -Hold lisinopril -lasix as above  Patient advised to follow-up with Dr. BRuby Colaclosely in the outpatient setting   #4. Iron deficiency anemia. Baseline hemoglobin around 11. Setting of chronic disease.Hemoglobin 8.1. Chart review indicates a downward over the last several months. Review indicates ferritin and iron levels low in the past. -Anemia panel-consistent with iron deficiency -Continue supplement Received IV iron this admission  #5. History of asthma. /COPD O2 dependent. Chest x-ray as noted above -Scheduled nebs -continue home inhaler -monitor oxygen saturation Nicotine patch due to history of nicotine dependence  #6-headache Has a history of migraines, on Topamax Started on Fioricet, no relief therefore ordered 1 dose of Dilaudid and ordered a CT of the head without contrast which was negative    Discharge Exam: *  Blood pressure (!) 169/80, pulse 91, temperature 98.3 F (36.8 C), temperature source Oral, resp. rate 18, height _0  (1.626 m), weight 78.6 kg (173 lb 4.8 oz), last menstrual period 10/13/2015, SpO2 100 %.  General: Pleasant AA female, NAD Psych: Normal affect. Neuro: Alert and oriented X 3. Moves all extremities spontaneously. HEENT: Normal           Neck: Supple without bruits or JVD. Lungs:  Resp regular and unlabored, Slightly diminished with mild crackles. Heart: RRR no s3, s4, or murmurs. Abdomen: Soft, non-tender, non-distended, BS + x 4.  Extremities: No clubbing,  cyanosis or edema. DP/PT/Radials 2+ and equal bilaterally.     Follow-up [place].  Go on 10/29/2015.   Specialty:  Internal Medicine Why:  at 10:00am for an appointment in the [facility] with Dr Lisette Grinder information: [place]. [address]leeper [id number] [place] [phone]       Peter Martinique, MD. Schedule an appointment as soon as possible for a visit in 1 week(s).   Specialty:  Cardiology Why:  Call cardiology to set up outpatient follow-up Contact information: [address] [place]  [place] [address] [phone]        Sherril Croon, MD. Schedule an appointment as soon as possible for a visit in 1 week(s).   Specialty:  Nephrology Why:  Call nephrology as soon as possible to follow-up on your kidney function Contact information: [place] [address] [phone]           Signed: Reyne Dumas 10/22/2015, 10:46 AM        Time spent >45 mins

## 2015-10-22 NOTE — Care Management Note (Signed)
Case Management Note Donn PieriniKristi Egan Berkheimer RN, BSN Unit 2W-Case Manager [phone]  Patient Details  Name: April CarrowLakricia [name] MRN: [id number] Date of Birth: Sep 07, 1975  Subjective/Objective:   Pt admitted with HF and HTN                 Action/Plan: PTA pt lived at home with husband- [facility] - pt will f/u with [facility] for close f/u- appointment has been made for 10/24  Expected Discharge Date:   10/22/15               Expected Discharge Plan:  Home/Self Care  In-House Referral:     Discharge planning Services  CM Consult, [facility], Follow-up appt scheduled  Post Acute Care Choice:    Choice offered to:     DME Arranged:    DME Agency:     HH Arranged:    HH Agency:     Status of Service:  Completed, signed off  If discussed at [employer] Length of Stay Meetings, dates discussed:    Additional Comments:  Darrold SpanWebster, Keelin Neville Hall, RN 10/22/2015, 10:47 AM

## 2015-10-22 NOTE — Progress Notes (Signed)
Patient's oxygen level remained at 100% on room air while ambulating.

## 2015-10-22 NOTE — Progress Notes (Signed)
[name] Date of Encounter: 10/22/2015  Primary Cardiologist: Dr. SwazilandJordan  Hospital Problem List     Principal Problem:   Acute on chronic diastolic HF (heart failure) (HCC) Active Problems:   Essential hypertension   Chronic kidney disease   Iron deficiency anemia   COPD (chronic obstructive pulmonary disease) (HCC)   CHF (congestive heart failure) (HCC)   Hypertensive heart disease     Subjective   Breathing better today.  Wants to go home.  Inpatient Medications    Scheduled Meds: . amLODipine  5 mg Oral Daily  . carvedilol  50 mg Oral BID WC  . ferrous sulfate  325 mg Oral Daily  . furosemide  40 mg Oral BID  . heparin  5,000 Units Subcutaneous Q8H  . hydrALAZINE  100 mg Oral Q8H  . isosorbide mononitrate  30 mg Oral Daily  . nicotine  21 mg Transdermal Daily  . sodium chloride flush  3 mL Intravenous Q12H  . topiramate  50 mg Oral BID   Continuous Infusions:   PRN Meds: sodium chloride, acetaminophen **AND** diphenhydrAMINE, acetaminophen, ALPRAZolam, butalbital-acetaminophen-caffeine, hydrALAZINE, ipratropium-albuterol, ondansetron (ZOFRAN) IV, oxymetazoline, sodium chloride flush, technetium TC 83M diethylenetriame-pentaacetic acid, traMADol, zolpidem   Vital Signs    Vitals:   10/21/15 2228 10/22/15 0500 10/22/15 0622 10/22/15 0938  BP: (!) 155/86  (!) 168/92 (!) 169/80  Pulse:   77 91  Resp:   18   Temp:   98.3 F (36.8 C)   TempSrc:   Oral   SpO2:   100%   Weight:  173 lb 4.8 oz (78.6 kg)    Height:        Intake/Output Summary (Last 24 hours) at 10/22/15 1058 Last data filed at 10/22/15 0800  Gross per 24 hour  Intake              240 ml  Output                0 ml  Net              240 ml   Filed Weights   10/20/15 0539 10/21/15 0615 10/22/15 0500  Weight: 174 lb (78.9 kg) 172 lb 1.6 oz (78.1 kg) 173 lb 4.8 oz (78.6 kg)    Physical Exam   GEN: Well nourished, well developed, in no acute distress. Lying  flat without shortness of breath. HEENT: Grossly normal.  Neck: Supple, no JVD, carotid bruits, or masses. Cardiac: RRR, no murmurs, rubs, or gallops. No clubbing, cyanosis, edema.  Radials/DP/PT 2+ and equal bilaterally.  Respiratory:  Respirations regular and unlabored, clear to auscultation bilaterally. GI: Soft, nontender, nondistended, BS + x 4. MS: no deformity or atrophy. Skin: warm and dry, no rash. Neuro:  Strength and sensation are intact. Psych: AAOx3.  Normal affect.  Labs    CBC  Recent Labs  10/20/15 0257  WBC 5.4  HGB 8.3*  HCT 26.7*  MCV 85.6  PLT 302   Basic Metabolic Panel  Recent Labs  10/21/15 0259 10/22/15 0205  NA 140 138  K 3.9 4.0  CL 107 105  CO2 24 22  GLUCOSE 90 118*  BUN 33* 36*  CREATININE 3.62* 3.69*  CALCIUM 8.5* 8.6*   Liver Function Tests  Recent Labs  10/21/15 0259 10/22/15 0205  AST 18 16  ALT 15 13*  ALKPHOS 61 68  BILITOT 0.4 0.2*  PROT 6.2* 6.1*  ALBUMIN 3.3* 3.4*   No results for input(s):  LIPASE, AMYLASE in the last 72 hours. Cardiac Enzymes No results for input(s): CKTOTAL, CKMB, CKMBINDEX, TROPONINI in the last 72 hours. BNP Invalid input(s): POCBNP D-Dimer No results for input(s): DDIMER in the last 72 hours. Hemoglobin A1C No results for input(s): HGBA1C in the last 72 hours. Fasting Lipid Panel No results for input(s): CHOL, HDL, LDLCALC, TRIG, CHOLHDL, LDLDIRECT in the last 72 hours. Thyroid Function Tests No results for input(s): TSH, T4TOTAL, T3FREE, THYROIDAB in the last 72 hours.  Invalid input(s): FREET3  Telemetry    NSR - Personally Reviewed  ECG    No ECG today  Radiology    Dg Chest 2 View  Result Date: 10/20/2015 CLINICAL DATA:  Cough for 4 days.  No chest pain. EXAM: CHEST  2 VIEW COMPARISON:  October 19, 2015 FINDINGS: Stable cardiomegaly. No other interval changes or acute abnormalities. IMPRESSION: Stable cardiomegaly.  No acute abnormality. Electronically Signed   By: Gerome Sam III M.D   On: 10/20/2015 12:40   Ct Head Wo Contrast  Result Date: 10/21/2015 CLINICAL DATA:  40 year old hypertensive female with headache without injury. Initial encounter. EXAM: CT HEAD WITHOUT CONTRAST TECHNIQUE: Contiguous axial images were obtained from the base of the skull through the vertex without intravenous contrast. COMPARISON:  10/16/2014 head CT. FINDINGS: Brain: No intracranial hemorrhage or CT evidence of large acute infarct. Expanded partially empty sella once again noted without secondary findings of pseudotumor. No intracranial mass lesion noted on this unenhanced exam. No age advanced atrophy or hydrocephalus. Vascular: No hyperdense vessel. Skull: No worrisome abnormality. Sinuses/Orbits: No acute orbital abnormality. Visualized paranasal sinuses are clear. 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 IMPRESSION: No intracranial hemorrhage or CT evidence of large acute infarct. Expanded partially empty sella once again noted without secondary findings of pseudotumor. Electronically Signed   By: Lacy Duverney M.D.   On: 10/21/2015 11:17   Nm Pulmonary Perf And Vent  Result Date: 10/20/2015 CLINICAL DATA:  Evaluate for PE.  Shortness of breath. EXAM: NUCLEAR MEDICINE VENTILATION - PERFUSION LUNG SCAN TECHNIQUE: Ventilation images were obtained in multiple projections using inhaled aerosol Tc-1m DTPA. Perfusion images were obtained in multiple projections after intravenous injection of Tc-86m MAA. RADIOPHARMACEUTICALS:  31.0 mCi Technetium-84m DTPA aerosol inhalation and 3.1 mCi Technetium-78m MAA IV COMPARISON:  Chest x-ray from earlier today FINDINGS: Ventilation: No focal ventilation defect. Perfusion: No wedge shaped peripheral perfusion defects to suggest acute pulmonary embolism. IMPRESSION: No evidence of pulmonary embolus.  Normal V/Q scan. Electronically Signed   By: Gerome Sam III M.D   On: 10/20/2015 13:06    Cardiac  Studies   EF 45% in 10/17  Patient Profile       Assessment & Plan    HTN with kidney disease:  Doing well with oral diuretic..  Follow Cr.  Blood pressure difficult to control. Given increased Cr, needs nephrology f/u.  Increase amlodipine to 10 mg daily for BP control.  Already on max Coreg and hydralazine.  No ACE-I due to kidney disease.  OK to discharge from a cardiac standpoint.  Asked IM to check with nephrology.  Stressed compliance with the patient.  Signed, Lance Muss, MD  10/22/2015, 10:58 AM

## 2015-10-23 ENCOUNTER — Telehealth: Payer: Self-pay
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
## 2015-10-24 ENCOUNTER — Encounter: Payer: Self-pay | Admitting: Licensed Clinical Social Worker

## 2015-10-24 ENCOUNTER — Ambulatory Visit: Payer: Self-pay | Attending: Family Medicine | Admitting: Family Medicine

## 2015-10-24 ENCOUNTER — Encounter: Payer: Self-pay | Admitting: Family Medicine

## 2015-10-24 ENCOUNTER — Telehealth: Payer: Self-pay | Admitting: Cardiology

## 2015-10-24 ENCOUNTER — Other Ambulatory Visit: Payer: Self-pay | Admitting: Family Medicine

## 2015-10-24 VITALS — BP 170/109 | HR 82 | Temp 98.2°F | Ht 64.0 in | Wt 178.0 lb

## 2015-10-24 DIAGNOSIS — Z720 Tobacco use: Secondary | ICD-10-CM | POA: Insufficient documentation

## 2015-10-24 DIAGNOSIS — F329 Major depressive disorder, single episode, unspecified: Secondary | ICD-10-CM | POA: Insufficient documentation

## 2015-10-24 DIAGNOSIS — I5033 Acute on chronic diastolic (congestive) heart failure: Secondary | ICD-10-CM

## 2015-10-24 DIAGNOSIS — I10 Essential (primary) hypertension: Secondary | ICD-10-CM

## 2015-10-24 DIAGNOSIS — J449 Chronic obstructive pulmonary disease, unspecified: Secondary | ICD-10-CM | POA: Insufficient documentation

## 2015-10-24 DIAGNOSIS — I130 Hypertensive heart and chronic kidney disease with heart failure and stage 1 through stage 4 chronic kidney disease, or unspecified chronic kidney disease: Secondary | ICD-10-CM | POA: Insufficient documentation

## 2015-10-24 DIAGNOSIS — F418 Other specified anxiety disorders: Secondary | ICD-10-CM

## 2015-10-24 DIAGNOSIS — I5043 Acute on chronic combined systolic (congestive) and diastolic (congestive) heart failure: Secondary | ICD-10-CM | POA: Insufficient documentation

## 2015-10-24 DIAGNOSIS — F419 Anxiety disorder, unspecified: Secondary | ICD-10-CM | POA: Insufficient documentation

## 2015-10-24 DIAGNOSIS — F32A Depression, unspecified: Secondary | ICD-10-CM

## 2015-10-24 DIAGNOSIS — L03115 Cellulitis of right lower limb: Secondary | ICD-10-CM | POA: Insufficient documentation

## 2015-10-24 DIAGNOSIS — I5084 End stage heart failure: Secondary | ICD-10-CM | POA: Insufficient documentation

## 2015-10-24 DIAGNOSIS — J4540 Moderate persistent asthma, uncomplicated: Secondary | ICD-10-CM | POA: Insufficient documentation

## 2015-10-24 DIAGNOSIS — J42 Unspecified chronic bronchitis: Secondary | ICD-10-CM

## 2015-10-24 DIAGNOSIS — N184 Chronic kidney disease, stage 4 (severe): Secondary | ICD-10-CM | POA: Insufficient documentation

## 2015-10-24 MED ORDER — TOPIRAMATE 50 MG PO TABS: 50.0000 mg | ORAL_TABLET | Freq: Two times a day (BID) | ORAL | 3 refills | Status: AC

## 2015-10-24 MED ORDER — HYDROXYZINE HCL 25 MG PO TABS: 25.0000 mg | ORAL_TABLET | Freq: Three times a day (TID) | ORAL | 1 refills | Status: AC | PRN

## 2015-10-24 MED ORDER — FLUOXETINE HCL 20 MG PO TABS: 20.0000 mg | ORAL_TABLET | Freq: Every day | ORAL | 3 refills | Status: AC

## 2015-10-24 MED ORDER — BECLOMETHASONE DIPROPIONATE 80 MCG/ACT IN AERS: 2.0000 | INHALATION_SPRAY | Freq: Two times a day (BID) | RESPIRATORY_TRACT | 3 refills | Status: AC

## 2015-10-24 MED ORDER — NIFEDIPINE ER OSMOTIC RELEASE 60 MG PO TB24: 60.0000 mg | ORAL_TABLET | Freq: Two times a day (BID) | ORAL | 3 refills | Status: AC

## 2015-10-24 NOTE — Telephone Encounter (Signed)
Mrs. April [name]- April [name] is calling in reference to her disability papers , she saw Dr. SwazilandJordan in the hospital and was discharge on Tuesday

## 2015-10-24 NOTE — Patient Instructions (Signed)

## 2015-10-24 NOTE — Telephone Encounter (Signed)
Returned call to patient.She stated she needs disability forms completed.Advised to ask for Larita FifeLynn in our medical records when she comes to office.Advised she will need to pay $ 25.00.[place]oke to [place] she is aware pt will bring forms.

## 2015-10-24 NOTE — Progress Notes (Signed)
Med refill
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
## 2015-10-25 NOTE — Progress Notes (Signed)
Transitional care clinic  Date of Telephone Encounter: 10/23/15  Hospitalization dates: 10/19/15 through 10/22/15  Subjective:  Patient ID: April [name], female    DOB: 12/24/1975  Age: 40 y.o. MRN: [id number]  CC: Hypertension; Congestive Heart Failure; Chronic Kidney Disease; and Anemia   HPI April [name] is a 40 year old female with a history of hypertension, asthma, migraine, Stage IV CK D, noncompliance, CHF (2D echo from 10/2015 revealed EF of 45%, diffuse hypokinesis)who comes in to the transitional care  Clinic for follow-up after hospitalization.  She was admitted for acute on chronic systolic and diastolic heart failure with an elevated BNP and blood pressure in the accelerated range for which she was treated with IV Lasix and antihypertensive; VQ scan was negative. She was seen by cardiology with recommendations to titrate her hydralazine and isosorbide in the event of uncontrolled blood pressure. She did have an increasing creatinine from a baseline 2.5 up to 3.6. Subsequently discharged with recommendations to follow-up with cardiology and nephrology.  Today she breaks down crying when I walk into the room tells me "you didn't want to help me, you gave me an appointment that was too far away".She is also not happy that I completed her disability paper work previously but was told "your doctor didn't help you because she said you were non compliant". I pointed out to her that she was scheduled to see me one month from her last visit in 01/2015 but she never kept appointment up until now; she had also been referred to cardiology and never showed up. She endorses being depressed and did not care about her medical conditions. Denies suicidal ideations.  Past Medical History:  Diagnosis Date  . Anemia   . Asthma   . Cellulitis of right lower extremity 08/29/2015  . CHF (congestive heart failure) (HCC)   . Chronic kidney disease (CKD)   . COPD (chronic  obstructive pulmonary disease) (HCC)   . Depression   . Hypertension   . Migraine    "a few times/month" (08/29/2015)  . On home oxygen therapy    "when needed" (08/29/2015)  . OSA (obstructive sleep apnea)    "don't wear mask" (08/29/2015)    Past Surgical History:  Procedure Laterality Date  . I&D EXTREMITY Right 09/03/2015   Procedure: IRRIGATION AND DEBRIDEMENT abcess LOWER LEG with complex closure;  Surgeon: Sheral Apley, MD;  Location: [facility] OR;  Service: Orthopedics;  Laterality: Right;  . NO PAST SURGERIES      No Known Allergies   Outpatient Medications Prior to Visit  Medication Sig Dispense Refill  . albuterol (PROVENTIL HFA;VENTOLIN HFA) 108 (90 BASE) MCG/ACT inhaler Inhale 2 puffs into the lungs every 4 (four) hours as needed for wheezing or shortness of breath. 1 Inhaler 0  . albuterol (PROVENTIL) (2.5 MG/3ML) 0.083% nebulizer solution Take 2.5 mg by nebulization every 6 (six) hours as needed for wheezing or shortness of breath.    . butalbital-acetaminophen-caffeine (FIORICET, ESGIC) 50-325-40 MG tablet Take 1 tablet by mouth every 4 (four) hours as needed for headache. 14 tablet 0  . carvedilol (COREG) 25 MG tablet Take 2 tablets (50 mg total) by mouth 2 (two) times daily with a meal. 60 tablet 1  . diphenhydramine-acetaminophen (TYLENOL PM) 25-500 MG TABS tablet Take 1-2 tablets by mouth at bedtime as needed (for sleep).    . ferrous sulfate 325 (65 FE) MG tablet Take 1 tablet (325 mg total) by mouth daily. 30 tablet 0  . furosemide (LASIX) 40 MG  tablet Take 1 tablet (40 mg total) by mouth 2 (two) times daily. 60 tablet 2  . hydrALAZINE (APRESOLINE) 100 MG tablet Take 1 tablet (100 mg total) by mouth every 8 (eight) hours. 90 tablet 1  . isosorbide mononitrate (IMDUR) 30 MG 24 hr tablet Take 1 tablet (30 mg total) by mouth daily. 30 tablet 1  . amLODipine (NORVASC) 10 MG tablet Take 1 tablet (10 mg total) by mouth daily. 30 tablet 1  . beclomethasone (QVAR) 80 MCG/ACT  inhaler Inhale 1 puff into the lungs 2 (two) times daily. (Patient taking differently: Inhale 2 puffs into the lungs 2 (two) times daily. ) 1 Inhaler 3  . topiramate (TOPAMAX) 50 MG tablet Take 1 tablet (50 mg total) by mouth 2 (two) times daily. 60 tablet 3  . nicotine (NICODERM CQ - DOSED IN MG/24 HOURS) 21 mg/24hr patch Place 1 patch (21 mg total) onto the skin daily. (Patient not taking: Reported on 10/24/2015) 28 patch 0  . oxymetazoline (AFRIN NASAL SPRAY) 0.05 % nasal spray Place 1 spray into right nostril 2 (two) times daily as needed for congestion. (Patient not taking: Reported on 10/24/2015) 30 mL 0   No facility-administered medications prior to visit.     ROS Review of Systems  Constitutional: Negative for activity change, appetite change and fatigue.  HENT: Negative for congestion, sinus pressure and sore throat.   Eyes: Negative for visual disturbance.  Respiratory: Negative for cough, chest tightness, shortness of breath and wheezing.   Cardiovascular: Negative for chest pain and palpitations.  Gastrointestinal: Negative for abdominal distention, abdominal pain and constipation.  Endocrine: Negative for polydipsia.  Genitourinary: Negative for dysuria and frequency.  Musculoskeletal: Negative for arthralgias and back pain.  Skin: Negative for rash.  Neurological: Negative for tremors, light-headedness and numbness.  Hematological: Does not bruise/bleed easily.  Psychiatric/Behavioral: Negative for agitation and behavioral problems.    Objective:  BP (!) 170/109 (BP Location: Right Arm, Patient Position: Sitting, Cuff Size: Large)   Pulse 82   Temp 98.2 F (36.8 C) (Oral)   Ht 5\' 4"  (1.626 m)   Wt 178 lb (80.7 kg)   LMP 10/13/2015 (Approximate)   SpO2 100%   BMI 30.55 kg/m   BP/Weight 10/24/2015 10/22/2015 10/16/2015  Systolic BP 170 169 225  Diastolic BP 109 80 127  Wt. (Lbs) 178 173.3 180.7  BMI 30.55 29.75 31.02      Physical Exam  Constitutional: She  is oriented to person, place, and time. She appears well-developed and well-nourished.  Cardiovascular: Normal rate, normal heart sounds and intact distal pulses.   No murmur heard. Pulmonary/Chest: Effort normal and breath sounds normal. She has no wheezes. She has no rales. She exhibits no tenderness.  Abdominal: Soft. Bowel sounds are normal. She exhibits no distension and no mass. There is no tenderness.  Musculoskeletal: Normal range of motion.  Neurological: She is alert and oriented to person, place, and time.  Skin: Skin is warm and dry.  Psychiatric:  Labile mood    Result status: Final result                              *[place]*                   *[facility]*                         [address]                        [place], [place] [address]                            [phone]  ------------------------------------------------------------------- Transthoracic Echocardiography  Patient:    Capria, Cartaya MR #:       [id number] Study Date: 10/20/2015 Gender:     F Age:        39 Height:     162.6 cm Weight:     78.9 kg BSA:        1.91 m^2 Pt. Status: Room:       [id number]   ATTENDING    Geoffery Lyons [id number]  [id number]     GUYQI, Lesle Chris  REFERRING    Vedia Coffer Lesle Chris  PERFORMING   [facility], Inpatient  SONOGRAPHER  Thurman Coyer  ADMITTING    Eliezer Bottom M  cc:  ------------------------------------------------------------------- LV EF: 45%  ------------------------------------------------------------------- Indications:      CHF - 428.0.  ------------------------------------------------------------------- History:   PMH:   Chronic obstructive pulmonary disease.  Risk factors:  Current tobacco use. Hypertension.  ------------------------------------------------------------------- Study Conclusions  - Left ventricle: The cavity size was normal. Wall thickness was   increased in a pattern of severe LVH.  Systolic function was   mildly reduced. The estimated ejection fraction was 45%. Diffuse   hypokinesis. Diastolic dysfunction, grade indeterminate. - Mitral valve: There was mild regurgitation. - Left atrium: The atrium was severely dilated. - Atrial septum: No defect or patent foramen ovale was identified. - Tricuspid valve: There was mild regurgitation.       Assessment & Plan:   1. Acute on chronic diastolic HF (heart failure) (HCC)  EF 45% Euvolemic at this time  CONTINUE Lasix, beta blocker; ACE inhibitor  On hold due to renal  Failure Has an upcoming appointment with cardiology  2. Stage 4 chronic kidney disease (HCC) Avoid nephrotoxins Currently followed by nephrology  3. Essential hypertension Uncontrolled Substituted amlodipine with nifedipine Discontinue labetalol as she is already on Coreg Low sodium diet  4. Moderate persistent asthma without complication Controlled Continue Qvar, Proventil MDI  6. Anxiety and depression Patient seems to be crying one minute and laughing the next Cannot exclude possible substance abuse I will commence Prozac She does seem to have some insight  At this time. Completed disability paper work LCSW called in for psychotherapy - Drug Screen, Urine - FLUoxetine (PROZAC) 20 MG tablet; Take 1 tablet (20 mg total) by mouth daily.  Dispense: 30 tablet; Refill: 3   Meds ordered this encounter  Medications  . hydrOXYzine (ATARAX/VISTARIL) 25 MG tablet    Sig: Take 1 tablet (25 mg total) by mouth 3 (three) times daily as needed.    Dispense:  90 tablet    Refill:  1  . FLUoxetine (PROZAC) 20 MG tablet    Sig: Take 1 tablet (20 mg total) by mouth daily.    Dispense:  30 tablet    Refill:  3  . NIFEdipine (PROCARDIA XL) 60 MG 24 hr tablet    Sig: Take 1 tablet (60 mg total) by mouth 2 (two) times daily.    Dispense:  60 tablet    Refill:  3  . topiramate (TOPAMAX) 50 MG tablet    Sig: Take 1 tablet (50 mg total) by mouth 2  (two) times daily.  Dispense:  60 tablet    Refill:  3  . beclomethasone (QVAR) 80 MCG/ACT inhaler    Sig: Inhale 2 puffs into the lungs 2 (two) times daily.    Dispense:  1 Inhaler    Refill:  3    Discontinue Advair    Follow-up: Return in about 2 weeks (around 11/07/2015) for Follow-up on congestive heart failure.   Jaclyn ShaggyEnobong Amao MD

## 2015-10-29 ENCOUNTER — Inpatient Hospital Stay: Payer: Self-pay | Admitting: Family Medicine

## 2015-10-31 ENCOUNTER — Encounter: Payer: Self-pay | Admitting: Nurse Practitioner

## 2015-10-31 ENCOUNTER — Ambulatory Visit: Payer: Self-pay | Admitting: Nurse Practitioner

## 2015-10-31 ENCOUNTER — Telehealth: Payer: Self-pay

## 2015-10-31 DIAGNOSIS — Z720 Tobacco use: Secondary | ICD-10-CM | POA: Insufficient documentation

## 2015-10-31 DIAGNOSIS — N184 Chronic kidney disease, stage 4 (severe): Secondary | ICD-10-CM | POA: Insufficient documentation

## 2015-10-31 DIAGNOSIS — I5042 Chronic combined systolic (congestive) and diastolic (congestive) heart failure: Secondary | ICD-10-CM | POA: Insufficient documentation

## 2015-10-31 DIAGNOSIS — G4733 Obstructive sleep apnea (adult) (pediatric): Secondary | ICD-10-CM | POA: Insufficient documentation

## 2015-10-31 DIAGNOSIS — I110 Hypertensive heart disease with heart failure: Secondary | ICD-10-CM | POA: Insufficient documentation

## 2015-10-31 NOTE — Telephone Encounter (Signed)
This Case Manager placed call to patient to check on status. Call placed to #[phone]; unable to reach patient. HIPPA compliant voicemail left requesting return call. In addition, call placed to #[phone]; unable to reach patient as number no longer in service.

## 2015-11-01 ENCOUNTER — Encounter: Payer: Self-pay | Admitting: *Deleted

## 2015-11-01 ENCOUNTER — Telehealth: Payer: Self-pay | Admitting: Family Medicine

## 2015-11-01 NOTE — Telephone Encounter (Signed)
Patient called the office stating that her lawyer needs another document with PCP's name and office information stated clearly. Pt is hoping letter can be written today before 5pm. I informed patient of process for documentation. Please follow up.  Thank you

## 2015-11-01 NOTE — Telephone Encounter (Signed)
The form I completed has my name and office information. She can be provided with a business card if she so desires

## 2015-11-01 NOTE — Telephone Encounter (Signed)
Patient will come by this afternoon and pick up Dr. Jen MowAmao's business cards.

## 2015-11-05 LAB — DRUG ABUSE PANEL 10-50, U
AMPHETAMINES (1000 NG/ML SCRN): NEGATIVE
BARBITURATES: POSITIVE — AB
BENZODIAZEPINES: NEGATIVE
COCAINE METABOLITES: POSITIVE — AB
MARIJUANA MET (50 NG/ML SCRN): NEGATIVE
METHADONE: NEGATIVE
METHAQUALONE: NEGATIVE
OPIATES: NEGATIVE
PHENCYCLIDINE: NEGATIVE
PROPOXYPHENE: NEGATIVE

## 2015-11-06 ENCOUNTER — Encounter: Payer: Self-pay | Admitting: Family Medicine

## 2015-11-06 ENCOUNTER — Telehealth: Payer: Self-pay

## 2015-11-06 DIAGNOSIS — F1910 Other psychoactive substance abuse, uncomplicated: Secondary | ICD-10-CM | POA: Insufficient documentation

## 2015-11-06 NOTE — Telephone Encounter (Signed)
Call placed to the patient to check on her status and to remind her of [facility].  Calls placed to [phone] (M) and the voice mailbox was full, unable to leave a message. Call placed to # [phone] (H) and the phone rang endlessly, unable to leave a message.

## 2015-11-06 NOTE — Telephone Encounter (Addendum)
April [name] fully hipaa verif per protocol.  Rn advised April [name] per Dr. Venetia NightAmao: her urine drug screen came back positive for cocaine and barbiturates (which is a class of medications that should be absent from her urine)  April [name] verbalized understanding. No further questions at this time. Pollyann KennedyKim Becton, RN, BSN

## 2015-11-07 ENCOUNTER — Ambulatory Visit: Payer: Self-pay | Attending: Family Medicine | Admitting: Family Medicine

## 2015-11-07 ENCOUNTER — Telehealth: Payer: Self-pay

## 2015-11-07 ENCOUNTER — Encounter: Payer: Self-pay | Admitting: Family Medicine

## 2015-11-07 VITALS — BP 208/110 | HR 101 | Temp 98.1°F | Ht 64.0 in | Wt 176.6 lb

## 2015-11-07 DIAGNOSIS — F329 Major depressive disorder, single episode, unspecified: Secondary | ICD-10-CM | POA: Insufficient documentation

## 2015-11-07 DIAGNOSIS — F418 Other specified anxiety disorders: Secondary | ICD-10-CM

## 2015-11-07 DIAGNOSIS — F419 Anxiety disorder, unspecified: Secondary | ICD-10-CM | POA: Insufficient documentation

## 2015-11-07 DIAGNOSIS — I110 Hypertensive heart disease with heart failure: Secondary | ICD-10-CM

## 2015-11-07 DIAGNOSIS — J4540 Moderate persistent asthma, uncomplicated: Secondary | ICD-10-CM | POA: Insufficient documentation

## 2015-11-07 DIAGNOSIS — F14980 Cocaine use, unspecified with cocaine-induced anxiety disorder: Secondary | ICD-10-CM | POA: Insufficient documentation

## 2015-11-07 DIAGNOSIS — I10 Essential (primary) hypertension: Secondary | ICD-10-CM

## 2015-11-07 DIAGNOSIS — I5033 Acute on chronic diastolic (congestive) heart failure: Secondary | ICD-10-CM | POA: Insufficient documentation

## 2015-11-07 DIAGNOSIS — F1910 Other psychoactive substance abuse, uncomplicated: Secondary | ICD-10-CM

## 2015-11-07 DIAGNOSIS — I130 Hypertensive heart and chronic kidney disease with heart failure and stage 1 through stage 4 chronic kidney disease, or unspecified chronic kidney disease: Secondary | ICD-10-CM | POA: Insufficient documentation

## 2015-11-07 DIAGNOSIS — N184 Chronic kidney disease, stage 4 (severe): Secondary | ICD-10-CM

## 2015-11-07 MED ORDER — ALBUTEROL SULFATE (2.5 MG/3ML) 0.083% IN NEBU: 2.5000 mg | INHALATION_SOLUTION | Freq: Four times a day (QID) | RESPIRATORY_TRACT | 3 refills | Status: AC | PRN

## 2015-11-07 MED ORDER — CARVEDILOL 25 MG PO TABS: 25.0000 mg | ORAL_TABLET | Freq: Two times a day (BID) | ORAL | 3 refills | Status: AC

## 2015-11-07 MED ORDER — CLONIDINE HCL 0.1 MG PO TABS
0.2000 mg | ORAL_TABLET | Freq: Once | ORAL | Status: AC
  Administered 2015-11-07: 0.2 mg via ORAL

## 2015-11-07 MED ORDER — ISOSORBIDE MONONITRATE ER 120 MG PO TB24: 120.0000 mg | ORAL_TABLET | Freq: Every day | ORAL | 3 refills | Status: AC

## 2015-11-07 NOTE — Progress Notes (Signed)
I

## 2015-11-07 NOTE — Progress Notes (Signed)
While patient in clinic for office visit, Dr. Venetia NightAmao indicated patient needed Cardiology appointment rescheduled as patient missed her appointment on 10/31/15.  Cardiology appointment scheduled on 11/11/15 at 0930 with Theodore Demarkhonda Barrett, PA. Appointment placed on AVS. Quentin AngstKay Lieb, RN informed patient of appointment.  Dr. Venetia NightAmao also informed this Case Manager that patient recently had an office visit with [facility] at [facility]. Patient informed Dr. Venetia NightAmao that Dr. Hyman HopesWebb did not want to change patient's BP medications at appointment since she was followed by multiple providers. Dr. Venetia NightAmao wanted Dr. Hyman HopesWebb to be informed that she increased isosorbide from 30 mg daily to 120 mg daily.  She also wanted patient's updated medication list to be faxed to [facility]. This Case Manager placed call to [facility] [phone] and left voicemail for Dr. Marland McalpineWebb's CMA, Randa EvensJoanne, to return call to this Case Manager. Awaiting return call.  Patient's updated medication list faxed to [phone].

## 2015-11-07 NOTE — Telephone Encounter (Signed)
Voicemail received from [place]onder[place] ColonyJoanne, [place] for Dr. Hyman HopesWebb. Call returned to inform her that Dr. Venetia NightAmao increased patient's isosorbide from 30 mg daily to 120 mg daily. Unable to reach; voicemail left requesting return call.

## 2015-11-07 NOTE — Patient Instructions (Addendum)
-  Cardiology appointment on: 11/11/15 at 9:30 am with Bjorn Loserhonda Barrett [address]   -Increase Isosorbide to 120mg  daily. -Continue with Coreg 25mg  twice daily

## 2015-11-07 NOTE — Progress Notes (Signed)
[facility] Needs nebulizer solution

## 2015-11-07 NOTE — Progress Notes (Signed)
Transitional care clinic  Date of Telephone Encounter: 10/23/15  Hospitalization dates: 10/19/15 through 10/22/15    Subjective:    Patient ID: April [name], female    DOB: [birth year], 40 y.o.   MRN: [id number]  HPI She is a 40 year old female with a history of hypertension, asthma, migraine, Stage IV CK D, noncompliance, CHF (2D echo from 10/2015 revealed EF of 45%, diffuse hypokinesis) who comes in to the transitional care  Clinic for follow-up after hospitalization. She was admitted for acute on chronic systolic and diastolic heart failure with an elevated BNP and blood pressure in the accelerated range for which she was treated with IV Lasix and antihypertensive; VQ scan was negative. She was seen by cardiology with recommendations to titrate her hydralazine and isosorbide in the event of uncontrolled blood pressure. She did have an increasing creatinine from a baseline 2.5 up to 3.6. Subsequently discharged with recommendations to follow-up with cardiology and nephrology.  She missed her appointment with cardiology and tells me she forgot; saw her Nephrologist (Dr Hyman HopesWebb) yesterday and her Bp was in the 200 systolic. Blood pressure is significantly elevated today- 188/131 and she endorses compliance with antihypertensives but does not have her medications with her today.  Complains that she is still unable to sleep as she is so anxious in her mind keeps racing despite commencement on hydroxyzine for anxiety and she is on Prozac as well. Request prescription for Xanax.  Denies shortness of breath, chest pains, pedal edema.  Past Medical History:  Diagnosis Date  . Anemia   . Asthma   . Cellulitis of right lower extremity 08/29/2015  . Chronic combined systolic and diastolic CHF (congestive heart failure) (HCC)    a. 12/2014 Echo: EF 45-50%, Gr2 DD, mild MR, mod dil LA, mod TR, PASP 47mmHg;  b. 10/2015 Echo: EF 45%, diff HK, mild MR, sev dil LA, mild TR.  Marland Kitchen. Chronic  kidney disease (CKD), stage IV (severe) (HCC)   . COPD (chronic obstructive pulmonary disease) (HCC)   . Depression   . History of migraine headaches   . Hypertensive heart disease with heart failure (HCC)   . On home oxygen therapy    "when needed" (08/29/2015)  . OSA (obstructive sleep apnea)    a. CPAP noncompliant.  . Tobacco abuse     Past Surgical History:  Procedure Laterality Date  . I&D EXTREMITY Right 09/03/2015   Procedure: IRRIGATION AND DEBRIDEMENT abcess LOWER LEG with complex closure;  Surgeon: Sheral Apleyimothy D Murphy, MD;  Location: [facility] OR;  Service: Orthopedics;  Laterality: Right;  . NO PAST SURGERIES      No Known Allergies   Review of Systems Constitutional: Negative for activity change, appetite change and fatigue.  HENT: Negative for congestion, sinus pressure and sore throat.   Eyes: Negative for visual disturbance.  Respiratory: Negative for cough, chest tightness, shortness of breath and wheezing.   Cardiovascular: Negative for chest pain and palpitations.  Gastrointestinal: Negative for abdominal distention, abdominal pain and constipation.  Endocrine: Negative for polydipsia.  Genitourinary: Negative for dysuria and frequency.  Musculoskeletal: Negative for arthralgias and back pain.  Skin: Negative for rash.  Neurological: Negative for tremors, light-headedness and numbness.  Hematological: Does not bruise/bleed easily.  Psychiatric/Behavioral: Negative for agitation and behavioral problems.  positive for anxiety      Objective: Vitals:   11/07/15 0914 11/07/15 1055  BP: (!) 188/131 (!) 208/110  Pulse: (!) 101   Temp: 98.1 F (36.7 C)   TempSrc: Oral  SpO2: 98%   Weight: 176 lb 9.6 oz (80.1 kg)   Height: 5\' 4"  (1.626 m)     Wt Readings from Last 3 Encounters:  11/07/15 176 lb 9.6 oz (80.1 kg)  10/24/15 178 lb (80.7 kg)  10/22/15 173 lb 4.8 oz (78.6 kg)      Physical Exam Constitutional: She is oriented to person, place, and time. She  appears well-developed and well-nourished.  Cardiovascular: Tachycardic rate, normal heart sounds and intact distal pulses.   No murmur heard. Pulmonary/Chest: Effort normal and breath sounds normal. She has no wheezes. She has no rales. She exhibits no tenderness.  Abdominal: Soft. Bowel sounds are normal. She exhibits no distension and no mass. There is no tenderness.  Musculoskeletal: Normal range of motion.  Neurological: She is alert and oriented to person, place, and time.  Skin: Skin is warm and dry.  Psychiatric:  Normal      CMP Latest Ref Rng & Units 10/22/2015 10/21/2015 10/20/2015  Glucose 65 - 99 mg/dL 981(X118(H) 90 91(Y64(L)  BUN 6 - 20 mg/dL 78(G36(H) 95(A33(H) 21(H31(H)  Creatinine 0.44 - 1.00 mg/dL 0.86(V3.69(H) 7.84(O3.62(H) 9.62(X3.20(H)  Sodium 135 - 145 mmol/L 138 140 140  Potassium 3.5 - 5.1 mmol/L 4.0 3.9 3.5  Chloride 101 - 111 mmol/L 105 107 107  CO2 22 - 32 mmol/L 22 24 22   Calcium 8.9 - 10.3 mg/dL 5.2(W8.6(L) 4.1(L8.5(L) 2.4(M8.5(L)  Total Protein 6.5 - 8.1 g/dL 6.1(L) 6.2(L) -  Total Bilirubin 0.3 - 1.2 mg/dL 0.1(U0.2(L) 0.4 -  Alkaline Phos 38 - 126 U/L 68 61 -  AST 15 - 41 U/L 16 18 -  ALT 14 - 54 U/L 13(L) 15 -    Assessment & Plan:  1. Acute on chronic diastolic HF (heart failure) (HCC)  EF 45%(From 10/2015) which is stable compared to 45-50% from 2-D echo 12/2014 Euvolemic at this time  CONTINUE Lasix, beta blocker; ACE inhibitor  On hold due to renal  Failure Patient missed her last cardiology appointment We have called cardiology office to obtain any appointment for her.  2. Stage 4 chronic kidney disease (HCC) Avoid nephrotoxins Currently followed by [facility]-Dr. Hyman HopesWebb We called nephrology office to reconsult medications  3. Essential hypertension Uncontrolled Clonidine 0.2 mg administered in the clinic and blood pressure recheck before discharge Increased her isosorbide from 30 mg daily to 120 mg Low sodium diet  4. Moderate persistent asthma without  complication Controlled Continue Qvar, Proventil MDI  6. Anxiety and depression Currently on Prozac and hydroxyzine She states this combination is not working and is requesting some Xanax I have informed her I will be unable to place her on short-acting benzos due to the habit forming nature especially in the setting of her positive urine drug screen   7. Substance abuse She admits to an 18 year history of crack cocaine use  Drug screen from last office visit was positive for Barbiturates and Cocaine She is currently undergoing alcohol and drug rehabilitation at her chart  This note has been created with Education officer, environmentalDragon speech recognition software and smart phrase technology. Any transcriptional errors are unintentional.

## 2015-11-08 ENCOUNTER — Telehealth: Payer: Self-pay

## 2015-11-08 NOTE — Telephone Encounter (Signed)
This Case Manager placed call once again to [facility] and spoke with Randa EvensJoanne, Dr. Marland McalpineWebb's CMA.  Informed her that patient was seen by Dr. Venetia NightAmao on 11/07/15, and Dr. Venetia NightAmao increased patient's isosorbide mononitrate from 30 mg daily to 120 mg daily. Informed her patient's patient's medication list was also faxed to office on 11/07/15 so Dr. Hyman HopesWebb is aware of patient's current medications.  Randa EvensJoanne indicated fax was received. She also indicated she was appreciative of medication update. No additional needs/concerns identified.

## 2015-11-11 ENCOUNTER — Encounter: Payer: Self-pay | Admitting: Physician Assistant

## 2015-11-11 ENCOUNTER — Ambulatory Visit (INDEPENDENT_AMBULATORY_CARE_PROVIDER_SITE_OTHER): Payer: Self-pay | Admitting: Physician Assistant

## 2015-11-11 VITALS — BP 224/132 | HR 87 | Ht 64.0 in | Wt 175.6 lb

## 2015-11-11 DIAGNOSIS — R0609 Other forms of dyspnea: Secondary | ICD-10-CM

## 2015-11-11 DIAGNOSIS — R072 Precordial pain: Secondary | ICD-10-CM

## 2015-11-11 DIAGNOSIS — I10 Essential (primary) hypertension: Secondary | ICD-10-CM

## 2015-11-11 LAB — BASIC METABOLIC PANEL
BUN: 23 mg/dL (ref 7–25)
CHLORIDE: 107 mmol/L (ref 98–110)
CO2: 22 mmol/L (ref 20–31)
Calcium: 8.6 mg/dL (ref 8.6–10.2)
Creat: 3.01 mg/dL — ABNORMAL HIGH (ref 0.50–1.10)
GLUCOSE: 73 mg/dL (ref 65–99)
POTASSIUM: 3.7 mmol/L (ref 3.5–5.3)
SODIUM: 140 mmol/L (ref 135–146)

## 2015-11-11 NOTE — Progress Notes (Signed)
Cardiology Office Note   Date:  11/11/2015   ID:  April [name], DOB 24-Nov-1975, MRN [id number]  PCP:  Jaclyn Shaggy, MD  Cardiologist:  Dr [place]  Olinda Nola, PA-C   Chief Complaint  Patient presents with  . Hospitalization Follow-up    pt was having problem breathing, pt was told that she have fluid on her heart    History of Present Illness: April [name] is a 40 y.o. female with a history of HTN, asthma, migraines, S-D-CHF w/ EF 45% 10/2014, CKD IV, COPD, depression, OSA not compliant w/ CPAP, tob use  10/11 ER visit for SOB, BP 180/123, pt wheezing, sx improved w/ nebs 10/14, ER visit for SOB, BP 214/124, given nebs, IV Lasix 40 mg. Given home BP medications and BP improved. 10/17, d/c.  10/19, UDS + cocaine and barbituates 11/01, seen by Dr Hyman Hopes and SBP > 200 11/02, seen by Dr Venetia Night and BP 188/131 w/ HR 101  April [name] presents for Evaluation of dyspnea on exertion. She is here today with her husband, who she states knows all about her medical problems and conditions.  Ms Hatchell states that her blood pressure runs high all the time. She states that she has taken her blood pressure medications this morning, everything except the aspirin. She states that her blood pressure never comes down no matter what anyone does.  She is having dyspnea on exertion. She states that she lives in a third floor apartment and has not moved despite being given the latter that says she should not have to climb the stairs. She gets dyspneic before she reaches the third floor. She feels this has worsened recently. Takes that she is compliant with her blood pressure medications and has not had cocaine in several days. She is working on a rehabilitation program and trying to get clean.  Sometimes, when she gets short of breath, she also gets chest pain. Chest pain is worse with exertion. She has not had lower extremity edema. She complains of orthopnea and  also describes waking in the not shortness of breath. However, she has sleep apnea and is not on C Pap so the exact reason for this is unclear. She has not been checking her weight, but her weight is down a pound today. She speaks very rapidly, and it is hard to keep on track. She seems restless and moves around frequently.  She feels her shortness of breath is severe, but does not have problems speaking and moving around, getting up on the exam table etc. without a significant increase in her respiratory rate. Her heart rate is slightly elevated at 87 at baseline. After she had been in the office and had been sitting more commonly for several minutes, I rechecked her blood pressure and it was 204 systolic.   Past Medical History:  Diagnosis Date  . Anemia   . Asthma   . Cellulitis of right lower extremity 08/29/2015  . Chronic combined systolic and diastolic CHF (congestive heart failure) (HCC)    a. 12/2014 Echo: EF 45-50%, Gr2 DD, mild MR, mod dil LA, mod TR, PASP ;  b. 10/2015 Echo: EF 45%, diff HK, mild MR, sev dil LA, mild TR.  Marland Kitchen Chronic kidney disease (CKD), stage IV (severe) (HCC)   . COPD (chronic obstructive pulmonary disease) (HCC)   . Depression   . History of migraine headaches   . Hypertensive heart disease with heart failure (HCC)   . On home oxygen therapy    "  when needed" (08/29/2015)  . OSA (obstructive sleep apnea)    a. CPAP noncompliant.  . Tobacco abuse     Past Surgical History:  Procedure Laterality Date  . I&D EXTREMITY Right 09/03/2015   Procedure: IRRIGATION AND DEBRIDEMENT abcess LOWER LEG with complex closure;  Surgeon: Sheral Apleyimothy D Murphy, MD;  Location: [facility] OR;  Service: Orthopedics;  Laterality: Right;  . NO PAST SURGERIES      Medication Sig  . albuterol (PROVENTIL HFA;VENTOLIN HFA) 108 (90 BASE) MCG/ACT inhaler Inhale 2 puffs into the lungs every 4 (four) hours as needed for wheezing or shortness of breath.  Marland Kitchen. albuterol (PROVENTIL) (2.5 MG/3ML)  0.083% nebulizer solution Take 3 mLs (2.5 mg total) by nebulization every 6 (six) hours as needed for wheezing or shortness of breath.  . beclomethasone (QVAR) 80 MCG/ACT inhaler Inhale 2 puffs into the lungs 2 (two) times daily.  . butalbital-acetaminophen-caffeine (FIORICET, ESGIC) 50-325-40 MG tablet Take 1 tablet by mouth every 4 (four) hours as needed for headache.  . carvedilol (COREG) 25 MG tablet Take 1 tablet (25 mg total) by mouth 2 (two) times daily with a meal.  . diphenhydramine-acetaminophen (TYLENOL PM) 25-500 MG TABS tablet Take 1-2 tablets by mouth at bedtime as needed (for sleep).  . ferrous sulfate 325 (65 FE) MG tablet Take 1 tablet (325 mg total) by mouth daily.  Marland Kitchen. FLUoxetine (PROZAC) 20 MG tablet Take 1 tablet (20 mg total) by mouth daily.  . furosemide (LASIX) 40 MG tablet Take 1 tablet (40 mg total) by mouth 2 (two) times daily.  . hydrALAZINE (APRESOLINE) 100 MG tablet Take 1 tablet (100 mg total) by mouth every 8 (eight) hours.  . hydrOXYzine (ATARAX/VISTARIL) 25 MG tablet Take 1 tablet (25 mg total) by mouth 3 (three) times daily as needed.  . isosorbide mononitrate (IMDUR) 120 MG 24 hr tablet Take 1 tablet (120 mg total) by mouth daily.  Marland Kitchen. NIFEdipine (PROCARDIA XL) 60 MG 24 hr tablet Take 1 tablet (60 mg total) by mouth 2 (two) times daily.  Marland Kitchen. oxymetazoline (AFRIN NASAL SPRAY) 0.05 % nasal spray Place 1 spray into right nostril 2 (two) times daily as needed for congestion.  . topiramate (TOPAMAX) 50 MG tablet Take 1 tablet (50 mg total) by mouth 2 (two) times daily.   No current facility-administered medications for this visit.     Allergies:   Patient has no known allergies.    Social History:  The patient  reports that she has been smoking Cigarettes.  She has a 12.00 pack-year smoking history. She has never used smokeless tobacco. She reports that she uses drugs, including "Crack" cocaine and Marijuana. She reports that she does not drink alcohol.   Family  History:  The patient's family history includes Cancer in her father, maternal grandfather, and paternal grandfather; Diabetes in her maternal grandmother and mother; Hypertension in her mother.    ROS:  Please see the history of present illness. All other systems are reviewed and negative.    PHYSICAL EXAM: VS:  BP (!) 224/132 (BP Location: Right Arm, Cuff Size: Normal)   Pulse 87   Ht 5\' 4"  (1.626 m)   Wt 175 lb 9.6 oz (79.7 kg)   LMP 10/13/2015 (Approximate)   BMI 30.14 kg/m  , BMI Body mass index is 30.14 kg/m. GEN: Well nourished, well developed, female in no acute distress  HEENT: normal for age  Neck: no JVD, no carotid bruit, no masses; strong carotid pulse noted Cardiac: RRR; soft murmur,  no rubs, or gallops Respiratory: Decreased breath sounds bases, but clear to auscultation bilaterally, normal work of breathing GI: soft, nontender, nondistended, + BS MS: no deformity or atrophy; no edema; distal pulses are 2+ in all 4 extremities   Skin: warm and dry, no rash Neuro:  Strength and sensation are intact Psych: euthymic mood, full affect   EKG:  EKG is ordered today. The ekg ordered today demonstrates sinus rhythm, rate 87, no acute ischemic changes, LVH  ECHO: 10/20/2015 - Left ventricle: The cavity size was normal. Wall thickness was increased in a pattern of severe LVH. Systolic function was mildly reduced. The estimated ejection fraction was 45%. Diffuse  hypokinesis. Diastolic dysfunction, grade indeterminate. - Mitral valve: There was mild regurgitation. - Left atrium: The atrium was severely dilated. - Atrial septum: No defect or patent foramen ovale was identified. - Tricuspid valve: There was mild regurgitation.  Recent Labs: 01/10/2015: TSH 2.044 10/19/2015: B Natriuretic Peptide 1,892.1 10/20/2015: Hemoglobin 8.3; Platelets 302 10/22/2015: ALT 13; BUN 36; Creatinine, Ser 3.69; Potassium 4.0; Sodium 138    Lipid Panel No results found for: CHOL, TRIG, HDL,  CHOLHDL, VLDL, LDLCALC, LDLDIRECT   Wt Readings from Last 3 Encounters:  11/11/15 175 lb 9.6 oz (79.7 kg)  11/07/15 176 lb 9.6 oz (80.1 kg)  10/24/15 178 lb (80.7 kg)     Other studies Reviewed: Additional studies/ records that were reviewed today include: Hospital records, office notes and testing.  ASSESSMENT AND PLAN:  1.  Hypertension: Long discussion with the patient and her husband regarding the need to stay off cocaine and be compliant with her medications before we can figure out what changes to make. Because of her renal insufficiency I would prefer to let Dr Hyman HopesWebb manage her Lasix. She is on high doses of carvedilol, hydralazine, Imdur, and Procardia. The patient does not wish to increase her hydralazine dose, and with a recent drug screen positive for cocaine, I am reluctant to increase her Coreg as it is already at a high dose. No med changes at this time.  2. Dyspnea on exertion: Her weight is down a pound and her oxygen saturation is good on room air. I did not get significant volume overload by physical exam. She is also not wheezing. We will check a BNP.  3. Chronic kidney disease: She is followed by Dr. Hyman HopesWebb. We will check a BMET.  4. Chest pain: This seems mostly related to shortness of breath. Shortness of breath comes first, and then she will get the chest pain that she believes hurts worse with deep inspiration. Discuss with M.D.,she may need a stress test.   Current medicines are reviewed at length with the patient today.  The patient does not have concerns regarding medicines.  The following changes have been made:  no change  Labs/ tests ordered today include:   Orders Placed This Encounter  Procedures  . Brain natriuretic peptide  . Basic metabolic panel  . EKG 12-Lead     Disposition:   FU with Dr. [place]  Signed, Theodore DemarkBarrett, Ryken Paschal, PA-C  11/11/2015 11:50 AM    [facility] Phone: [phone]; Fax: [phone]  This note  was written with the assistance of speech recognition software. Please excuse any transcriptional errors.

## 2015-11-11 NOTE — Patient Instructions (Addendum)
Medication Instructions:  NO CHANGES   If you need a refill on your cardiac medications before your next appointment, please call your pharmacy.  Labwork: BNP AND BMET TODAY AT [facility] ON THE 1ST FLOOR   Follow-Up: Your physician recommends that you schedule a follow-up appointment in: 1 MONTH WITH DR [place]azilandJORDAN   Any Other Special Instructions Will Be Listed Below (If Applicable).    Thank you for choosing [facility]!!

## 2015-11-12 LAB — BRAIN NATRIURETIC PEPTIDE: Brain Natriuretic Peptide: 1556.8 pg/mL — ABNORMAL HIGH (ref <100)

## 2015-11-15 ENCOUNTER — Telehealth: Payer: Self-pay

## 2015-11-15 NOTE — Telephone Encounter (Signed)
This Case Manager placed call to patient to check on status. Patient indicated she was "doing a little better." Patient informed this Case Manager she was working hard to "get sober". Patient indicated she has not had any drugs or alcohol for 7 days. Congratulated patient on her work so far to "get sober" and strongly encouraged patient to continue abstaining from drugs and alcohol. Patient verbalized understanding.  Patient indicated she could not take the isosorbide 120 mg daily that Dr. Venetia NightAmao prescribed on 11/07/15. She said she took medication once and shortly after taking medication she had a "headache so bad [she] could not see." Patient indicated she stopped taking medication. This Case Manager informed patient that this would be discussed with Dr. Venetia NightAmao.    Patient indicated she went to her Cardiology appointment on 11/11/15. She said she was told she was "retaining fluid" and that her creatinine was elevated so she should follow-up with Dr. Hyman HopesWebb as soon as possible. Patient said she went to follow-up with Dr. Hyman HopesWebb as instructed. Patient voiced her frustration that her hypertension medications are being frequently switched by different physicians.  This Case Manager spoke with Dr. Venetia NightAmao about patient's headache with taking increased dose of isosorbide and patient's frustrations over her hypertension medications being changed frequently changed by different physicians. Dr. Venetia NightAmao said at this point to streamline patient's care, patient's Nephrologist should manage patient's hypertension. She indicated patient should call Nephrologist to inform them of this and to also inform of patient's headache after taking isosorbide 120 mg daily.  Dr. Venetia NightAmao advised that patient keep a log of her BP and call Nephrology office with elevated blood pressures. This Case Manager placed call to patient to inform her of Dr. Jen MowAmao's instructions. Patient was pleased and indicated she would call Dr. Marland McalpineWebb's office when she got off the  phone with this Case Manager. Reminded patient of her Transitional Care Clinic follow-up appointment on 11/21/15 at 0945 with Dr. Venetia NightAmao, and patient verbalized understanding. No additional needs/concerns identified.

## 2015-11-20 ENCOUNTER — Telehealth: Payer: Self-pay

## 2015-11-20 NOTE — Telephone Encounter (Signed)
Attempted to contact the patient to check on her status and to remind [facility] - 11/21/15 @ 0945. Call placed to # [phone] (M) and the voice mailbox was was full .  Call placed to #  [phone] (H) and a HIPAA compliant voicemail message was left requesting a call back to # [phone] or [phone].

## 2015-11-21 ENCOUNTER — Ambulatory Visit: Payer: Self-pay | Admitting: Family Medicine

## 2015-11-21 ENCOUNTER — Telehealth: Payer: Self-pay

## 2015-11-21 NOTE — Telephone Encounter (Signed)
This Case Manager placed call to patient to remind her of her Transitional Care follow-up appointment today at 0945. Patient indicated she needed to reschedule appointment because she is "not feeling good." Strongly encouraged patient to come to clinic for examination but patient said she "just had a cold" and needed to reschedule. Appointment rescheduled for 11/25/15 at 1400. No additional needs identified.

## 2015-11-22 ENCOUNTER — Telehealth: Payer: Self-pay

## 2015-11-22 NOTE — Telephone Encounter (Signed)
This Case Manager placed call to patient to remind her of upcoming Transitional Care Clinic appointment on 11/25/15 at 1400. Call placed to #[phone]; however, unable to reach patient. HIPPA compliant voicemail left requesting return call.

## 2015-11-25 ENCOUNTER — Telehealth: Payer: Self-pay

## 2015-11-25 ENCOUNTER — Ambulatory Visit: Payer: Self-pay | Admitting: Family Medicine

## 2015-11-25 NOTE — Telephone Encounter (Signed)
Attempted to contact the patient to confirm her appointment for this afternoon @ 1400.  Call placed to # [phone] and a HIPAA compliant voicemail message was left requesting a call back to # [phone] or [phone].  Call also placed to # [phone] and the voice mailbox was full, unable to leave a message.

## 2015-11-26 ENCOUNTER — Telehealth: Payer: Self-pay

## 2015-11-26 NOTE — Telephone Encounter (Signed)
This Case Manager placed call to patient to check status and to discuss rescheduling Transitional Care follow-up appointment as patient missed appointment on 11/25/15. Call placed to #[phone]; however, unable to reach patient. HIPPA compliant voicemail left requesting return call.

## 2015-12-02 ENCOUNTER — Telehealth: Payer: Self-pay

## 2015-12-02 NOTE — Telephone Encounter (Signed)
Attempted to contact the patient to check on her status and to discuss scheduling a follow up appointment at the [facility].  Call placed to # [phone] (H) and a HIPAA compliant voicemail message was left requesting a call back to # [phone] or [phone].

## 2015-12-03 ENCOUNTER — Emergency Department (HOSPITAL_COMMUNITY)
Admission: EM | Admit: 2015-12-03 | Discharge: 2015-12-03 | Disposition: A | Discharge status: Home / Self Care | Payer: Self-pay | Attending: Emergency Medicine | Admitting: Emergency Medicine

## 2015-12-03 ENCOUNTER — Encounter (HOSPITAL_COMMUNITY): Payer: Self-pay

## 2015-12-03 DIAGNOSIS — N184 Chronic kidney disease, stage 4 (severe): Secondary | ICD-10-CM | POA: Insufficient documentation

## 2015-12-03 DIAGNOSIS — F17210 Nicotine dependence, cigarettes, uncomplicated: Secondary | ICD-10-CM | POA: Insufficient documentation

## 2015-12-03 DIAGNOSIS — Z79899 Other long term (current) drug therapy: Secondary | ICD-10-CM | POA: Insufficient documentation

## 2015-12-03 DIAGNOSIS — I5042 Chronic combined systolic (congestive) and diastolic (congestive) heart failure: Secondary | ICD-10-CM | POA: Insufficient documentation

## 2015-12-03 DIAGNOSIS — I130 Hypertensive heart and chronic kidney disease with heart failure and stage 1 through stage 4 chronic kidney disease, or unspecified chronic kidney disease: Secondary | ICD-10-CM | POA: Insufficient documentation

## 2015-12-03 DIAGNOSIS — J069 Acute upper respiratory infection, unspecified: Secondary | ICD-10-CM | POA: Insufficient documentation

## 2015-12-03 DIAGNOSIS — J4521 Mild intermittent asthma with (acute) exacerbation: Secondary | ICD-10-CM | POA: Insufficient documentation

## 2015-12-03 MED ORDER — HYDROCODONE-HOMATROPINE 5-1.5 MG/5ML PO SYRP: 5.0000 mL | ORAL_SOLUTION | Freq: Four times a day (QID) | ORAL | 0 refills | Status: AC | PRN

## 2015-12-03 MED ORDER — ALBUTEROL SULFATE HFA 108 (90 BASE) MCG/ACT IN AERS
2.0000 | INHALATION_SPRAY | RESPIRATORY_TRACT | Status: DC | PRN
  Administered 2015-12-03: 2 via RESPIRATORY_TRACT
  Filled 2015-12-03: qty 6.7

## 2015-12-03 MED ORDER — PREDNISONE 20 MG PO TABS
60.0000 mg | ORAL_TABLET | Freq: Once | ORAL | Status: AC
  Administered 2015-12-03: 60 mg via ORAL
  Filled 2015-12-03: qty 3

## 2015-12-03 MED ORDER — PREDNISONE 20 MG PO TABS: 60.0000 mg | ORAL_TABLET | Freq: Every day | ORAL | 0 refills | Status: AC

## 2015-12-03 MED ORDER — GUAIFENESIN-CODEINE 100-10 MG/5ML PO SOLN
10.0000 mL | Freq: Once | ORAL | Status: AC
  Administered 2015-12-03: 10 mL via ORAL
  Filled 2015-12-03: qty 10

## 2015-12-03 NOTE — ED Triage Notes (Signed)
Pt states that she has had a productive cough for the past 4-5 days, unrelieved by OTC medications, reports yellow colored phlegm. Reports chills earlier.

## 2015-12-03 NOTE — ED Provider Notes (Signed)
[facility]-EMERGENCY DEPT Provider Note   CSN: [id number] Arrival date & time: 12/03/15  [id number]     History   Chief Complaint Chief Complaint  Patient presents with  . Cough    HPI April [name] is a 40 y.o. female.  Patient presents with complaints of progressively worsening cough for the last 4-5 days. Cough has been productive of yellow sputum. She has been using over-the-counter cold and decongestant medication without improvement. She does have a history of asthma. She uses nebulizer and inhaler, but has misplaced her inhaler.      Past Medical History:  Diagnosis Date  . Anemia   . Asthma   . Cellulitis of right lower extremity 08/29/2015  . Chronic combined systolic and diastolic CHF (congestive heart failure) (HCC)    a. 12/2014 Echo: EF 45-50%, Gr2 DD, mild MR, mod dil LA, mod TR, PASP 47mmHg;  b. 10/2015 Echo: EF 45%, diff HK, mild MR, sev dil LA, mild TR.  Marland Kitchen. Chronic kidney disease (CKD), stage IV (severe) (HCC)   . COPD (chronic obstructive pulmonary disease) (HCC)   . Depression   . History of migraine headaches   . Hypertensive heart disease with heart failure (HCC)   . On home oxygen therapy    "when needed" (08/29/2015)  . OSA (obstructive sleep apnea)    a. CPAP noncompliant.  . Tobacco abuse     Patient Active Problem List   Diagnosis Date Noted  . Substance abuse 11/06/2015  . Hypertensive heart disease with heart failure (HCC)   . Tobacco abuse   . OSA (obstructive sleep apnea)   . Chronic kidney disease (CKD), stage IV (severe) (HCC)   . Chronic combined systolic and diastolic CHF (congestive heart failure) (HCC)   . Anxiety and depression 10/24/2015  . Hypertensive heart disease 10/20/2015  . CHF exacerbation (HCC) 10/19/2015  . Acute on chronic diastolic HF (heart failure) (HCC) 10/19/2015  . CHF (congestive heart failure) (HCC) 10/19/2015  . COPD (chronic obstructive pulmonary disease) (HCC) 10/16/2015  . Acute exacerbation of CHF  (congestive heart failure) (HCC) 10/16/2015  . Iron deficiency anemia   . Chronic diastolic congestive heart failure (HCC)   . Cellulitis of right lower extremity 08/29/2015  . Vitamin D deficiency 01/11/2015  . Fatigue 01/10/2015  . Dyspnea 01/10/2015  . Migraine 10/17/2014  . Chronic kidney disease 10/17/2014  . Asthma 09/04/2014  . Essential hypertension 09/03/2014  . Congestive heart failure (HCC) 09/03/2014    Past Surgical History:  Procedure Laterality Date  . I&D EXTREMITY Right 09/03/2015   Procedure: IRRIGATION AND DEBRIDEMENT abcess LOWER LEG with complex closure;  Surgeon: Sheral Apleyimothy D Murphy, MD;  Location: [facility] OR;  Service: Orthopedics;  Laterality: Right;  . NO PAST SURGERIES      OB History    No data available       Home Medications    Prior to Admission medications   Medication Sig Start Date End Date Taking? Authorizing Provider  albuterol (PROVENTIL HFA;VENTOLIN HFA) 108 (90 BASE) MCG/ACT inhaler Inhale 2 puffs into the lungs every 4 (four) hours as needed for wheezing or shortness of breath. 10/16/14   Kristen N Ward, DO  albuterol (PROVENTIL) (2.5 MG/3ML) 0.083% nebulizer solution Take 3 mLs (2.5 mg total) by nebulization every 6 (six) hours as needed for wheezing or shortness of breath. 11/07/15   Jaclyn ShaggyEnobong Amao, MD  beclomethasone (QVAR) 80 MCG/ACT inhaler Inhale 2 puffs into the lungs 2 (two) times daily. 10/24/15   Jaclyn ShaggyEnobong Amao, MD  butalbital-acetaminophen-caffeine (FIORICET, ESGIC) 50-325-40 MG tablet Take 1 tablet by mouth every 4 (four) hours as needed for headache. 10/22/15   Richarda OverlieNayana Abrol, MD  carvedilol (COREG) 25 MG tablet Take 1 tablet (25 mg total) by mouth 2 (two) times daily with a meal. 11/07/15   Jaclyn ShaggyEnobong Amao, MD  diphenhydramine-acetaminophen (TYLENOL PM) 25-500 MG TABS tablet Take 1-2 tablets by mouth at bedtime as needed (for sleep).    Historical Provider, MD  ferrous sulfate 325 (65 FE) MG tablet Take 1 tablet (325 mg total) by mouth daily.  08/29/15   Gilda Creasehristopher J Inocente Krach, MD  FLUoxetine (PROZAC) 20 MG tablet Take 1 tablet (20 mg total) by mouth daily. 10/24/15   Jaclyn ShaggyEnobong Amao, MD  furosemide (LASIX) 40 MG tablet Take 1 tablet (40 mg total) by mouth 2 (two) times daily. 10/22/15   Richarda OverlieNayana Abrol, MD  hydrALAZINE (APRESOLINE) 100 MG tablet Take 1 tablet (100 mg total) by mouth every 8 (eight) hours. 10/22/15   Richarda OverlieNayana Abrol, MD  hydrOXYzine (ATARAX/VISTARIL) 25 MG tablet Take 1 tablet (25 mg total) by mouth 3 (three) times daily as needed. 10/24/15   Jaclyn ShaggyEnobong Amao, MD  isosorbide mononitrate (IMDUR) 120 MG 24 hr tablet Take 1 tablet (120 mg total) by mouth daily. 11/07/15   Jaclyn ShaggyEnobong Amao, MD  NIFEdipine (PROCARDIA XL) 60 MG 24 hr tablet Take 1 tablet (60 mg total) by mouth 2 (two) times daily. 10/24/15   Jaclyn ShaggyEnobong Amao, MD  oxymetazoline (AFRIN NASAL SPRAY) 0.05 % nasal spray Place 1 spray into right nostril 2 (two) times daily as needed for congestion. 04/05/15   Audry Piliyler Mohr, PA-C  topiramate (TOPAMAX) 50 MG tablet Take 1 tablet (50 mg total) by mouth 2 (two) times daily. 10/24/15   Jaclyn ShaggyEnobong Amao, MD    Family History Family History  Problem Relation Age of Onset  . Diabetes Mother   . Hypertension Mother   . Cancer Father   . Diabetes Maternal Grandmother   . Cancer Maternal Grandfather   . Cancer Paternal Grandfather     Social History Social History  Substance Use Topics  . Smoking status: Current Every Day Smoker    Packs/day: 0.60    Years: 20.00    Types: Cigarettes  . Smokeless tobacco: Never Used     Comment: 4 cigs daily  . Alcohol use No     Comment: last time one month ago     Allergies   Patient has no known allergies.   Review of Systems Review of Systems  HENT: Positive for voice change.   Respiratory: Positive for cough.   All other systems reviewed and are negative.    Physical Exam Updated Vital Signs BP (!) 216/114   Pulse 94   Temp 98.6 F (37 C) (Oral)   Resp 18   Ht 5\' 4"  (1.626 m)   Wt  170 lb (77.1 kg)   LMP 11/18/2015   SpO2 100%   BMI 29.18 kg/m   Physical Exam  Constitutional: She is oriented to person, place, and time. She appears well-developed and well-nourished. No distress.  HENT:  Head: Normocephalic and atraumatic.  Right Ear: Hearing normal.  Left Ear: Hearing normal.  Nose: Nose normal.  Mouth/Throat: Oropharynx is clear and moist and mucous membranes are normal. No tonsillar abscesses.  Voice is very hoarse  Eyes: Conjunctivae and EOM are normal. Pupils are equal, round, and reactive to light.  Neck: Normal range of motion. Neck supple.  Cardiovascular: Regular rhythm, S1 normal and S2 normal.  Exam reveals no gallop and no friction rub.   No murmur heard. Pulmonary/Chest: Effort normal and breath sounds normal. No respiratory distress. She exhibits no tenderness.  Abdominal: Soft. Normal appearance and bowel sounds are normal. There is no hepatosplenomegaly. There is no tenderness. There is no rebound, no guarding, no tenderness at McBurney's point and negative Murphy's sign. No hernia.  Musculoskeletal: Normal range of motion.  Neurological: She is alert and oriented to person, place, and time. She has normal strength. No cranial nerve deficit or sensory deficit. Coordination normal. GCS eye subscore is 4. GCS verbal subscore is 5. GCS motor subscore is 6.  Skin: Skin is warm, dry and intact. No rash noted. No cyanosis.  Psychiatric: She has a normal mood and affect. Her speech is normal and behavior is normal. Thought content normal.  Nursing note and vitals reviewed.    ED Treatments / Results  Labs (all labs ordered are listed, but only abnormal results are displayed) Labs Reviewed - No data to display  EKG  EKG Interpretation None       Radiology No results found.  Procedures Procedures (including critical care time)  Medications Ordered in ED Medications  guaiFENesin-codeine 100-10 MG/5ML solution 10 mL (not administered)    predniSONE (DELTASONE) tablet 60 mg (not administered)  albuterol (PROVENTIL HFA;VENTOLIN HFA) 108 (90 Base) MCG/ACT inhaler 2 puff (not administered)     Initial Impression / Assessment and Plan / ED Course  I have reviewed the triage vital signs and the nursing notes.  Pertinent labs & imaging results that were available during my care of the patient were reviewed by me and considered in my medical decision making (see chart for details).  Clinical Course    Patient presents with complaints of cough and congestion for 4 or 5 days. Cough has been worsening. Patient reports that she has been unable to sleep because the cough has been keeping her up. She does have a history of asthma, currently only has nebulizer and has not been using it. She has lost her inhaler. Lungs are clear currently. No clinical concern for pneumonia. Oxygenation 100% on room air. No current wheezing.  Bloos Pressure is elevated. She has a history of essential hypertension, on multiple medications. She is due for her morning meds. She is asymptomatic. She will take her medications when she gets home.  Patient will be initiated on prednisone and continue albuterol. She was given an inhaler to use every 4 hours. Presentation is consistent with viral upper respiratory infection. Will prescribe Hycodan for cough.  Final Clinical Impressions(s) / ED Diagnoses   Final diagnoses:  Viral upper respiratory tract infection  Mild intermittent asthma with acute exacerbation    New Prescriptions New Prescriptions   No medications on file     Gilda Crease, MD 12/03/15 [phone]

## 2015-12-11 NOTE — Progress Notes (Deleted)
Cardiology Office Note    Date:  12/11/2015   ID:  April [name], DOB 1975-01-26, MRN [id number]  PCP:  Jaclyn ShaggyEnobong, Amao, MD  Cardiologist:  Gean Larose [place], MD    History of Present Illness:  April [name] is a 40 y.o. female seen for follow up of CHF and hypertensive heart disease. She was admitted in October with acute on chronic CHF. She was severely hypertensive. Noncompliant with medical therapy. She has a history  of combined CHF, CKD IV, HTN, Migraines, tobacco abuse, and OSA. Last Echo showed EF of 45%. She was diuresed and started of multiple antihypertensive medications. Noted in hospital records that she is noncompliant with all aspects of her care. Eats a poor high sodium diet. Continues to smoke. Doesn't take her medications as prescribed. States her BP is always high and medication doesn't help. Has history of CHF for last 2-3 years. UDS positive for cocaine. She is followed by Dr. Hyman HopesWebb in Nephrology.     Past Medical History:  Diagnosis Date  . Anemia   . Asthma   . Cellulitis of right lower extremity 08/29/2015  . Chronic combined systolic and diastolic CHF (congestive heart failure) (HCC)    a. 12/2014 Echo: EF 45-50%, Gr2 DD, mild MR, mod dil LA, mod TR, PASP 47mmHg;  b. 10/2015 Echo: EF 45%, diff HK, mild MR, sev dil LA, mild TR.  Marland Kitchen. Chronic kidney disease (CKD), stage IV (severe) (HCC)   . COPD (chronic obstructive pulmonary disease) (HCC)   . Depression   . History of migraine headaches   . Hypertensive heart disease with heart failure (HCC)   . On home oxygen therapy    "when needed" (08/29/2015)  . OSA (obstructive sleep apnea)    a. CPAP noncompliant.  . Tobacco abuse     Past Surgical History:  Procedure Laterality Date  . I&D EXTREMITY Right 09/03/2015   Procedure: IRRIGATION AND DEBRIDEMENT abcess LOWER LEG with complex closure;  Surgeon: Sheral Apleyimothy D Murphy, MD;  Location: [facility] OR;  Service: Orthopedics;  Laterality: Right;  . NO PAST  SURGERIES      Current Medications: Outpatient Medications Prior to Visit  Medication Sig Dispense Refill  . albuterol (PROVENTIL HFA;VENTOLIN HFA) 108 (90 BASE) MCG/ACT inhaler Inhale 2 puffs into the lungs every 4 (four) hours as needed for wheezing or shortness of breath. 1 Inhaler 0  . albuterol (PROVENTIL) (2.5 MG/3ML) 0.083% nebulizer solution Take 3 mLs (2.5 mg total) by nebulization every 6 (six) hours as needed for wheezing or shortness of breath. 75 mL 3  . beclomethasone (QVAR) 80 MCG/ACT inhaler Inhale 2 puffs into the lungs 2 (two) times daily. 1 Inhaler 3  . butalbital-acetaminophen-caffeine (FIORICET, ESGIC) 50-325-40 MG tablet Take 1 tablet by mouth every 4 (four) hours as needed for headache. 14 tablet 0  . carvedilol (COREG) 25 MG tablet Take 1 tablet (25 mg total) by mouth 2 (two) times daily with a meal. 60 tablet 3  . diphenhydramine-acetaminophen (TYLENOL PM) 25-500 MG TABS tablet Take 1-2 tablets by mouth at bedtime as needed (for sleep).    . ferrous sulfate 325 (65 FE) MG tablet Take 1 tablet (325 mg total) by mouth daily. 30 tablet 0  . FLUoxetine (PROZAC) 20 MG tablet Take 1 tablet (20 mg total) by mouth daily. 30 tablet 3  . furosemide (LASIX) 40 MG tablet Take 1 tablet (40 mg total) by mouth 2 (two) times daily. 60 tablet 2  . hydrALAZINE (APRESOLINE) 100 MG tablet Take 1  tablet (100 mg total) by mouth every 8 (eight) hours. 90 tablet 1  . HYDROcodone-homatropine (HYCODAN) 5-1.5 MG/5ML syrup Take 5 mLs by mouth every 6 (six) hours as needed for cough. 75 mL 0  . hydrOXYzine (ATARAX/VISTARIL) 25 MG tablet Take 1 tablet (25 mg total) by mouth 3 (three) times daily as needed. 90 tablet 1  . isosorbide mononitrate (IMDUR) 120 MG 24 hr tablet Take 1 tablet (120 mg total) by mouth daily. 30 tablet 3  . NIFEdipine (PROCARDIA XL) 60 MG 24 hr tablet Take 1 tablet (60 mg total) by mouth 2 (two) times daily. 60 tablet 3  . oxymetazoline (AFRIN NASAL SPRAY) 0.05 % nasal spray  Place 1 spray into right nostril 2 (two) times daily as needed for congestion. 30 mL 0  . predniSONE (DELTASONE) 20 MG tablet Take 3 tablets (60 mg total) by mouth daily with breakfast. 15 tablet 0  . topiramate (TOPAMAX) 50 MG tablet Take 1 tablet (50 mg total) by mouth 2 (two) times daily. 60 tablet 3   No facility-administered medications prior to visit.      Allergies:   Patient has no known allergies.   Social History   Social History  . Marital status: Married    Spouse name: N/A  . Number of children: N/A  . Years of education: N/A   Social History Main Topics  . Smoking status: Current Every Day Smoker    Packs/day: 0.60    Years: 20.00    Types: Cigarettes  . Smokeless tobacco: Never Used     Comment: 4 cigs daily  . Alcohol use No     Comment: last time one month ago  . Drug use:     Types: "Crack" cocaine, Marijuana     Comment: 4-5 days ago used cocaine and marijuana  . Sexual activity: Yes   Other Topics Concern  . Not on file   Social History Narrative  . No narrative on file     Family History:  The patient's family history includes Cancer in her father, maternal grandfather, and paternal grandfather; Diabetes in her maternal grandmother and mother; Hypertension in her mother.   ROS:   Please see the history of present illness.    ROS All other systems reviewed and are negative.   PHYSICAL EXAM:   VS:  LMP 11/18/2015    GEN: Well nourished, well developed, in no acute distress  HEENT: normal  Neck: no JVD, carotid bruits, or masses Cardiac: RRR; no murmurs, rubs, or gallops,no edema  Respiratory:  clear to auscultation bilaterally, normal work of breathing GI: soft, nontender, nondistended, + BS MS: no deformity or atrophy  Skin: warm and dry, no rash Neuro:  Alert and Oriented x 3, Strength and sensation are intact Psych: euthymic mood, full affect  Wt Readings from Last 3 Encounters:  12/03/15 170 lb (77.1 kg)  11/11/15 175 lb 9.6 oz (79.7  kg)  11/07/15 176 lb 9.6 oz (80.1 kg)      Studies/Labs Reviewed:   EKG:  EKG is*** ordered today.  The ekg ordered today demonstrates ***  Recent Labs: 01/10/2015: TSH 2.044 10/20/2015: Hemoglobin 8.3; Platelets 302 10/22/2015: ALT 13 11/11/2015: Brain Natriuretic Peptide 1,556.8; BUN 23; Creat 3.01; Potassium 3.7; Sodium 140   Lipid Panel No results found for: CHOL, TRIG, HDL, CHOLHDL, VLDL, LDLCALC, LDLDIRECT  Additional studies/ records that were reviewed today include:  Echo: 10/20/15:Study Conclusions  - Left ventricle: The cavity size was normal. Wall thickness was  increased in a pattern of severe LVH. Systolic function was   mildly reduced. The estimated ejection fraction was 45%. Diffuse   hypokinesis. Diastolic dysfunction, grade indeterminate. - Mitral valve: There was mild regurgitation. - Left atrium: The atrium was severely dilated. - Atrial septum: No defect or patent foramen ovale was identified. - Tricuspid valve: There was mild regurgitation.  ASSESSMENT:    No diagnosis found.   PLAN:  In order of problems listed above:  1. ***    Medication Adjustments/Labs and Tests Ordered: Current medicines are reviewed at length with the patient today.  Concerns regarding medicines are outlined above.  Medication changes, Labs and Tests ordered today are listed in the Patient Instructions below. There are no Patient Instructions on file for this visit.   Signed, Siddhartha Hoback [place], MD  12/11/2015 1:43 PM    [facility] [address], [place], [place], [address] [phone]

## 2015-12-13 ENCOUNTER — Ambulatory Visit: Payer: Self-pay | Admitting: Cardiology

## 2015-12-13 ENCOUNTER — Encounter: Payer: Self-pay | Admitting: *Deleted

## 2015-12-18 ENCOUNTER — Encounter (HOSPITAL_COMMUNITY): Payer: Self-pay | Admitting: *Deleted

## 2015-12-18 DIAGNOSIS — R600 Localized edema: Secondary | ICD-10-CM | POA: Insufficient documentation

## 2015-12-18 DIAGNOSIS — J449 Chronic obstructive pulmonary disease, unspecified: Secondary | ICD-10-CM | POA: Insufficient documentation

## 2015-12-18 DIAGNOSIS — F17210 Nicotine dependence, cigarettes, uncomplicated: Secondary | ICD-10-CM | POA: Insufficient documentation

## 2015-12-18 DIAGNOSIS — Z79899 Other long term (current) drug therapy: Secondary | ICD-10-CM | POA: Insufficient documentation

## 2015-12-18 DIAGNOSIS — I5042 Chronic combined systolic (congestive) and diastolic (congestive) heart failure: Secondary | ICD-10-CM | POA: Insufficient documentation

## 2015-12-18 DIAGNOSIS — I160 Hypertensive urgency: Secondary | ICD-10-CM | POA: Insufficient documentation

## 2015-12-18 DIAGNOSIS — N184 Chronic kidney disease, stage 4 (severe): Secondary | ICD-10-CM | POA: Insufficient documentation

## 2015-12-18 DIAGNOSIS — F1410 Cocaine abuse, uncomplicated: Secondary | ICD-10-CM | POA: Insufficient documentation

## 2015-12-18 LAB — COMPREHENSIVE METABOLIC PANEL
ALT: 19 U/L (ref 14–54)
AST: 30 U/L (ref 15–41)
Albumin: 3.3 g/dL — ABNORMAL LOW (ref 3.5–5.0)
Alkaline Phosphatase: 87 U/L (ref 38–126)
Anion gap: 16 — ABNORMAL HIGH (ref 5–15)
BILIRUBIN TOTAL: 0.8 mg/dL (ref 0.3–1.2)
BUN: 25 mg/dL — AB (ref 6–20)
CALCIUM: 8.5 mg/dL — AB (ref 8.9–10.3)
CHLORIDE: 98 mmol/L — AB (ref 101–111)
CO2: 21 mmol/L — ABNORMAL LOW (ref 22–32)
CREATININE: 2.79 mg/dL — AB (ref 0.44–1.00)
GFR, EST AFRICAN AMERICAN: 23 mL/min — AB (ref >60)
GFR, EST NON AFRICAN AMERICAN: 20 mL/min — AB (ref >60)
Glucose, Bld: 77 mg/dL (ref 65–99)
Potassium: 3.2 mmol/L — ABNORMAL LOW (ref 3.5–5.1)
Sodium: 135 mmol/L (ref 135–145)
TOTAL PROTEIN: 6.5 g/dL (ref 6.5–8.1)

## 2015-12-18 LAB — CBC
HEMATOCRIT: 28.9 % — AB (ref 36.0–46.0)
Hemoglobin: 9.2 g/dL — ABNORMAL LOW (ref 12.0–15.0)
MCH: 27.6 pg (ref 26.0–34.0)
MCHC: 31.8 g/dL (ref 30.0–36.0)
MCV: 86.8 fL (ref 78.0–100.0)
PLATELETS: 424 10*3/uL — AB (ref 150–400)
RBC: 3.33 MIL/uL — AB (ref 3.87–5.11)
RDW: 16.5 % — ABNORMAL HIGH (ref 11.5–15.5)
WBC: 11.9 10*3/uL — ABNORMAL HIGH (ref 4.0–10.5)

## 2015-12-18 LAB — BRAIN NATRIURETIC PEPTIDE: B Natriuretic Peptide: 2737.1 pg/mL — ABNORMAL HIGH (ref 0.0–100.0)

## 2015-12-18 NOTE — ED Triage Notes (Addendum)
Pt c/o bilateral feet swelling x 2 days with sob. Hx of CHF, has been compliant with diuretics. Denies chest pain

## 2015-12-19 ENCOUNTER — Emergency Department (HOSPITAL_COMMUNITY)
Admission: EM | Admit: 2015-12-19 | Discharge: 2015-12-19 | Disposition: A | Discharge status: Home / Self Care | Payer: Self-pay | Attending: Emergency Medicine | Admitting: Emergency Medicine

## 2015-12-19 ENCOUNTER — Emergency Department (HOSPITAL_COMMUNITY): Payer: Self-pay

## 2015-12-19 DIAGNOSIS — R609 Edema, unspecified: Secondary | ICD-10-CM

## 2015-12-19 DIAGNOSIS — I160 Hypertensive urgency: Secondary | ICD-10-CM

## 2015-12-19 DIAGNOSIS — R600 Localized edema: Secondary | ICD-10-CM

## 2015-12-19 DIAGNOSIS — F1410 Cocaine abuse, uncomplicated: Secondary | ICD-10-CM

## 2015-12-19 LAB — RAPID URINE DRUG SCREEN, HOSP PERFORMED
AMPHETAMINES: NOT DETECTED
BENZODIAZEPINES: NOT DETECTED
Barbiturates: NOT DETECTED
Cocaine: POSITIVE — AB
OPIATES: NOT DETECTED
Tetrahydrocannabinol: NOT DETECTED

## 2015-12-19 MED ORDER — FUROSEMIDE 40 MG PO TABS: 80.0000 mg | ORAL_TABLET | Freq: Two times a day (BID) | ORAL | 0 refills | Status: AC

## 2015-12-19 MED ORDER — BENZONATATE 100 MG PO CAPS: 100.0000 mg | ORAL_CAPSULE | Freq: Three times a day (TID) | ORAL | 0 refills | Status: AC | PRN

## 2015-12-19 MED ORDER — FUROSEMIDE 10 MG/ML IJ SOLN
40.0000 mg | Freq: Once | INTRAMUSCULAR | Status: AC
  Administered 2015-12-19: 40 mg via INTRAVENOUS
  Filled 2015-12-19: qty 4

## 2015-12-19 MED ORDER — POTASSIUM CHLORIDE CRYS ER 20 MEQ PO TBCR
40.0000 meq | EXTENDED_RELEASE_TABLET | Freq: Once | ORAL | Status: AC
Start: 2015-12-19 — End: 2015-12-19
  Administered 2015-12-19: 40 meq via ORAL
  Filled 2015-12-19: qty 2

## 2015-12-19 MED ORDER — NIFEDIPINE ER 60 MG PO TB24
60.0000 mg | ORAL_TABLET | Freq: Once | ORAL | Status: AC
  Administered 2015-12-19: 60 mg via ORAL
  Filled 2015-12-19: qty 1

## 2015-12-19 MED ORDER — POTASSIUM CHLORIDE ER 20 MEQ PO TBCR: 20.0000 meq | EXTENDED_RELEASE_TABLET | Freq: Every day | ORAL | 0 refills | Status: AC

## 2015-12-19 MED ORDER — ISOSORBIDE MONONITRATE ER 30 MG PO TB24
120.0000 mg | ORAL_TABLET | Freq: Every day | ORAL | Status: DC
  Administered 2015-12-19: 120 mg via ORAL
  Filled 2015-12-19: qty 4

## 2015-12-19 MED ORDER — HYDRALAZINE HCL 25 MG PO TABS
100.0000 mg | ORAL_TABLET | Freq: Once | ORAL | Status: AC
  Administered 2015-12-19: 100 mg via ORAL
  Filled 2015-12-19: qty 4

## 2015-12-19 MED ORDER — HYDRALAZINE HCL 20 MG/ML IJ SOLN
10.0000 mg | Freq: Once | INTRAMUSCULAR | Status: AC
  Administered 2015-12-19: 10 mg via INTRAVENOUS
  Filled 2015-12-19: qty 1

## 2015-12-19 NOTE — ED Provider Notes (Addendum)
[facility]-EMERGENCY DEPT Provider Note   CSN: [id number] Arrival date & time: 12/18/15  2223  By signing my name below, I, Clovis Pu, attest that this documentation has been prepared under the direction and in the presence of Shon Baton, MD  Electronically Signed: Clovis Pu, ED Scribe. 12/19/15. 12:29 AM.  History   Chief Complaint Chief Complaint  Patient presents with  . Leg Swelling    The history is provided by the patient. No language interpreter was used.   HPI Comments:  April [name] is a 40 y.o. female, with a hx of HTN, COPD, and CHF, who presents to the Emergency Department complaining of sudden onset, moderate bilateral foot swelling x 2 days. Pt also endorses SOB, a cough and headache. Her SOB is worse when laying flat. She has been taking a diuretic ( 2 pills) with no relief. She also state she is on multiple blood pressure medications which she takes in the AM. Pt denies chest pain, abdominal pain, nausea, vomiting, any other associated symptoms and modifying factors at this time. Pt is followed by Dr. Jaclyn Shaggy for her HTN. Patient has a history of cocaine abuse. Last used yesterday.   Past Medical History:  Diagnosis Date  . Anemia   . Asthma   . Cellulitis of right lower extremity 08/29/2015  . Chronic combined systolic and diastolic CHF (congestive heart failure) (HCC)    a. 12/2014 Echo: EF 45-50%, Gr2 DD, mild MR, mod dil LA, mod TR, PASP ;  b. 10/2015 Echo: EF 45%, diff HK, mild MR, sev dil LA, mild TR.  Marland Kitchen Chronic kidney disease (CKD), stage IV (severe) (HCC)   . COPD (chronic obstructive pulmonary disease) (HCC)   . Depression   . History of migraine headaches   . Hypertensive heart disease with heart failure (HCC)   . On home oxygen therapy    "when needed" (08/29/2015)  . OSA (obstructive sleep apnea)    a. CPAP noncompliant.  . Tobacco abuse     Patient Active Problem List   Diagnosis Date Noted  . Substance abuse  11/06/2015  . Hypertensive heart disease with heart failure (HCC)   . Tobacco abuse   . OSA (obstructive sleep apnea)   . Chronic kidney disease (CKD), stage IV (severe) (HCC)   . Chronic combined systolic and diastolic CHF (congestive heart failure) (HCC)   . Anxiety and depression 10/24/2015  . Hypertensive heart disease 10/20/2015  . CHF exacerbation (HCC) 10/19/2015  . Acute on chronic diastolic HF (heart failure) (HCC) 10/19/2015  . CHF (congestive heart failure) (HCC) 10/19/2015  . COPD (chronic obstructive pulmonary disease) (HCC) 10/16/2015  . Acute exacerbation of CHF (congestive heart failure) (HCC) 10/16/2015  . Iron deficiency anemia   . Chronic diastolic congestive heart failure (HCC)   . Cellulitis of right lower extremity 08/29/2015  . Vitamin D deficiency 01/11/2015  . Fatigue 01/10/2015  . Dyspnea 01/10/2015  . Migraine 10/17/2014  . Chronic kidney disease 10/17/2014  . Asthma 09/04/2014  . Essential hypertension 09/03/2014  . Congestive heart failure (HCC) 09/03/2014    Past Surgical History:  Procedure Laterality Date  . I&D EXTREMITY Right 09/03/2015   Procedure: IRRIGATION AND DEBRIDEMENT abcess LOWER LEG with complex closure;  Surgeon: Sheral Apley, MD;  Location: [facility] OR;  Service: Orthopedics;  Laterality: Right;  . NO PAST SURGERIES      OB History    No data available       Home Medications    Prior  to Admission medications   Medication Sig Start Date End Date Taking? Authorizing Provider  albuterol (PROVENTIL HFA;VENTOLIN HFA) 108 (90 BASE) MCG/ACT inhaler Inhale 2 puffs into the lungs every 4 (four) hours as needed for wheezing or shortness of breath. 10/16/14   Kristen N Ward, DO  albuterol (PROVENTIL) (2.5 MG/3ML) 0.083% nebulizer solution Take 3 mLs (2.5 mg total) by nebulization every 6 (six) hours as needed for wheezing or shortness of breath. 11/07/15   Jaclyn Shaggy, MD  beclomethasone (QVAR) 80 MCG/ACT inhaler Inhale 2 puffs into the  lungs 2 (two) times daily. 10/24/15   Jaclyn Shaggy, MD  butalbital-acetaminophen-caffeine (FIORICET, ESGIC) 50-325-40 MG tablet Take 1 tablet by mouth every 4 (four) hours as needed for headache. 10/22/15   Richarda Overlie, MD  carvedilol (COREG) 25 MG tablet Take 1 tablet (25 mg total) by mouth 2 (two) times daily with a meal. 11/07/15   Jaclyn Shaggy, MD  diphenhydramine-acetaminophen (TYLENOL PM) 25-500 MG TABS tablet Take 1-2 tablets by mouth at bedtime as needed (for sleep).    Historical Provider, MD  ferrous sulfate 325 (65 FE) MG tablet Take 1 tablet (325 mg total) by mouth daily. 08/29/15   Gilda Crease, MD  FLUoxetine (PROZAC) 20 MG tablet Take 1 tablet (20 mg total) by mouth daily. 10/24/15   Jaclyn Shaggy, MD  furosemide (LASIX) 40 MG tablet Take 2 tablets (80 mg total) by mouth 2 (two) times daily. 12/19/15   Shon Baton, MD  hydrALAZINE (APRESOLINE) 100 MG tablet Take 1 tablet (100 mg total) by mouth every 8 (eight) hours. 10/22/15   Richarda Overlie, MD  HYDROcodone-homatropine (HYCODAN) 5-1.5 MG/5ML syrup Take 5 mLs by mouth every 6 (six) hours as needed for cough. 12/03/15   Gilda Crease, MD  hydrOXYzine (ATARAX/VISTARIL) 25 MG tablet Take 1 tablet (25 mg total) by mouth 3 (three) times daily as needed. 10/24/15   Jaclyn Shaggy, MD  isosorbide mononitrate (IMDUR) 120 MG 24 hr tablet Take 1 tablet (120 mg total) by mouth daily. 11/07/15   Jaclyn Shaggy, MD  NIFEdipine (PROCARDIA XL) 60 MG 24 hr tablet Take 1 tablet (60 mg total) by mouth 2 (two) times daily. 10/24/15   Jaclyn Shaggy, MD  oxymetazoline (AFRIN NASAL SPRAY) 0.05 % nasal spray Place 1 spray into right nostril 2 (two) times daily as needed for congestion. 04/05/15   Audry Pili, PA-C  potassium chloride 20 MEQ TBCR Take 20 mEq by mouth daily. 12/19/15   Shon Baton, MD  predniSONE (DELTASONE) 20 MG tablet Take 3 tablets (60 mg total) by mouth daily with breakfast. 12/03/15   Gilda Crease, MD    topiramate (TOPAMAX) 50 MG tablet Take 1 tablet (50 mg total) by mouth 2 (two) times daily. 10/24/15   Jaclyn Shaggy, MD    Family History Family History  Problem Relation Age of Onset  . Diabetes Mother   . Hypertension Mother   . Cancer Father   . Diabetes Maternal Grandmother   . Cancer Maternal Grandfather   . Cancer Paternal Grandfather     Social History Social History  Substance Use Topics  . Smoking status: Current Every Day Smoker    Packs/day: 0.60    Years: 20.00    Types: Cigarettes  . Smokeless tobacco: Never Used     Comment: 4 cigs daily  . Alcohol use Yes     Comment: last time one month ago     Allergies   Patient has no known allergies.  Review of Systems Review of Systems  Constitutional: Negative for fever.  Respiratory: Positive for cough and shortness of breath.   Cardiovascular: Positive for leg swelling. Negative for chest pain.  Gastrointestinal: Negative for abdominal pain, nausea and vomiting.  Neurological: Positive for headaches.  All other systems reviewed and are negative.  Physical Exam Updated Vital Signs BP 183/100   Pulse 105   Temp 98 F (36.7 C) (Oral)   Resp 20   Ht 5\' 4"  (1.626 m)   Wt 169 lb (76.7 kg)   LMP 12/16/2015   SpO2 100%   BMI 29.01 kg/m   Physical Exam  Constitutional: She is oriented to person, place, and time.  Overweight, anxious appearing  HENT:  Head: Normocephalic and atraumatic.  Eyes: Pupils are equal, round, and reactive to light.  Cardiovascular: Normal rate, regular rhythm and normal heart sounds.   No murmur heard. Pulmonary/Chest: Effort normal and breath sounds normal. No respiratory distress. She has no wheezes.  Abdominal: Soft. Bowel sounds are normal. There is no tenderness. There is no guarding.  Musculoskeletal: She exhibits edema.  2+ bilateral lower extremity edema  Neurological: She is alert and oriented to person, place, and time.  Skin: Skin is warm and dry.  Psychiatric:  She has a normal mood and affect.  Nursing note and vitals reviewed.    ED Treatments / Results  DIAGNOSTIC STUDIES:  Oxygen Saturation is 98% on RA, normal by my interpretation.    COORDINATION OF CARE:  12:19 AM Discussed treatment plan with pt at bedside and pt agreed to plan.  Labs (all labs ordered are listed, but only abnormal results are displayed) Labs Reviewed  BRAIN NATRIURETIC PEPTIDE - Abnormal; Notable for the following:       Result Value   B Natriuretic Peptide 2,737.1 (*)    All other components within normal limits  CBC - Abnormal; Notable for the following:    WBC 11.9 (*)    RBC 3.33 (*)    Hemoglobin 9.2 (*)    HCT 28.9 (*)    RDW 16.5 (*)    Platelets 424 (*)    All other components within normal limits  COMPREHENSIVE METABOLIC PANEL - Abnormal; Notable for the following:    Potassium 3.2 (*)    Chloride 98 (*)    CO2 21 (*)    BUN 25 (*)    Creatinine, Ser 2.79 (*)    Calcium 8.5 (*)    Albumin 3.3 (*)    GFR calc non Af Amer 20 (*)    GFR calc Af Amer 23 (*)    Anion gap 16 (*)    All other components within normal limits  RAPID URINE DRUG SCREEN, HOSP PERFORMED - Abnormal; Notable for the following:    Cocaine POSITIVE (*)    All other components within normal limits    EKG  EKG Interpretation  Date/Time:  Thursday December 19 2015 02:44:54 EST Ventricular Rate:  98 PR Interval:    QRS Duration: 85 QT Interval:  410 QTC Calculation: 524 R Axis:   66 Text Interpretation:  Sinus rhythm Biatrial enlargement Probable LVH with secondary repol abnrm Prolonged QT interval No significant change since last tracing Confirmed by Shravan Salahuddin  MD, Toni AmendOURTNEY ([id number]) on 12/19/2015 2:50:16 AM       Radiology Dg Chest 2 View  Result Date: 12/19/2015 CLINICAL DATA:  40 year old female with cough and congestion and shortness of breath. EXAM: CHEST  2 VIEW COMPARISON:  Chest radiograph dated  10/20/2015 FINDINGS: There is moderate cardiomegaly. Mild  vascular prominence compatible with congestive changes. No pulmonary edema. There is no focal consolidation, pleural effusion, or pneumothorax. No acute osseous pathology identified. IMPRESSION: Cardiomegaly with mild congestive changes. No focal consolidation or edema. Electronically Signed   By: Elgie CollardArash  Radparvar M.D.   On: 12/19/2015 01:17    Procedures Procedures (including critical care time)  Medications Ordered in ED Medications  isosorbide mononitrate (IMDUR) 24 hr tablet 120 mg (120 mg Oral Given 12/19/15 0154)  hydrALAZINE (APRESOLINE) injection 10 mg (10 mg Intravenous Given 12/19/15 0025)  furosemide (LASIX) injection 40 mg (40 mg Intravenous Given 12/19/15 0027)  NIFEdipine (PROCARDIA-XL/ADALAT CC) 24 hr tablet 60 mg (60 mg Oral Given 12/19/15 0147)  hydrALAZINE (APRESOLINE) tablet 100 mg (100 mg Oral Given 12/19/15 0146)  potassium chloride SA (K-DUR,KLOR-CON) CR tablet 40 mEq (40 mEq Oral Given 12/19/15 0227)     Initial Impression / Assessment and Plan / ED Course  I have reviewed the triage vital signs and the nursing notes.  Pertinent labs & imaging results that were available during my care of the patient were reviewed by me and considered in my medical decision making (see chart for details).  Clinical Course     Patient presents with leg swelling, shortness of breath. Severely hypertensive 200 over 100s. History of the same and reports "I'm always this way." She continues to use cocaine. She is otherwise nontoxic. She does appear volume overloaded. Chest x-ray is clear however and she is not hypoxic. She was given 40 of IV Lasix as well as potassium. She was given 1 dose of IV hydralazine as well as her morning blood pressure medications. Repeat blood pressure 179/94. Discussed with patient did feel her hypertensive urgency is likely related to her cocaine use. I have encouraged her to discontinue use. She reports that she needs help. I have offered her help. Given her  hypertensive urgency, she was offered admission. However, patient states that she wants to go home and needs to take her husband the car. She understands the risks and benefits. Will increase Lasix to 80 mg twice a day for 3 days. Potassium supplementation provided. I have also provided her with resources for outpatient drug treatment.  After history, exam, and medical workup I feel the patient has been appropriately medically screened and is safe for discharge home. Pertinent diagnoses were discussed with the patient. Patient was given return precautions.   Final Clinical Impressions(s) / ED Diagnoses   Final diagnoses:  Peripheral edema  Hypertensive urgency  Cocaine abuse    New Prescriptions New Prescriptions   POTASSIUM CHLORIDE 20 MEQ TBCR    Take 20 mEq by mouth daily.   I personally performed the services described in this documentation, which was scribed in my presence. The recorded information has been reviewed and is accurate.     Shon Batonourtney F Artasia Thang, MD 12/19/15 [phone]    Shon Batonourtney F Tyreik Delahoussaye, MD 12/19/15 [phone]

## 2015-12-19 NOTE — ED Notes (Signed)
Pt returned from X-ray.  

## 2015-12-19 NOTE — ED Notes (Signed)
Pt is on several blood pressure medications, states blood pressure "is always this high."

## 2015-12-19 NOTE — Discharge Instructions (Signed)
You were seen today for very high blood pressure and lower extremity swelling. You were giving her her medications and her blood pressure improved. We will increase her Lasix to 80 mg twice for the next 3 days. They're to return to 40 mg twice daily.  It is very important that you stop using cocaine. This is likely the cause of your symptoms. If you develop headache, chest pain, any worsening symptoms she needs to be reevaluated immediately.

## 2015-12-19 NOTE — ED Notes (Signed)
Patient transported to X-ray 

## 2016-12-20 IMAGING — DX DG CHEST 2V
2 series · 2 of 2 positions shown · non-contrast
Comparison: None.

CLINICAL DATA: Fever, nonproductive cough, chest pain for 2 days.
History of hypertension renal disorder.

EXAM:
CHEST  2 VIEW

[chest pa]
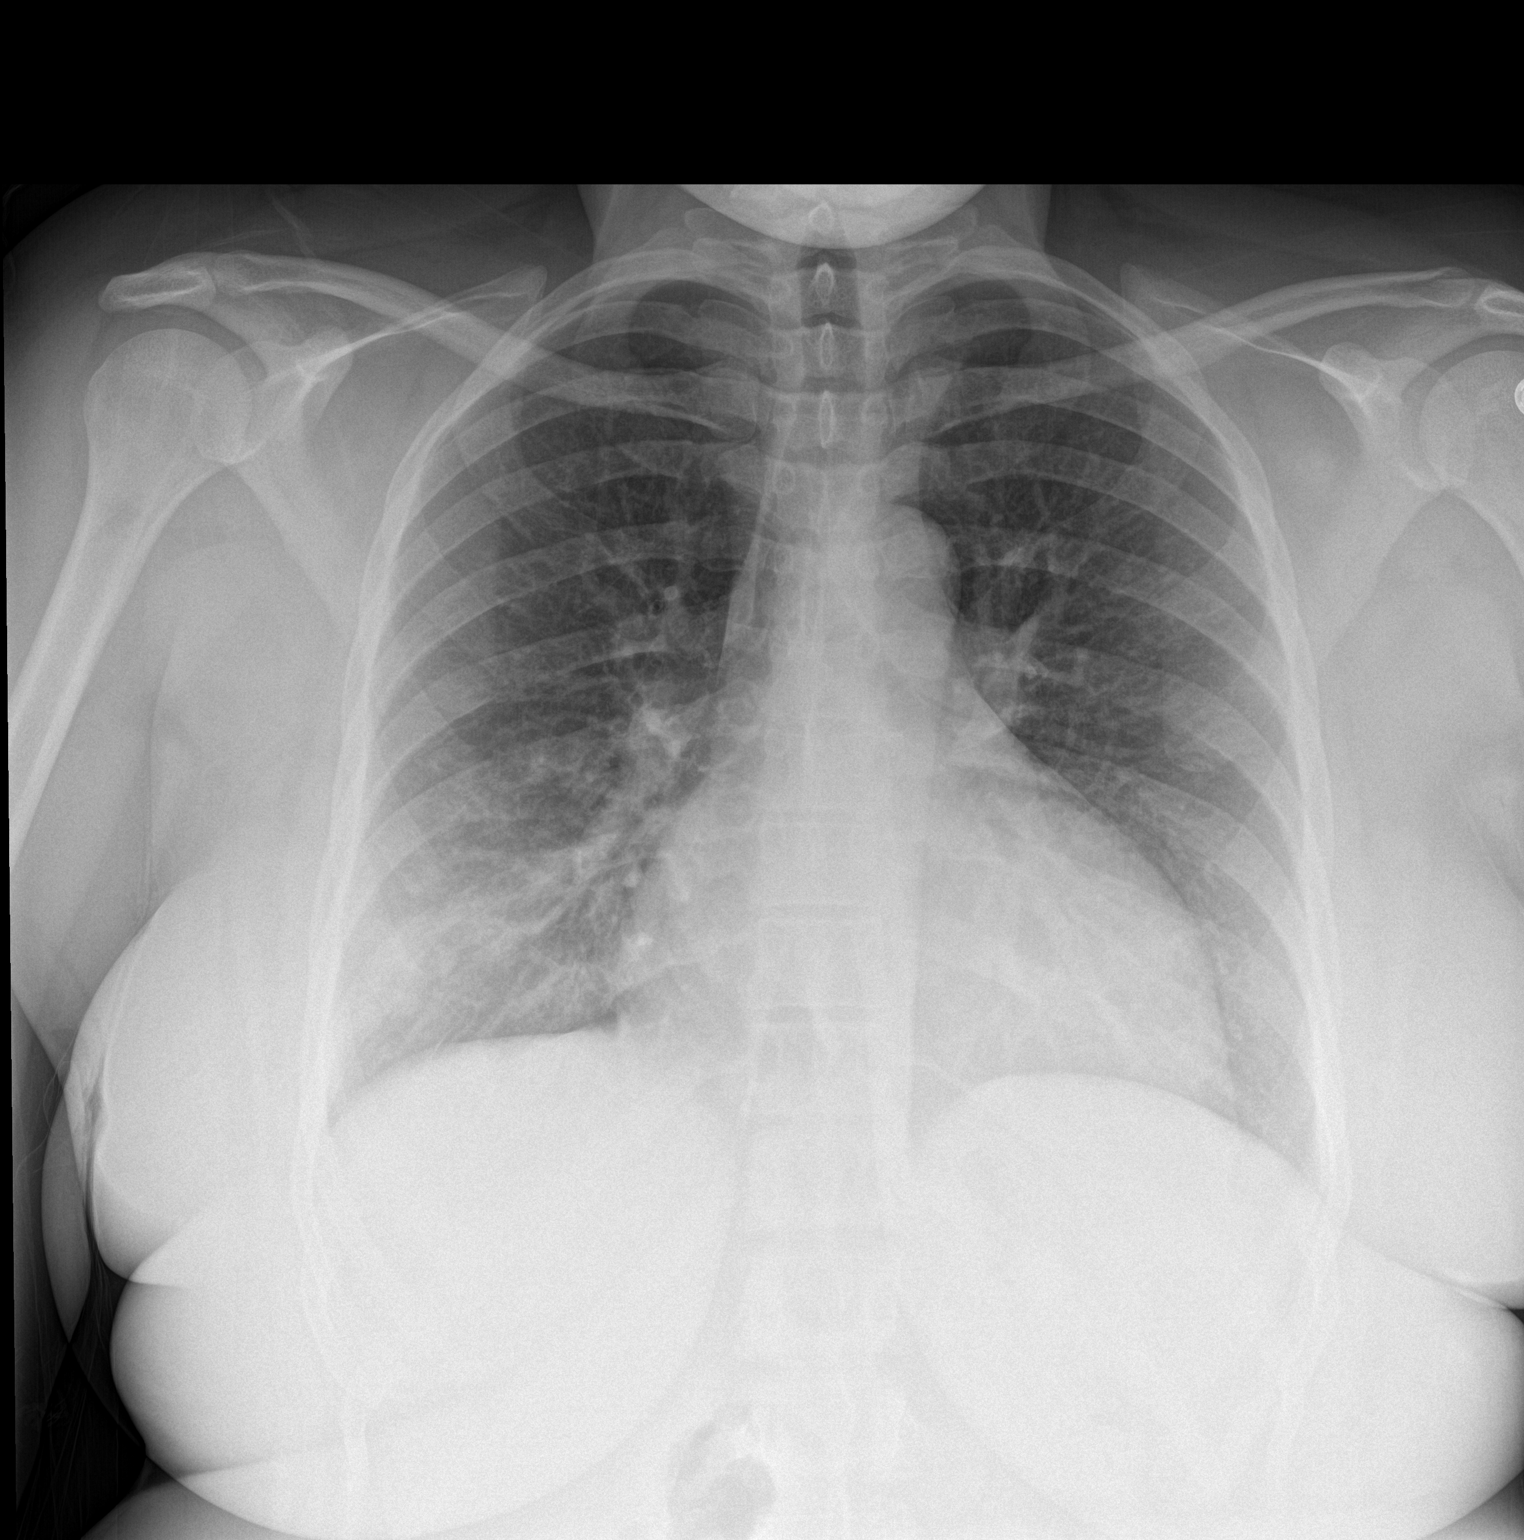

[chest lat]
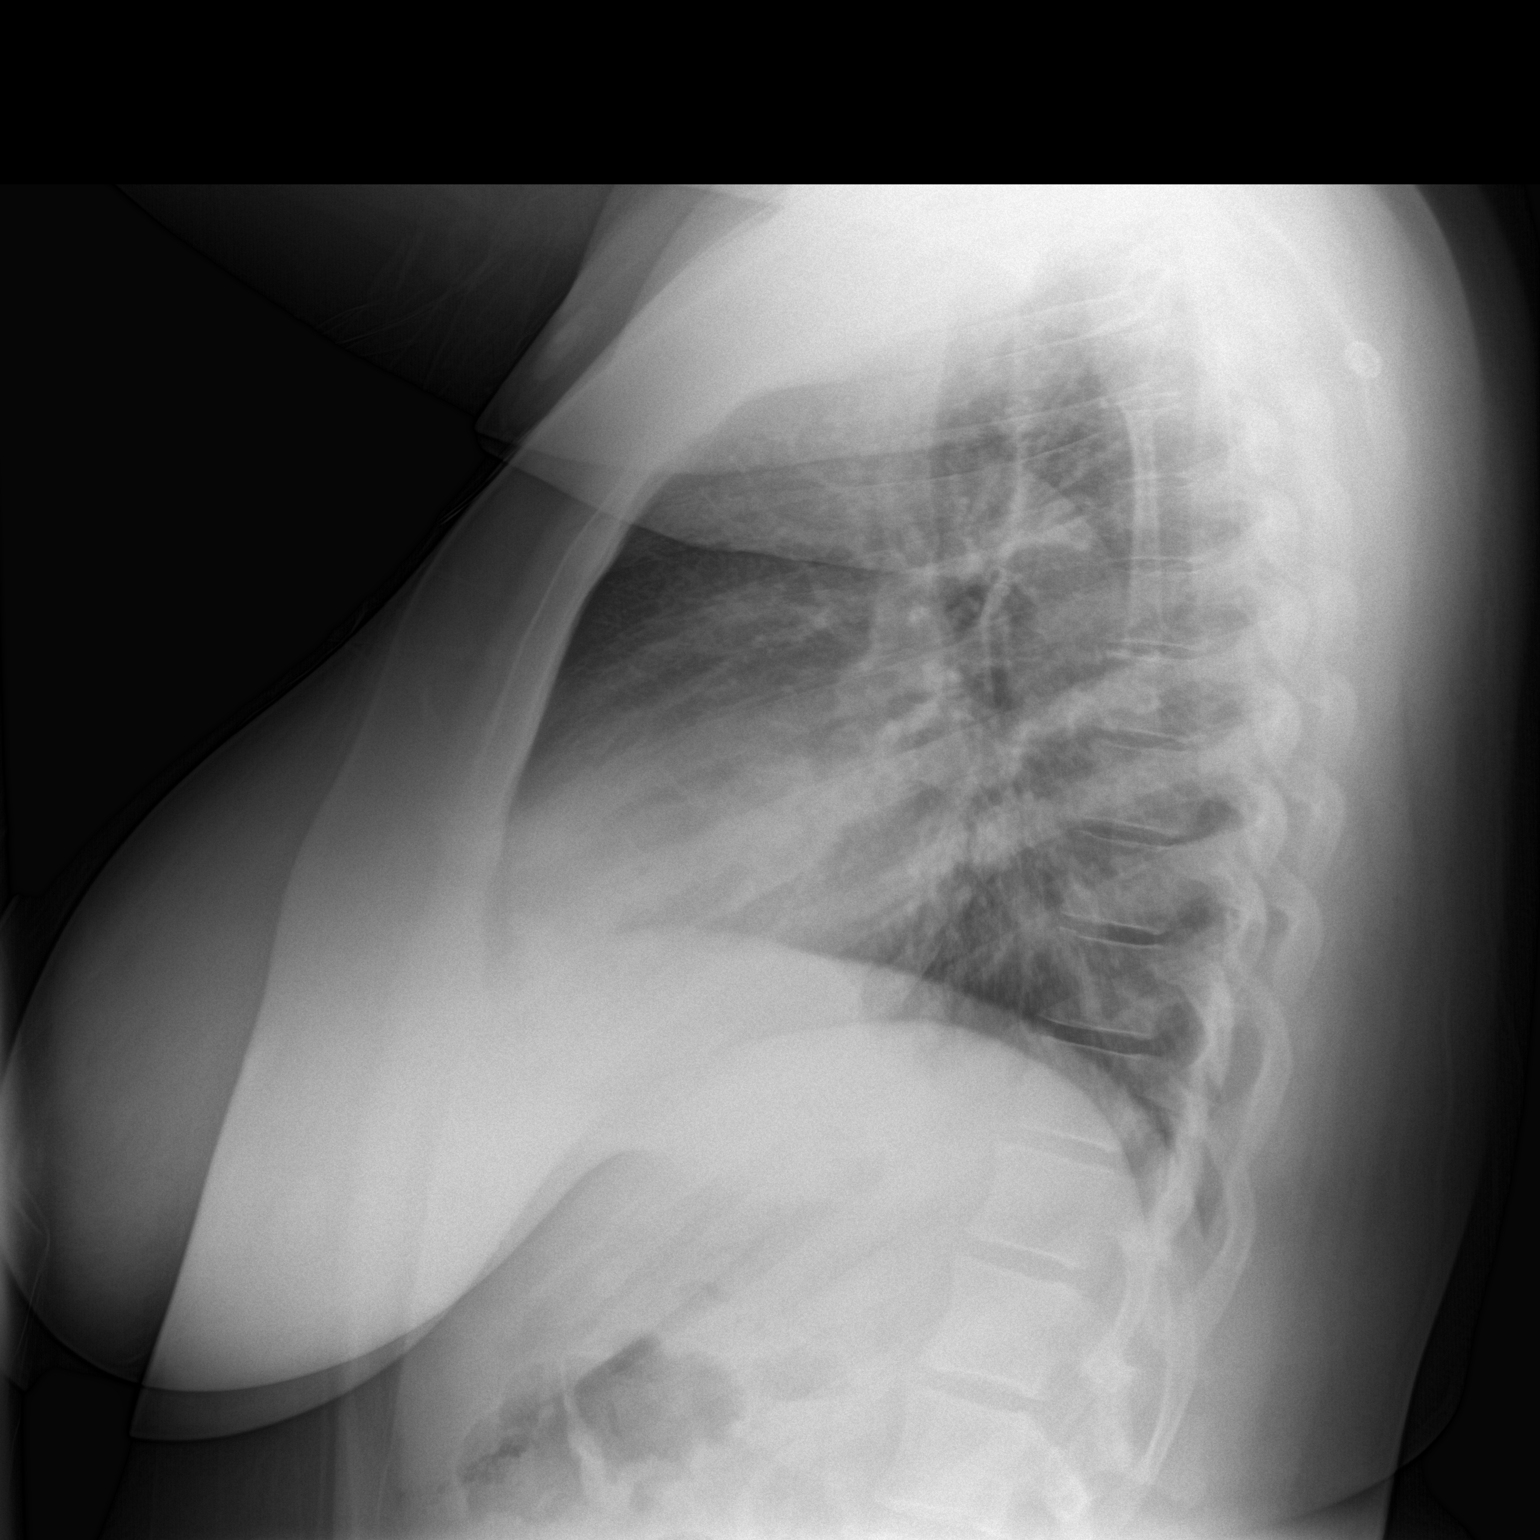

[2 of 2 positions shown; findings below may reference images not displayed]

FINDINGS: The cardiac silhouette is upper limits of normal in size,
mediastinal silhouette is nonsuspicious. Patchy RIGHT middle lobe
airspace opacity without pleural effusion. No pneumothorax. Soft
tissue planes and included osseous structures are nonsuspicious.
IMPRESSION: RIGHT middle lobe pneumonia.

Mild cardiomegaly.

By: Klpigbb Moolman

## 2018-03-03 IMAGING — US US RENAL
1 series · 14 of 25 positions shown · non-contrast
Comparison: None in PACs

CLINICAL DATA: The partially distended urinary bladder

EXAM:
RENAL / URINARY TRACT ULTRASOUND COMPLETE

[Series 1: us renal · 0.24mm/px · 14 of 28 slices shown]
[im 1/28]
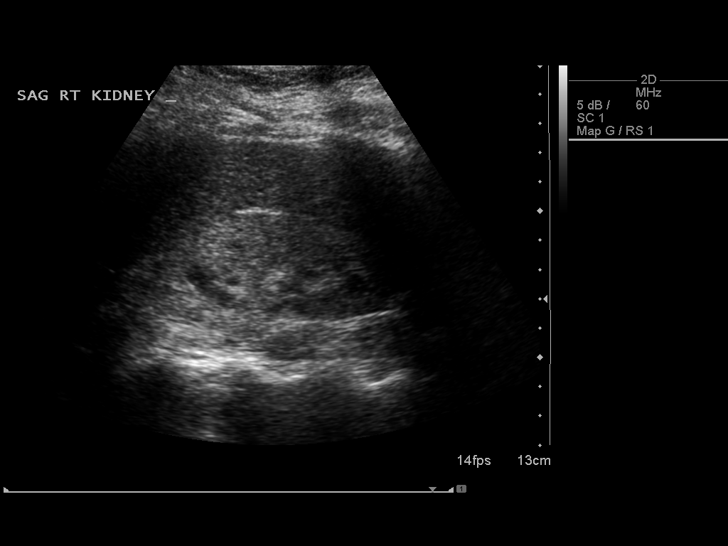
[im 3/28]
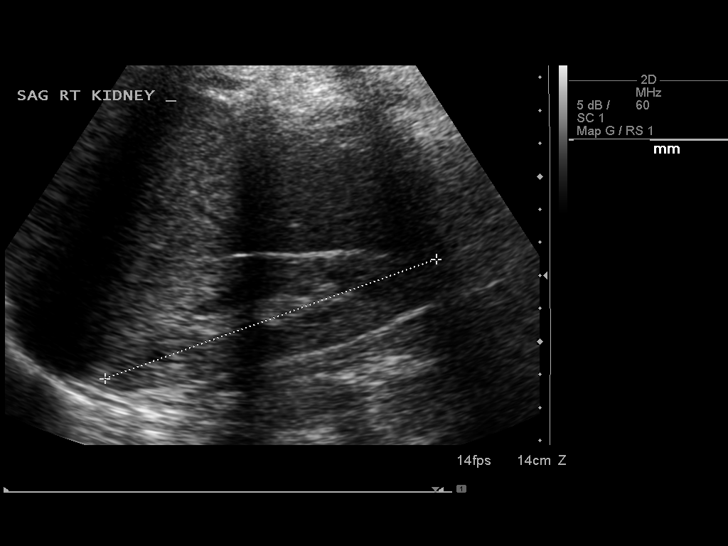
[im 5/28]
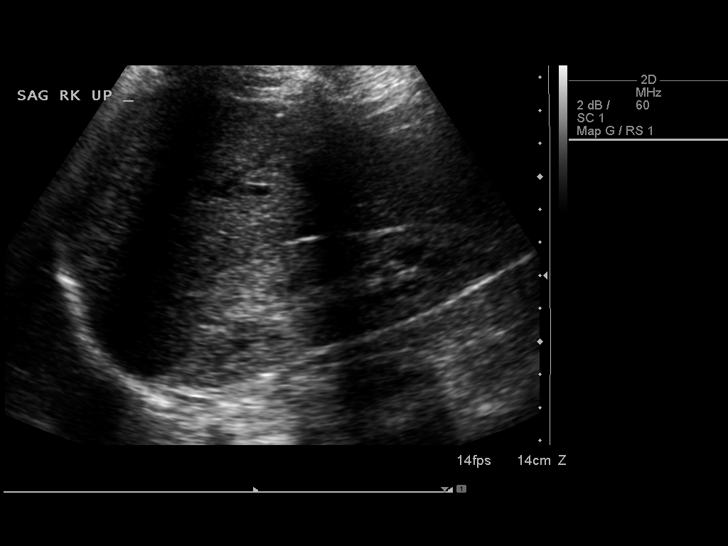
[im 7/28]
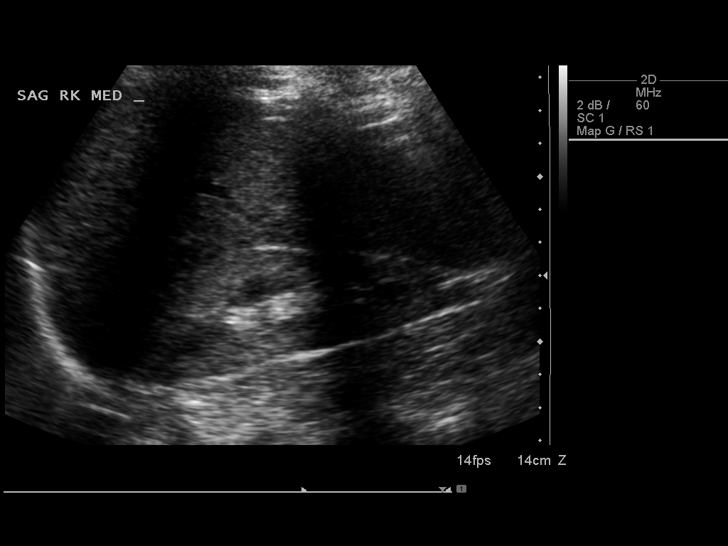
[im 10/28]
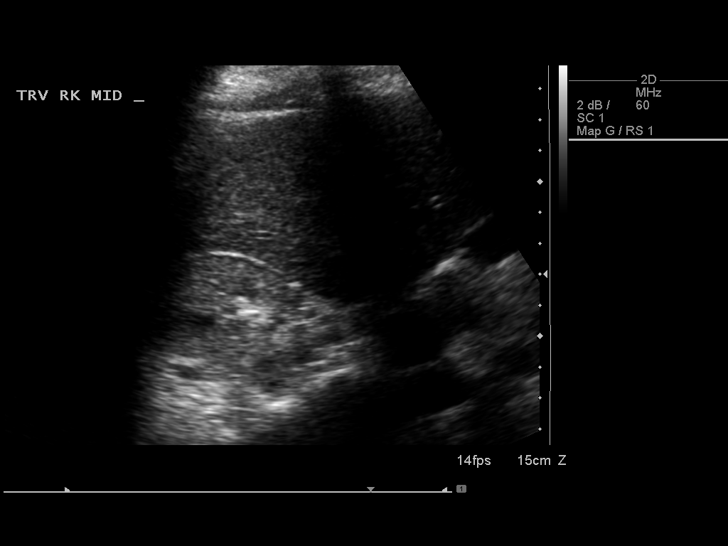
[im 11/28]
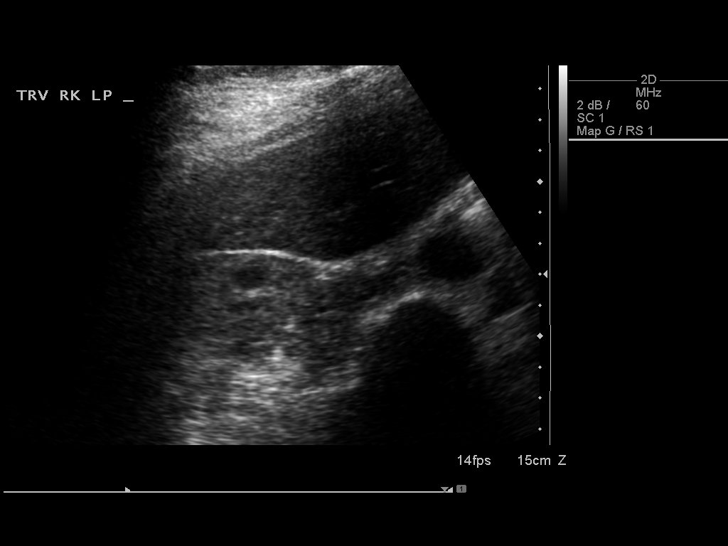
[im 13/28]
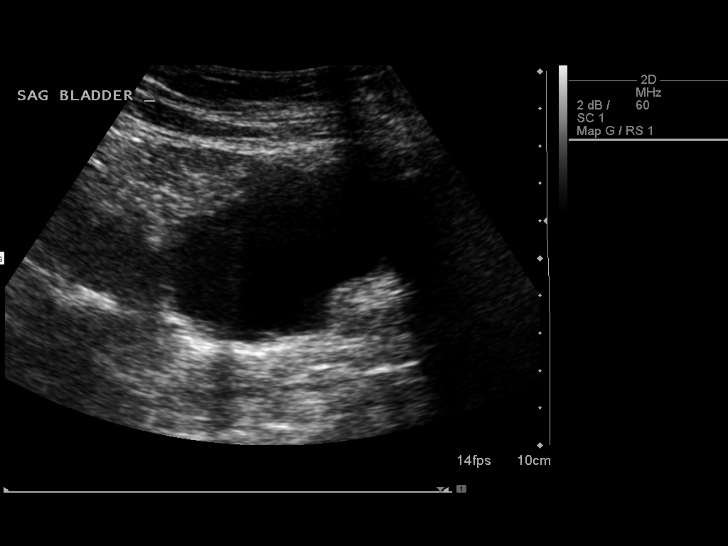
[im 15/28]
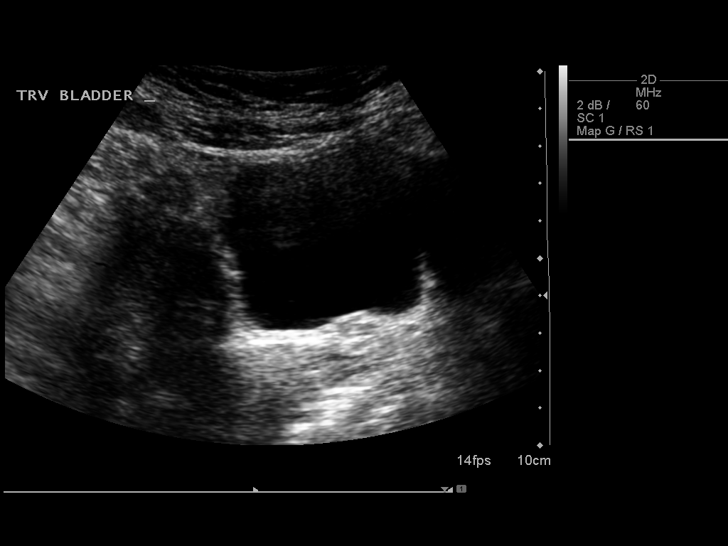
[im 17/28]
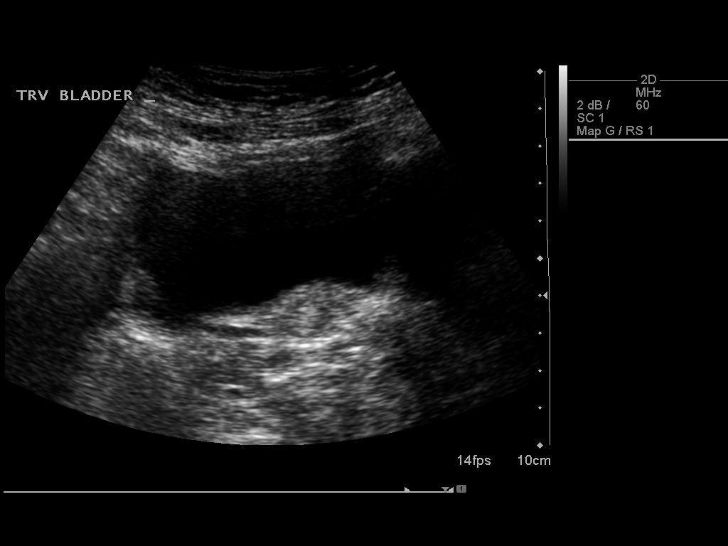
[im 19/28]
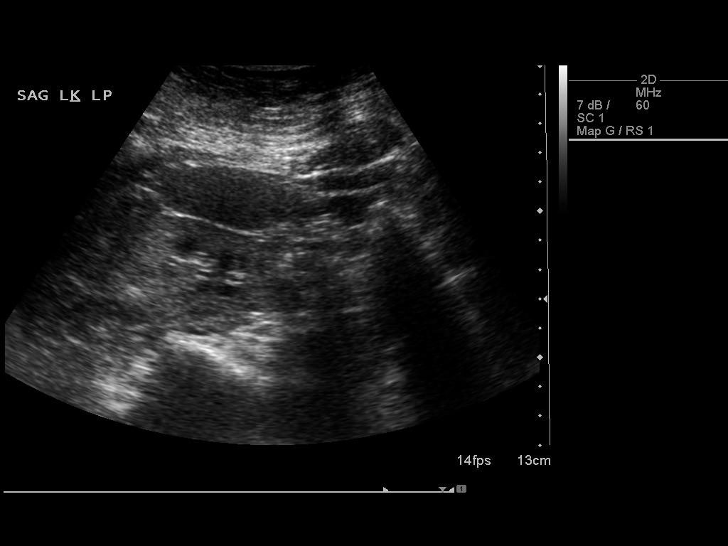
[im 21/28]
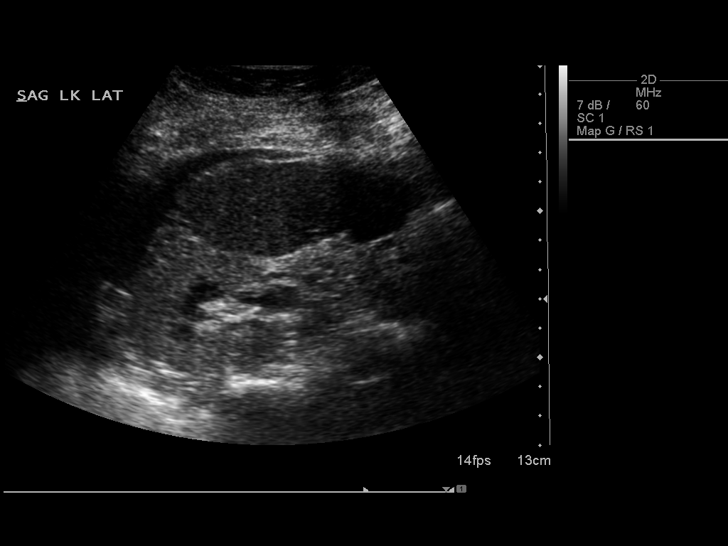
[im 23/28]
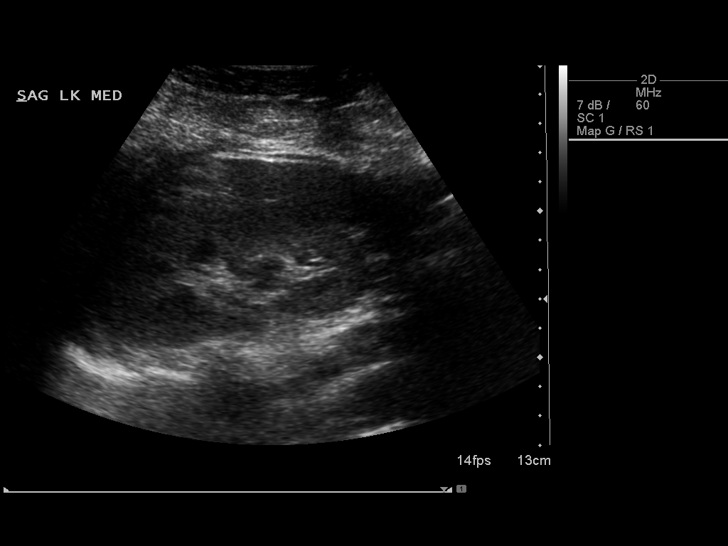
[im 25/28]
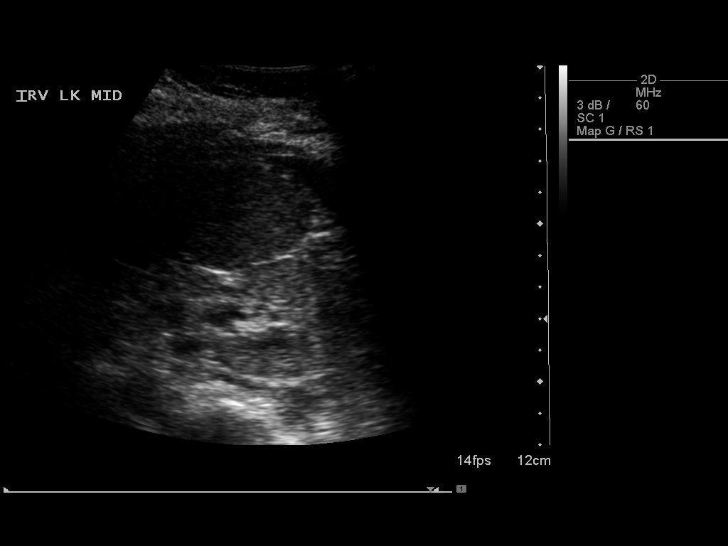
[im 28/28]
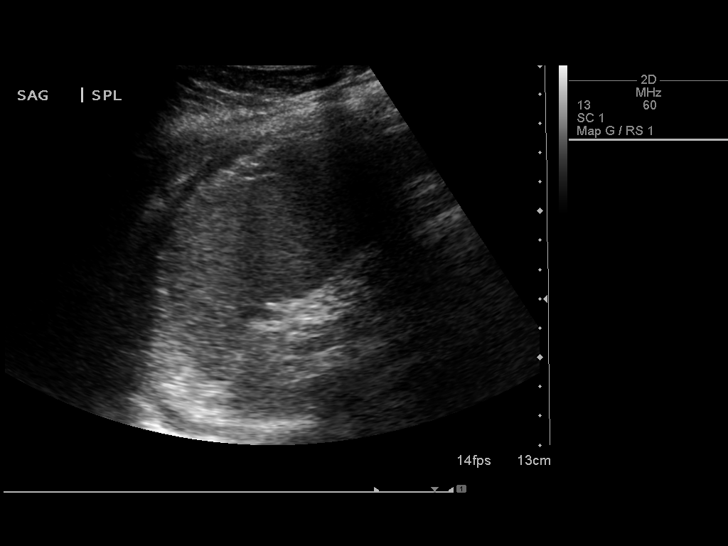

[14 of 25 positions shown; findings below may reference images not displayed]

FINDINGS: Right Kidney:

Length: 10.7 cm. The renal cortical echotexture exceeds that of the
adjacent liver. There is no focal mass or hydronephrosis.

Left Kidney:

Length: 10 cm. The renal cortical echotexture of the left kidney is
similar to that of the right. There is no mass nor hydronephrosis.

Bladder:

Appears normal for degree of bladder distention. On images 13 and 14
apparent mass defect along the inferior aspect of the urinary
bladder is not a true mass following discussion with the ultrasound
technologist.

There is a left pleural effusion.
IMPRESSION: Increased renal cortical echotexture consistent with medical renal
disease. No hydronephrosis.

## 2018-05-12 IMAGING — DX DG CHEST 1V PORT
1 series · 1 of 1 positions shown · non-contrast
Comparison: 02/14/2015

CLINICAL DATA: Asthma with shortness of breath, wheezing, and chest
tightness.

EXAM:
PORTABLE CHEST 1 VIEW

[chest ap]
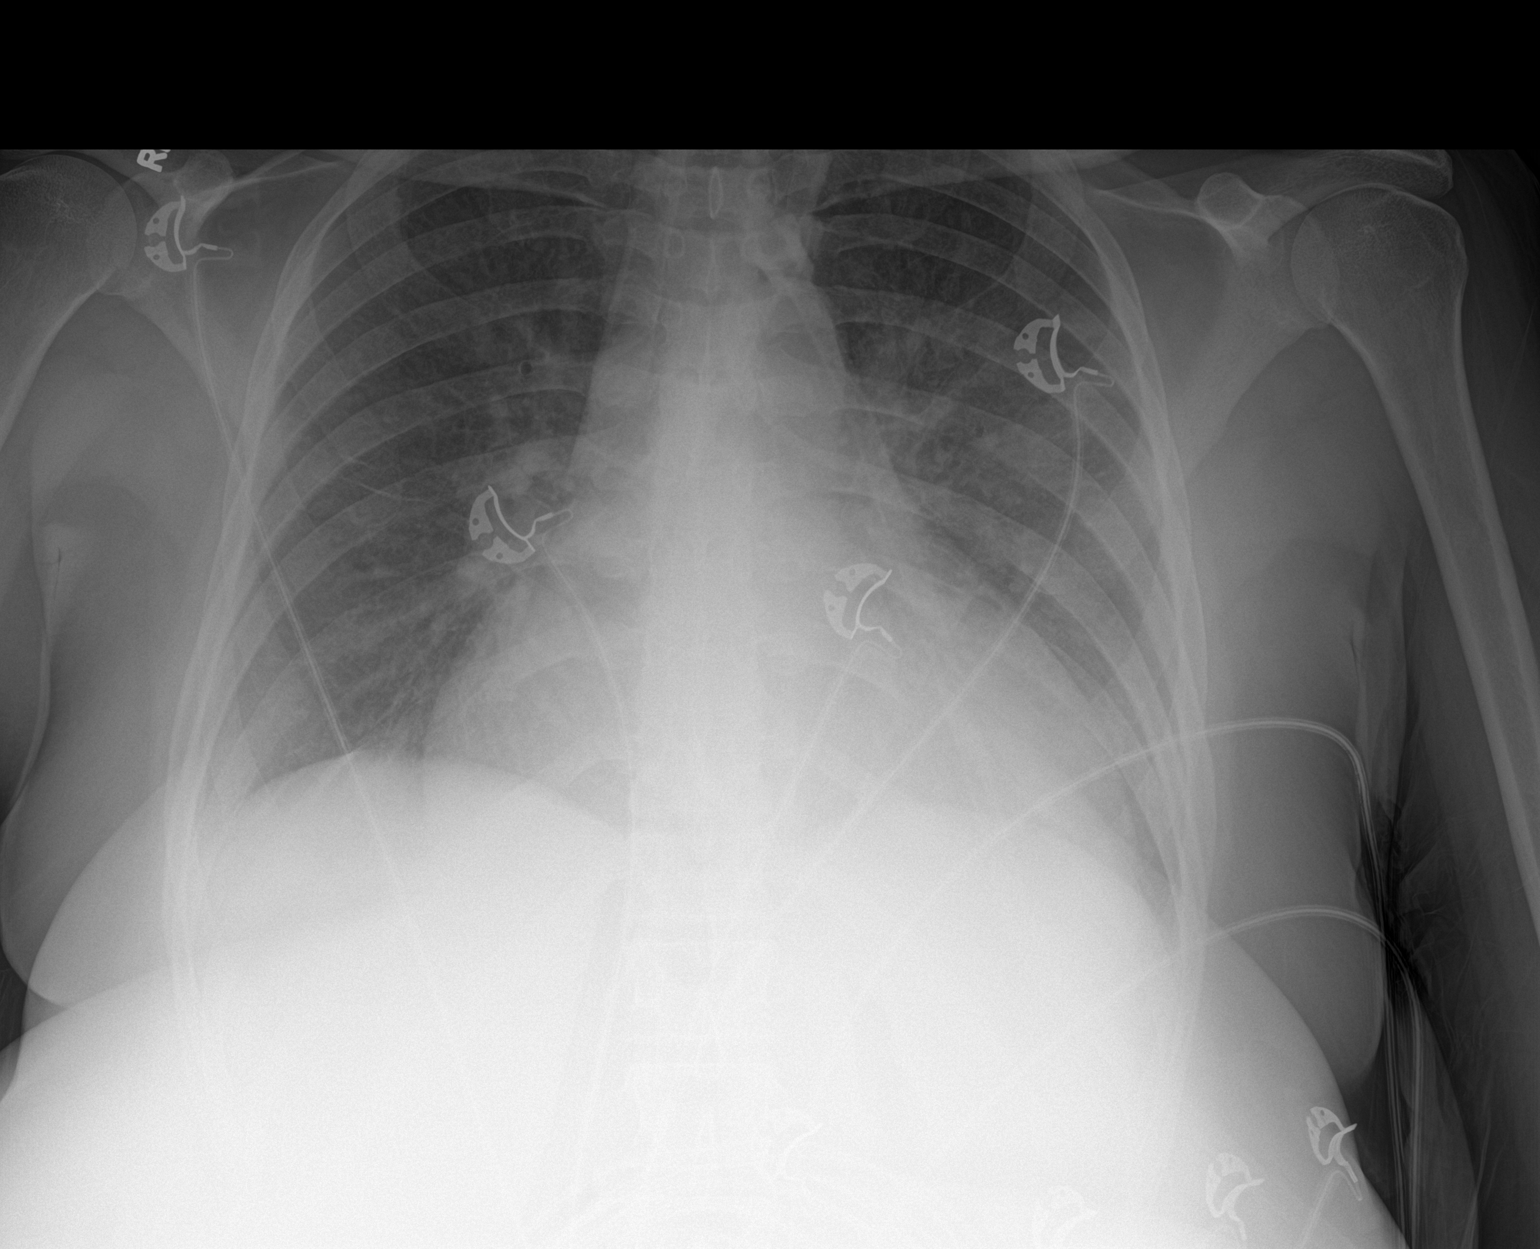

[1 of 1 positions shown; findings below may reference images not displayed]

FINDINGS: AP semi upright chest obtained [id number] hours. The cardio pericardial
silhouette is enlarged. Pulmonary vascular congestion with bilateral
hazy open opacity over the lungs. Edema is a concern. Contribution
to appearance of overlying soft tissue is suspected. The visualized
bony structures of the thorax are intact. Telemetry leads overlie
the chest.
IMPRESSION: Cardiopericardial silhouette is enlarged. Pericardial effusion not
excluded.

Vascular congestion with probable interstitial pulmonary edema.

Dedicated upright PA and lateral chest x-ray would probably prove
helpful to better characterize, when the patient is able.

## 2018-05-12 IMAGING — CR DG CHEST 2V
2 series · 2 of 2 positions shown · non-contrast
Comparison: 10/16/2015

CLINICAL DATA: Cough and shortness of breath for 2 days. History of
asthma and CHF

EXAM:
CHEST  2 VIEW

[w chest pa]
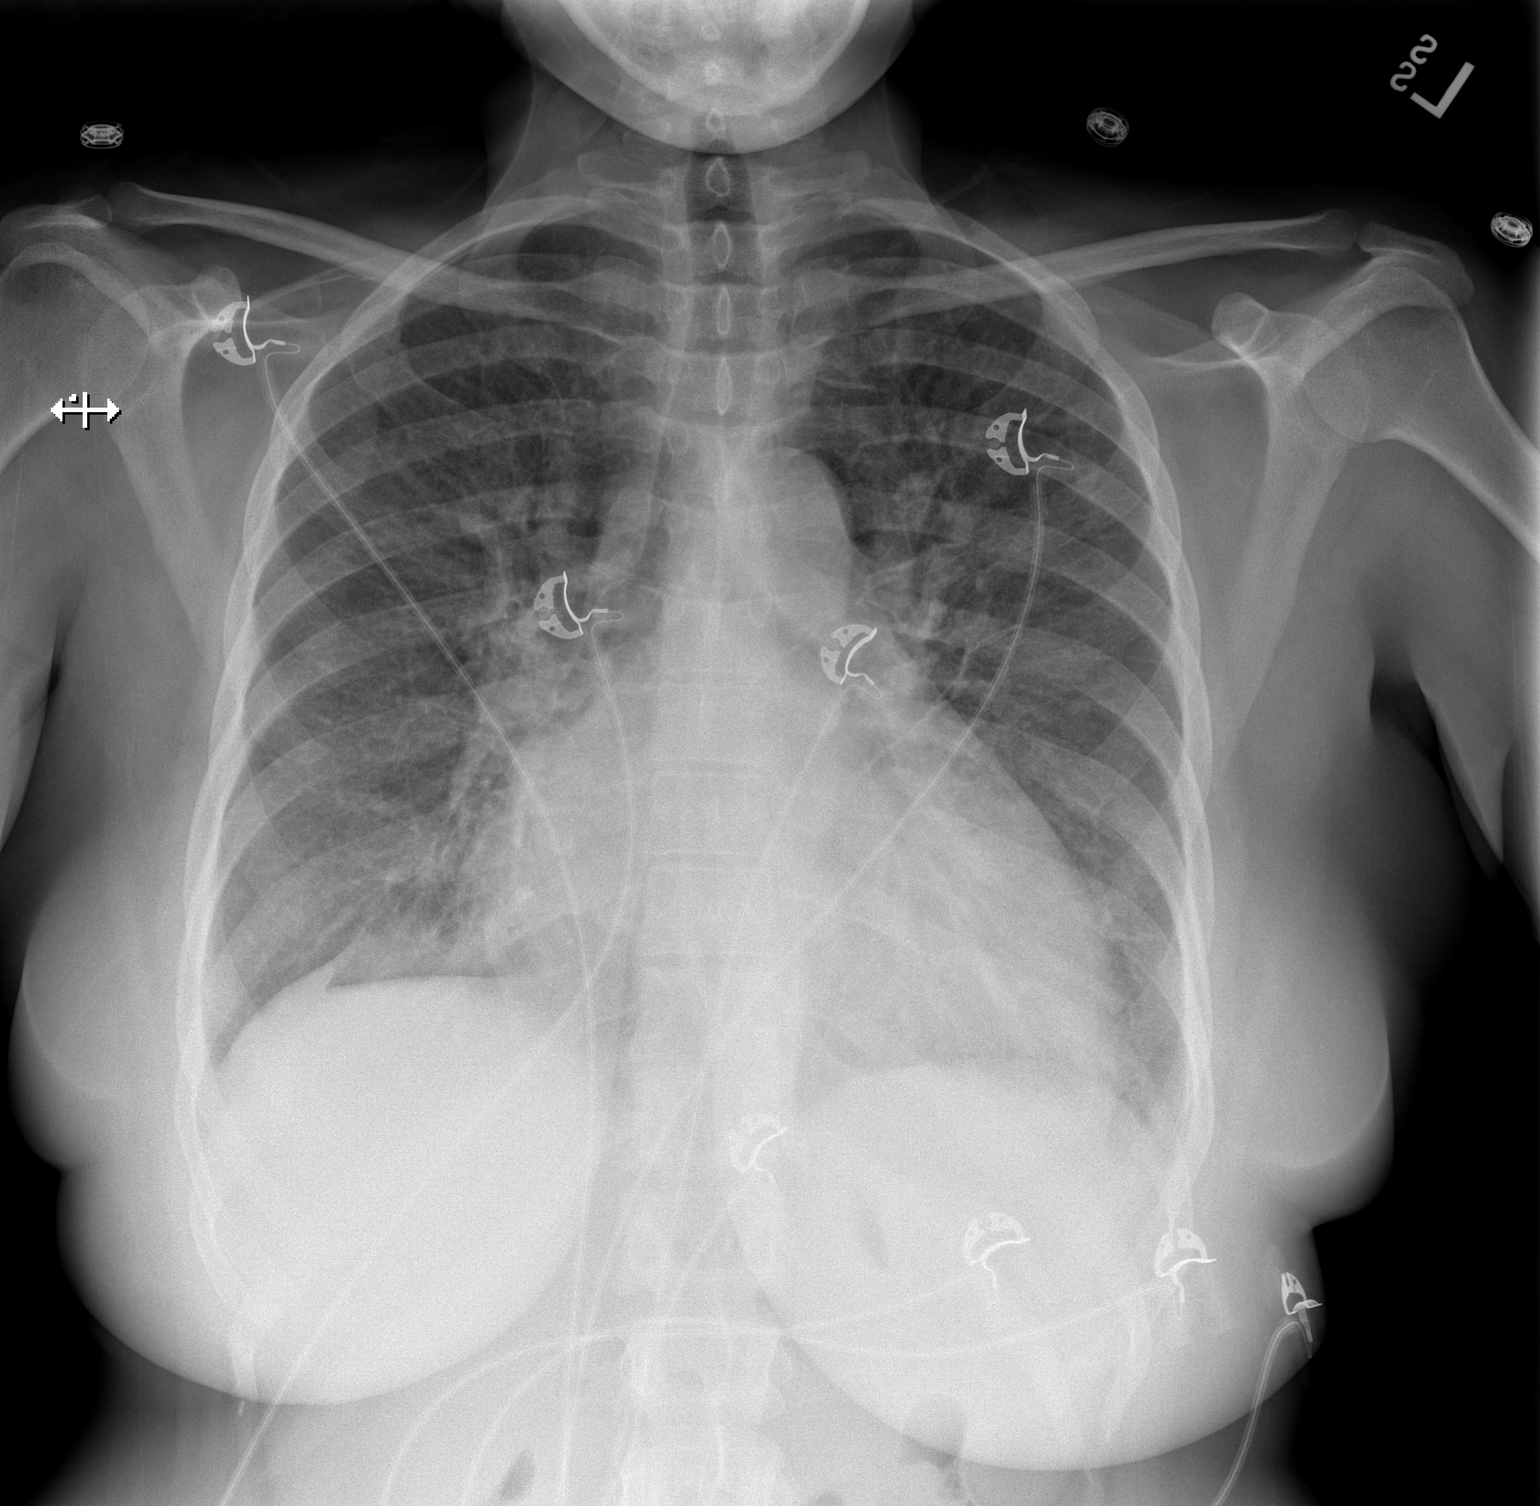

[w chest lat]
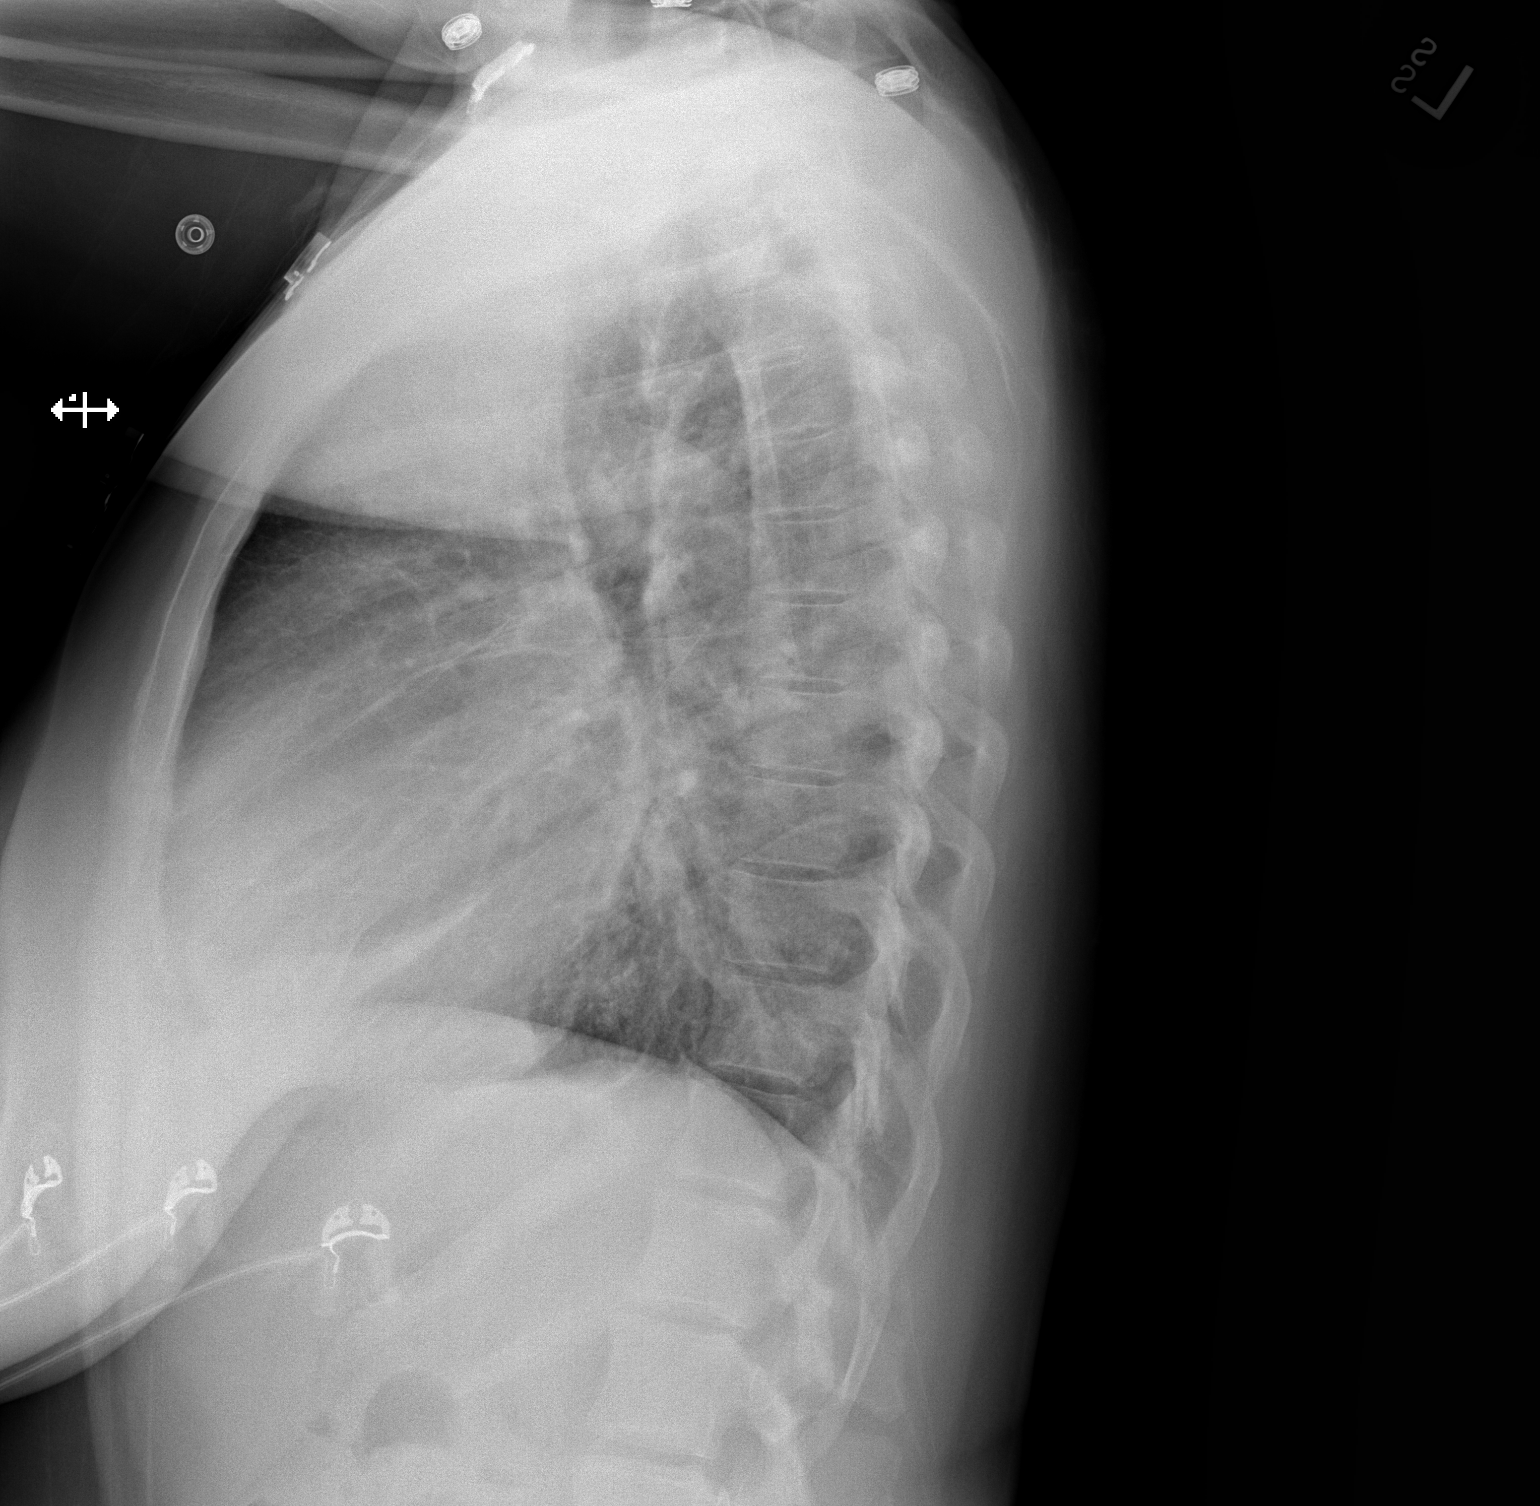

[2 of 2 positions shown; findings below may reference images not displayed]

FINDINGS: The heart is enlarged and stable since earlier chest film but
definitely enlarged since the prior chest x-ray from 02/14/2015.
There is interstitial pulmonary edema, peribronchial thickening and
streaky areas of atelectasis. No large pleural effusion. Small
amount of fluid is noted in the right major fissure. The bony thorax
is intact.
IMPRESSION: Cardiac enlargement, central vascular congestion and interstitial
pulmonary edema. No all pleural effusion or focal infiltrate.
